# Patient Record
Sex: Male | Born: 1945
Health system: Southern US, Community
[De-identification: ages and names within clinical notes are randomized; demographics above are authoritative.]

## PROBLEM LIST (undated history)

## (undated) DIAGNOSIS — E785 Hyperlipidemia, unspecified: Secondary | ICD-10-CM

## (undated) DIAGNOSIS — K219 Gastro-esophageal reflux disease without esophagitis: Secondary | ICD-10-CM

## (undated) DIAGNOSIS — G629 Polyneuropathy, unspecified: Secondary | ICD-10-CM

## (undated) DIAGNOSIS — M5431 Sciatica, right side: Secondary | ICD-10-CM

## (undated) DIAGNOSIS — A630 Anogenital (venereal) warts: Secondary | ICD-10-CM

## (undated) DIAGNOSIS — C44221 Squamous cell carcinoma of skin of unspecified ear and external auricular canal: Secondary | ICD-10-CM

## (undated) DIAGNOSIS — T7840XA Allergy, unspecified, initial encounter: Secondary | ICD-10-CM

## (undated) DIAGNOSIS — G4730 Sleep apnea, unspecified: Secondary | ICD-10-CM

## (undated) HISTORY — DX: Squamous cell carcinoma of skin of unspecified ear and external auricular canal: C44.221

## (undated) HISTORY — DX: Anogenital (venereal) warts: A63.0

## (undated) HISTORY — DX: Hyperlipidemia, unspecified: E78.5

## (undated) HISTORY — PX: TONSILLECTOMY: SUR1361

## (undated) HISTORY — DX: Polyneuropathy, unspecified: G62.9

## (undated) HISTORY — DX: Gastro-esophageal reflux disease without esophagitis: K21.9

## (undated) HISTORY — DX: Sleep apnea, unspecified: G47.30

## (undated) HISTORY — DX: Allergy, unspecified, initial encounter: T78.40XA

## (undated) HISTORY — DX: Sciatica, right side: M54.31

---

## 2002-09-13 HISTORY — PX: LUMBAR DISC SURGERY: SHX700

## 2003-05-10 ENCOUNTER — Encounter: Admission: RE | Admit: 2003-05-10 | Discharge: 2003-05-10 | Payer: Self-pay | Admitting: Orthopedic Surgery

## 2003-05-10 ENCOUNTER — Encounter: Payer: Self-pay | Admitting: Orthopedic Surgery

## 2003-05-10 ENCOUNTER — Encounter: Payer: Self-pay | Admitting: Radiology

## 2003-05-24 ENCOUNTER — Encounter: Payer: Self-pay | Admitting: Orthopedic Surgery

## 2003-05-24 ENCOUNTER — Encounter: Admission: RE | Admit: 2003-05-24 | Discharge: 2003-05-24 | Payer: Self-pay | Admitting: Orthopedic Surgery

## 2003-06-21 ENCOUNTER — Inpatient Hospital Stay (HOSPITAL_COMMUNITY): Admission: RE | Admit: 2003-06-21 | Discharge: 2003-06-22 | Payer: Self-pay | Admitting: Orthopaedic Surgery

## 2005-06-15 DIAGNOSIS — D099 Carcinoma in situ, unspecified: Secondary | ICD-10-CM

## 2005-06-15 HISTORY — DX: Carcinoma in situ, unspecified: D09.9

## 2005-07-02 ENCOUNTER — Ambulatory Visit: Payer: Self-pay | Admitting: Internal Medicine

## 2005-07-12 ENCOUNTER — Ambulatory Visit: Payer: Self-pay | Admitting: Internal Medicine

## 2005-07-28 ENCOUNTER — Ambulatory Visit: Payer: Self-pay | Admitting: Gastroenterology

## 2005-08-12 ENCOUNTER — Ambulatory Visit: Payer: Self-pay | Admitting: Gastroenterology

## 2005-08-12 HISTORY — PX: COLONOSCOPY: SHX174

## 2005-08-16 ENCOUNTER — Ambulatory Visit: Payer: Self-pay | Admitting: Internal Medicine

## 2006-10-12 ENCOUNTER — Ambulatory Visit: Payer: Self-pay | Admitting: Internal Medicine

## 2006-10-12 LAB — CONVERTED CEMR LAB
ALT: 31 units/L (ref 0–40)
AST: 25 units/L (ref 0–37)
Albumin: 4.1 g/dL (ref 3.5–5.2)
Alkaline Phosphatase: 44 units/L (ref 39–117)
BUN: 10 mg/dL (ref 6–23)
Basophils Absolute: 0.1 10*3/uL (ref 0.0–0.1)
Basophils Relative: 1.8 % — ABNORMAL HIGH (ref 0.0–1.0)
Bilirubin Urine: NEGATIVE
Bilirubin, Direct: 0.1 mg/dL (ref 0.0–0.3)
CO2: 26 meq/L (ref 19–32)
Calcium: 9.4 mg/dL (ref 8.4–10.5)
Chloride: 105 meq/L (ref 96–112)
Cholesterol: 196 mg/dL (ref 0–200)
Creatinine, Ser: 0.9 mg/dL (ref 0.4–1.5)
Direct LDL: 74.1 mg/dL
Eosinophils Absolute: 0.3 10*3/uL (ref 0.0–0.6)
Eosinophils Relative: 5.3 % — ABNORMAL HIGH (ref 0.0–5.0)
GFR calc Af Amer: 111 mL/min
GFR calc non Af Amer: 92 mL/min
Glucose, Bld: 102 mg/dL — ABNORMAL HIGH (ref 70–99)
HCT: 43.1 % (ref 39.0–52.0)
HDL: 59.6 mg/dL (ref >39.0)
Hemoglobin, Urine: NEGATIVE
Hemoglobin: 15.3 g/dL (ref 13.0–17.0)
Ketones, ur: NEGATIVE mg/dL
Leukocytes, UA: NEGATIVE
Lymphocytes Relative: 32.9 % (ref 12.0–46.0)
MCHC: 35.5 g/dL (ref 30.0–36.0)
MCV: 96.2 fL (ref 78.0–100.0)
Monocytes Absolute: 0.8 10*3/uL — ABNORMAL HIGH (ref 0.2–0.7)
Monocytes Relative: 15.2 % — ABNORMAL HIGH (ref 3.0–11.0)
Neutro Abs: 2.4 10*3/uL (ref 1.4–7.7)
Neutrophils Relative %: 44.8 % (ref 43.0–77.0)
Nitrite: NEGATIVE
PSA: 0.92 ng/mL (ref 0.10–4.00)
Platelets: 287 10*3/uL (ref 150–400)
Potassium: 4.3 meq/L (ref 3.5–5.1)
RBC: 4.48 M/uL (ref 4.22–5.81)
RDW: 12.3 % (ref 11.5–14.6)
Sodium: 138 meq/L (ref 135–145)
Specific Gravity, Urine: 1.025 (ref 1.000–1.03)
TSH: 1.94 microintl units/mL (ref 0.35–5.50)
Total Bilirubin: 0.4 mg/dL (ref 0.3–1.2)
Total CHOL/HDL Ratio: 3.3
Total Protein, Urine: NEGATIVE mg/dL
Total Protein: 6.9 g/dL (ref 6.0–8.3)
Triglycerides: 449 mg/dL (ref 0–149)
Urine Glucose: NEGATIVE mg/dL
Urobilinogen, UA: 0.2 (ref 0.0–1.0)
VLDL: 90 mg/dL — ABNORMAL HIGH (ref 0–40)
WBC: 5.3 10*3/uL (ref 4.5–10.5)
pH: 6 (ref 5.0–8.0)

## 2006-10-19 ENCOUNTER — Ambulatory Visit: Payer: Self-pay | Admitting: Internal Medicine

## 2006-11-01 ENCOUNTER — Ambulatory Visit: Payer: Self-pay | Admitting: Internal Medicine

## 2006-11-01 LAB — CONVERTED CEMR LAB
Cholesterol: 173 mg/dL (ref 0–200)
HDL: 54.5 mg/dL (ref >39.0)
LDL Cholesterol: 89 mg/dL (ref 0–99)
Total CHOL/HDL Ratio: 3.2
Triglycerides: 148 mg/dL (ref 0–149)
VLDL: 30 mg/dL (ref 0–40)

## 2007-10-17 ENCOUNTER — Ambulatory Visit: Payer: Self-pay | Admitting: Internal Medicine

## 2007-10-17 LAB — CONVERTED CEMR LAB
ALT: 61 units/L — ABNORMAL HIGH (ref 0–53)
AST: 44 units/L — ABNORMAL HIGH (ref 0–37)
Albumin: 4.2 g/dL (ref 3.5–5.2)
Alkaline Phosphatase: 49 units/L (ref 39–117)
BUN: 10 mg/dL (ref 6–23)
Basophils Absolute: 0.1 10*3/uL (ref 0.0–0.1)
Basophils Relative: 0.8 % (ref 0.0–1.0)
Bilirubin Urine: NEGATIVE
Bilirubin, Direct: 0.3 mg/dL (ref 0.0–0.3)
CO2: 30 meq/L (ref 19–32)
Calcium: 9.5 mg/dL (ref 8.4–10.5)
Chloride: 102 meq/L (ref 96–112)
Cholesterol: 175 mg/dL (ref 0–200)
Creatinine, Ser: 1 mg/dL (ref 0.4–1.5)
Direct LDL: 83.6 mg/dL
Eosinophils Absolute: 0.4 10*3/uL (ref 0.0–0.6)
Eosinophils Relative: 5.7 % — ABNORMAL HIGH (ref 0.0–5.0)
GFR calc Af Amer: 98 mL/min
GFR calc non Af Amer: 81 mL/min
Glucose, Bld: 103 mg/dL — ABNORMAL HIGH (ref 70–99)
HCT: 43.4 % (ref 39.0–52.0)
HDL: 62.7 mg/dL (ref >39.0)
Hemoglobin, Urine: NEGATIVE
Hemoglobin: 14.8 g/dL (ref 13.0–17.0)
Ketones, ur: NEGATIVE mg/dL
Leukocytes, UA: NEGATIVE
Lymphocytes Relative: 27.8 % (ref 12.0–46.0)
MCHC: 34 g/dL (ref 30.0–36.0)
MCV: 97.6 fL (ref 78.0–100.0)
Monocytes Absolute: 0.9 10*3/uL — ABNORMAL HIGH (ref 0.2–0.7)
Monocytes Relative: 13.4 % — ABNORMAL HIGH (ref 3.0–11.0)
Neutro Abs: 3.7 10*3/uL (ref 1.4–7.7)
Neutrophils Relative %: 52.3 % (ref 43.0–77.0)
Nitrite: NEGATIVE
PSA: 0.87 ng/mL (ref 0.10–4.00)
Platelets: 332 10*3/uL (ref 150–400)
Potassium: 4.5 meq/L (ref 3.5–5.1)
RBC: 4.45 M/uL (ref 4.22–5.81)
RDW: 11.9 % (ref 11.5–14.6)
Sodium: 138 meq/L (ref 135–145)
Specific Gravity, Urine: 1.015 (ref 1.000–1.03)
TSH: 2.51 microintl units/mL (ref 0.35–5.50)
Total Bilirubin: 1.4 mg/dL — ABNORMAL HIGH (ref 0.3–1.2)
Total CHOL/HDL Ratio: 2.8
Total Protein, Urine: NEGATIVE mg/dL
Total Protein: 7 g/dL (ref 6.0–8.3)
Triglycerides: 201 mg/dL (ref 0–149)
Urine Glucose: NEGATIVE mg/dL
Urobilinogen, UA: 0.2 (ref 0.0–1.0)
VLDL: 40 mg/dL (ref 0–40)
WBC: 7 10*3/uL (ref 4.5–10.5)
pH: 7 (ref 5.0–8.0)

## 2007-10-24 ENCOUNTER — Ambulatory Visit: Payer: Self-pay | Admitting: Internal Medicine

## 2007-10-24 DIAGNOSIS — J309 Allergic rhinitis, unspecified: Secondary | ICD-10-CM

## 2007-10-24 DIAGNOSIS — E785 Hyperlipidemia, unspecified: Secondary | ICD-10-CM | POA: Insufficient documentation

## 2008-10-16 ENCOUNTER — Ambulatory Visit: Payer: Self-pay | Admitting: Internal Medicine

## 2008-10-16 LAB — CONVERTED CEMR LAB
ALT: 50 units/L (ref 0–53)
AST: 36 units/L (ref 0–37)
Albumin: 4 g/dL (ref 3.5–5.2)
Alkaline Phosphatase: 51 units/L (ref 39–117)
BUN: 10 mg/dL (ref 6–23)
Basophils Absolute: 0.1 10*3/uL (ref 0.0–0.1)
Basophils Relative: 1.2 % (ref 0.0–3.0)
Bilirubin Urine: NEGATIVE
Bilirubin, Direct: 0.2 mg/dL (ref 0.0–0.3)
CO2: 30 meq/L (ref 19–32)
Calcium: 9.3 mg/dL (ref 8.4–10.5)
Chloride: 105 meq/L (ref 96–112)
Cholesterol: 149 mg/dL (ref 0–200)
Creatinine, Ser: 0.9 mg/dL (ref 0.4–1.5)
Eosinophils Absolute: 0.3 10*3/uL (ref 0.0–0.7)
Eosinophils Relative: 4.6 % (ref 0.0–5.0)
GFR calc Af Amer: 110 mL/min
GFR calc non Af Amer: 91 mL/min
Glucose, Bld: 98 mg/dL (ref 70–99)
HCT: 43.9 % (ref 39.0–52.0)
HDL: 60.8 mg/dL (ref >39.0)
Hemoglobin, Urine: NEGATIVE
Hemoglobin: 15.3 g/dL (ref 13.0–17.0)
Ketones, ur: NEGATIVE mg/dL
LDL Cholesterol: 70 mg/dL (ref 0–99)
Leukocytes, UA: NEGATIVE
Lymphocytes Relative: 24.9 % (ref 12.0–46.0)
MCHC: 34.7 g/dL (ref 30.0–36.0)
MCV: 98.4 fL (ref 78.0–100.0)
Monocytes Absolute: 0.8 10*3/uL (ref 0.1–1.0)
Monocytes Relative: 12.9 % — ABNORMAL HIGH (ref 3.0–12.0)
Neutro Abs: 3.7 10*3/uL (ref 1.4–7.7)
Neutrophils Relative %: 56.4 % (ref 43.0–77.0)
Nitrite: NEGATIVE
PSA: 0.8 ng/mL (ref 0.10–4.00)
Platelets: 284 10*3/uL (ref 150–400)
Potassium: 4.5 meq/L (ref 3.5–5.1)
RBC: 4.47 M/uL (ref 4.22–5.81)
RDW: 12.4 % (ref 11.5–14.6)
Sodium: 140 meq/L (ref 135–145)
Specific Gravity, Urine: 1.01 (ref 1.000–1.03)
TSH: 2.9 microintl units/mL (ref 0.35–5.50)
Total Bilirubin: 1 mg/dL (ref 0.3–1.2)
Total CHOL/HDL Ratio: 2.5
Total Protein, Urine: NEGATIVE mg/dL
Total Protein: 6.5 g/dL (ref 6.0–8.3)
Triglycerides: 92 mg/dL (ref 0–149)
Urine Glucose: NEGATIVE mg/dL
Urobilinogen, UA: 0.2 (ref 0.0–1.0)
VLDL: 18 mg/dL (ref 0–40)
WBC: 6.5 10*3/uL (ref 4.5–10.5)
pH: 6.5 (ref 5.0–8.0)

## 2008-10-24 ENCOUNTER — Ambulatory Visit: Payer: Self-pay | Admitting: Internal Medicine

## 2010-01-13 ENCOUNTER — Ambulatory Visit: Payer: Self-pay | Admitting: Internal Medicine

## 2010-01-13 LAB — CONVERTED CEMR LAB
ALT: 108 units/L — ABNORMAL HIGH (ref 0–53)
AST: 66 units/L — ABNORMAL HIGH (ref 0–37)
Albumin: 4 g/dL (ref 3.5–5.2)
Alkaline Phosphatase: 61 units/L (ref 39–117)
BUN: 12 mg/dL (ref 6–23)
Basophils Absolute: 0.1 10*3/uL (ref 0.0–0.1)
Basophils Relative: 0.5 % (ref 0.0–3.0)
Bilirubin Urine: NEGATIVE
Bilirubin, Direct: 0.2 mg/dL (ref 0.0–0.3)
CO2: 29 meq/L (ref 19–32)
Calcium: 9.4 mg/dL (ref 8.4–10.5)
Chloride: 103 meq/L (ref 96–112)
Cholesterol: 168 mg/dL (ref 0–200)
Creatinine, Ser: 0.8 mg/dL (ref 0.4–1.5)
Eosinophils Absolute: 0.1 10*3/uL (ref 0.0–0.7)
Eosinophils Relative: 0.7 % (ref 0.0–5.0)
GFR calc non Af Amer: 103.95 mL/min (ref >60)
Glucose, Bld: 85 mg/dL (ref 70–99)
HCT: 44.3 % (ref 39.0–52.0)
HDL: 71.9 mg/dL (ref >39.00)
Hemoglobin, Urine: NEGATIVE
Hemoglobin: 15.3 g/dL (ref 13.0–17.0)
Ketones, ur: NEGATIVE mg/dL
LDL Cholesterol: 69 mg/dL (ref 0–99)
Leukocytes, UA: NEGATIVE
Lymphocytes Relative: 17.2 % (ref 12.0–46.0)
Lymphs Abs: 2.1 10*3/uL (ref 0.7–4.0)
MCHC: 34.5 g/dL (ref 30.0–36.0)
MCV: 98.9 fL (ref 78.0–100.0)
Monocytes Absolute: 1.2 10*3/uL — ABNORMAL HIGH (ref 0.1–1.0)
Monocytes Relative: 9.7 % (ref 3.0–12.0)
Neutro Abs: 8.9 10*3/uL — ABNORMAL HIGH (ref 1.4–7.7)
Neutrophils Relative %: 71.9 % (ref 43.0–77.0)
Nitrite: NEGATIVE
PSA: 0.9 ng/mL (ref 0.10–4.00)
Platelets: 308 10*3/uL (ref 150.0–400.0)
Potassium: 4.4 meq/L (ref 3.5–5.1)
RBC: 4.49 M/uL (ref 4.22–5.81)
RDW: 13.8 % (ref 11.5–14.6)
Sodium: 139 meq/L (ref 135–145)
Specific Gravity, Urine: 1.02 (ref 1.000–1.030)
TSH: 1.57 microintl units/mL (ref 0.35–5.50)
Total Bilirubin: 0.9 mg/dL (ref 0.3–1.2)
Total CHOL/HDL Ratio: 2
Total Protein, Urine: NEGATIVE mg/dL
Total Protein: 6.6 g/dL (ref 6.0–8.3)
Triglycerides: 137 mg/dL (ref 0.0–149.0)
Urine Glucose: NEGATIVE mg/dL
Urobilinogen, UA: 0.2 (ref 0.0–1.0)
VLDL: 27.4 mg/dL (ref 0.0–40.0)
WBC: 12.4 10*3/uL — ABNORMAL HIGH (ref 4.5–10.5)
pH: 6 (ref 5.0–8.0)

## 2010-01-19 ENCOUNTER — Ambulatory Visit: Payer: Self-pay | Admitting: Internal Medicine

## 2010-06-04 ENCOUNTER — Telehealth: Payer: Self-pay | Admitting: Internal Medicine

## 2010-06-04 DIAGNOSIS — G4733 Obstructive sleep apnea (adult) (pediatric): Secondary | ICD-10-CM

## 2010-09-29 ENCOUNTER — Encounter: Payer: Self-pay | Admitting: Internal Medicine

## 2010-10-13 NOTE — Assessment & Plan Note (Signed)
Summary: CPX/BCBS/#/CD   Vital Signs:  Patient profile:   65 year old male Height:      68 inches Weight:      154.50 pounds BMI:     23.58 O2 Sat:      97 % on Room air Temp:     98.4 degrees F oral Pulse rate:   67 / minute BP sitting:   132 / 84  (left arm) Cuff size:   regular  Vitals Entered ByZella Ball Ewing (Jan 19, 2010 10:47 AM)  O2 Flow:  Room air  CC: Adult Physical/RE   CC:  Adult Physical/RE.  History of Present Illness: overall doing well, did have episode of bronchitis tx with zpack per allergy (eugene Simms MD) now much improved with slight residual nonprod cough only ; no productivity, wheezing, and Pt denies CP, sob, doe, wheezing, orthopnea, pnd, worsening LE edema, palps, dizziness or syncope   Pt denies new neuro symptoms such as headache, facial or extremity weakness   Does have still recurrent right sciatica on occasion but not worse in severity or freq.    Problems Prior to Update: 1)  Preventive Health Care  (ICD-V70.0) 2)  Allergic Rhinitis  (ICD-477.9) 3)  Preventive Health Care  (ICD-V70.0) 4)  Hyperlipidemia  (ICD-272.4) 5)  Special Screening Malignant Neoplasm of Prostate  (ICD-V76.44) 6)  Routine General Medical Exam@health  Care Facl  (ICD-V70.0)  Medications Prior to Update: 1)  Neurontin 300 Mg  Caps (Gabapentin) .Marland Kitchen.. 1 By Mouth Three Times A Day 2)  Crestor 40 Mg  Tabs (Rosuvastatin Calcium) .... 1/2 By Mouth Once Daily 3)  Ecotrin Low Strength 81 Mg  Tbec (Aspirin) .... Take 1 Tablet By Mouth Once A Day  Current Medications (verified): 1)  Neurontin 300 Mg  Caps (Gabapentin) .Marland Kitchen.. 1 By Mouth Three Times A Day 2)  Crestor 40 Mg  Tabs (Rosuvastatin Calcium) .... 1/2 By Mouth Once Daily 3)  Ecotrin Low Strength 81 Mg  Tbec (Aspirin) .... Take 1 Tablet By Mouth Once A Day  Allergies (verified): No Known Drug Allergies  Past History:  Past Medical History: Last updated: 10/24/2007 recurrent right sciatica Hyperlipidemia genital  warts hs of squamous cell skin ca right ear Allergic rhinitis  Past Surgical History: Last updated: 10/24/2007 s/p lumbar disc surgury 2004  Family History: Last updated: 10/24/2007 father and brother with high cholesterol mother with lung cancer  Social History: Last updated: 10/24/2007 Single no children work - Community education officer Current Smoker Alcohol use-yes - ? excessive  Risk Factors: Smoking Status: current (10/24/2007)  Review of Systems  The patient denies anorexia, fever, vision loss, decreased hearing, hoarseness, chest pain, syncope, dyspnea on exertion, peripheral edema, prolonged cough, headaches, hemoptysis, abdominal pain, melena, hematochezia, severe indigestion/heartburn, hematuria, incontinence, genital sores, muscle weakness, suspicious skin lesions, difficulty walking, depression, unusual weight change, abnormal bleeding, enlarged lymph nodes, and angioedema.         all otherwise negative per pt -   - only once nocturia   Physical Exam  General:  alert and well-developed.   Head:  normocephalic and atraumatic.   Eyes:  vision grossly intact, pupils equal, and pupils round.   Ears:  R ear normal and L ear normal.   Nose:  no external deformity and no nasal discharge.   Mouth:  no gingival abnormalities and pharynx pink and moist.   Neck:  supple and no masses.   Lungs:  normal respiratory effort and normal breath sounds.   Heart:  normal  rate and regular rhythm.   Abdomen:  soft, non-tender, and normal bowel sounds.   Msk:  no joint tenderness and no joint swelling.   Extremities:  no edema, no erythema  Neurologic:  cranial nerves II-XII intact and strength normal in all extremities.     Impression & Recommendations:  Problem # 1:  Preventive Health Care (ICD-V70.0) Overall doing well, age appropriate education and counseling updated and referral for appropriate preventive services done unless declined, immunizations up to date or declined, diet  counseling done if overweight, urged to quit smoking if smokes , most recent labs reviewed and current ordered if appropriate, ecg reviewed or declined (interpretation per ECG scanned in the EMR if done); information regarding Medicare Prevention requirements given if appropriate; speciality referrals updated as appropriate  Orders: EKG w/ Interpretation (93000)  Complete Medication List: 1)  Neurontin 300 Mg Caps (Gabapentin) .Marland Kitchen.. 1 by mouth three times a day 2)  Crestor 40 Mg Tabs (Rosuvastatin calcium) .... 1/2 by mouth once daily 3)  Ecotrin Low Strength 81 Mg Tbec (Aspirin) .... Take 1 tablet by mouth once a day  Other Orders: Zoster (Shingles) Vaccine Live [phone]) Admin 1st Vaccine (98119)  Patient Instructions: 1)  you had the shingles shot today 2)  Continue all previous medications as before this visit  3)  Please schedule a follow-up appointment in 1 year or sooner if needed Prescriptions: CRESTOR 40 MG  TABS (ROSUVASTATIN CALCIUM) 1 by mouth once daily  #90 x 3   Entered and Authorized by:   Corwin Levins MD   Signed by:   Corwin Levins MD on 01/19/2010   Method used:   Print then Give to Patient   RxID:   [id number] NEURONTIN 300 MG  CAPS (GABAPENTIN) 1 by mouth three times a day  #270 x 3   Entered and Authorized by:   Corwin Levins MD   Signed by:   Corwin Levins MD on 01/19/2010   Method used:   Print then Give to Patient   RxID:   [id number]    Immunizations Administered:  Zostavax # 0:    Vaccine Type: Zostavax    Site: left deltoid    Mfr: Merck    Dose: 0.5 ml    Route: [place]    Given by: Zella Ball Ewing    Exp. Date: 03/2011    Lot #: [id number]    VIS given: 06/25/05 given Jan 19, 2010.    Immunizations Administered:  Zostavax # 0:    Vaccine Type: Zostavax    Site: left deltoid    Mfr: Merck    Dose: 0.5 ml    Route: [place]    Given by: Robin Ewing    Exp. Date: 03/2011    Lot #: [id number]    VIS given: 06/25/05 given Jan 19, 2010.

## 2010-10-13 NOTE — Progress Notes (Signed)
Summary: CPAP request  Phone Note Call from Patient Call back at Home Phone [phone]   Caller: Patient Call For: Corwin Levins MD Summary of Call: Pt is requesting for Dr. Jonny Ruiz to prescribe CPAP. Pt stated that Dr. Corinda Gubler used to prescibed CPAP for him, but now Dr. Corinda Gubler advice him to get his PCP to prescribe CPAP. Please advice. Initial call taken by: Livingston Diones,  June 04, 2010 1:39 PM  Follow-up for Phone Call        I have never prescribed CPAP in the past. Can we refer him to pulm so that proper eval and tx is continued, since it seems Dr Corinda Gubler no longer wants to be involved ? Follow-up by: Corwin Levins MD,  June 04, 2010 5:37 PM  Additional Follow-up for Phone Call Additional follow up Details #1::        Pt advised to call back if referral to pulm is an option. Margaret Pyle, CMA  June 05, 2010 8:24 AM   Per pt's spouse he would like to be referred to [facility] Additional Follow-up by: Margaret Pyle, CMA,  June 09, 2010 8:21 AM  New Problems: OBSTRUCTIVE SLEEP APNEA (ICD-327.23)   Additional Follow-up for Phone Call Additional follow up Details #2::    ok , will do Follow-up by: Corwin Levins MD,  June 09, 2010 12:28 PM  New Problems: OBSTRUCTIVE SLEEP APNEA (ICD-327.23)

## 2010-10-15 NOTE — Consult Note (Signed)
Summary: [facility]  [facility]   Imported By: Lennie Odor 10/07/2010 15:10:07  _____________________________________________________________________  External Attachment:    Type:   Image     Comment:   External Document

## 2011-01-25 ENCOUNTER — Other Ambulatory Visit: Payer: Self-pay

## 2011-01-25 ENCOUNTER — Other Ambulatory Visit: Payer: Self-pay | Admitting: Internal Medicine

## 2011-01-25 DIAGNOSIS — Z0000 Encounter for general adult medical examination without abnormal findings: Secondary | ICD-10-CM

## 2011-01-25 DIAGNOSIS — Z0389 Encounter for observation for other suspected diseases and conditions ruled out: Secondary | ICD-10-CM

## 2011-01-29 ENCOUNTER — Ambulatory Visit: Payer: Self-pay | Admitting: Internal Medicine

## 2011-01-29 NOTE — Op Note (Signed)
   NAME:  Manuel Cruz, Manuel Cruz                           ACCOUNT NO.:  [id number]   MEDICAL RECORD NO.:  [id number]                   PATIENT TYPE:  INP   LOCATION:  2550                                 FACILITY:  [facility]   PHYSICIAN:  Mark C. Ophelia Charter, M.D.                 DATE OF BIRTH:  Jan 07, 1946   DATE OF PROCEDURE:  06/21/2003  DATE OF DISCHARGE:                                 OPERATIVE REPORT   PREOPERATIVE DIAGNOSIS:  Right L4-L5 herniated nucleus pulposus.   POSTOPERATIVE DIAGNOSIS:  Right L4-L5 herniated nucleus pulposus.   PROCEDURE:  Right L4-L5 microdiscectomy.   SURGEON:  Mark C. Ophelia Charter, M.D.   ASSISTANT:  Genene Churn. Denton Meek.   ANESTHESIA:  GOT plus 8 mL of Marcaine to the skin locally.   ESTIMATED BLOOD LOSS:  Minimal.   PROCEDURE:  After induction of general anesthesia with orotracheal  intubation, the patient was placed on the Andrews frame with careful padding  and positioning.  The back was prepped with DuraPrep, the area was scrubbed  with towels, Betadine and Vi-Drape were applied, and laminectomy sheath.  Cross table x-ray was taken with the needle 2-3 mm above the 4-5 disc space.  An incision was made a few mm to the right of the midline at L4-L5.  Taylor  retractors were placed laterally.  The inferior aspect of the lamina was  cleaned of soft tissue with cup curet.  A 3 mm Kerrison was used for  performing a laminotomy.  The lateral gutter had spurs removed.  Thick  ligaments were removed.  The nerve root was directly visualized and  displaced and had slightly higher take off than normal.  The nerve root was  gently retracted over the bulging disc that was more right than central.  The annulus was incised and pieces of disc were removed.  Some degenerative  pieces had slid inferior from the annulus distal to the L5 body and this was  removed.  An Epstein curet was used as well as a hockey stick and after the  degenerative disc material was removed, the hockey  stick was passed 180  degrees free sweep anterior to the dura.  The foramina was enlarged and was  free.  After irrigation with saline solution, the fascia was closed with 0  Vicryl, 2-0 Vicryl in the subcutaneous tissue, skin staple closure.  Marcaine infiltration and transfer to the recovery room.  Instrument count  and needle counts were correct.                                               Mark C. Ophelia Charter, M.D.    MCY/MEDQ  D:  06/21/2003  T:  06/21/2003  Job:  [id number]

## 2011-03-01 ENCOUNTER — Other Ambulatory Visit (INDEPENDENT_AMBULATORY_CARE_PROVIDER_SITE_OTHER): Payer: Self-pay

## 2011-03-01 DIAGNOSIS — Z0000 Encounter for general adult medical examination without abnormal findings: Secondary | ICD-10-CM

## 2011-03-01 DIAGNOSIS — Z0389 Encounter for observation for other suspected diseases and conditions ruled out: Secondary | ICD-10-CM

## 2011-03-01 LAB — LIPID PANEL
Total CHOL/HDL Ratio: 3
Triglycerides: 137 mg/dL (ref 0.0–149.0)

## 2011-03-01 LAB — URINALYSIS
Leukocytes, UA: NEGATIVE
Nitrite: NEGATIVE
Specific Gravity, Urine: 1.025 (ref 1.000–1.030)
pH: 6 (ref 5.0–8.0)

## 2011-03-01 LAB — HEPATIC FUNCTION PANEL
Alkaline Phosphatase: 52 U/L (ref 39–117)
Bilirubin, Direct: 0.2 mg/dL (ref 0.0–0.3)

## 2011-03-01 LAB — CBC WITH DIFFERENTIAL/PLATELET
Basophils Relative: 1 % (ref 0.0–3.0)
Eosinophils Absolute: 0.5 10*3/uL (ref 0.0–0.7)
Hemoglobin: 16 g/dL (ref 13.0–17.0)
Lymphocytes Relative: 33.6 % (ref 12.0–46.0)
MCHC: 35 g/dL (ref 30.0–36.0)
MCV: 99.4 fl (ref 78.0–100.0)
Monocytes Absolute: 1 10*3/uL (ref 0.1–1.0)
Neutro Abs: 3.2 10*3/uL (ref 1.4–7.7)
RBC: 4.62 Mil/uL (ref 4.22–5.81)

## 2011-03-01 LAB — BASIC METABOLIC PANEL
CO2: 27 mEq/L (ref 19–32)
Calcium: 9 mg/dL (ref 8.4–10.5)
Creatinine, Ser: 0.7 mg/dL (ref 0.4–1.5)
Glucose, Bld: 91 mg/dL (ref 70–99)

## 2011-03-04 ENCOUNTER — Encounter: Payer: Self-pay | Admitting: Internal Medicine

## 2011-03-10 ENCOUNTER — Ambulatory Visit (INDEPENDENT_AMBULATORY_CARE_PROVIDER_SITE_OTHER): Payer: Commercial Managed Care - PPO | Admitting: Internal Medicine

## 2011-03-10 ENCOUNTER — Encounter: Payer: Self-pay | Admitting: Internal Medicine

## 2011-03-10 VITALS — BP 122/80 | HR 67 | Temp 98.2°F | Ht 68.0 in | Wt 153.2 lb

## 2011-03-10 DIAGNOSIS — Z0000 Encounter for general adult medical examination without abnormal findings: Secondary | ICD-10-CM | POA: Insufficient documentation

## 2011-03-10 DIAGNOSIS — Z79899 Other long term (current) drug therapy: Secondary | ICD-10-CM | POA: Insufficient documentation

## 2011-03-10 DIAGNOSIS — E785 Hyperlipidemia, unspecified: Secondary | ICD-10-CM

## 2011-03-10 MED ORDER — ATORVASTATIN CALCIUM 40 MG PO TABS: 40.0000 mg | ORAL_TABLET | Freq: Every day | ORAL | Status: DC

## 2011-03-10 NOTE — Assessment & Plan Note (Signed)
crestor too expensive - to change to lipitor 40 mg, f/u labs in 4 wks

## 2011-03-10 NOTE — Progress Notes (Signed)
Subjective:    Patient ID: Manuel Cruz, male    DOB: 02/14/1946, 65 y.o.   MRN: [id number]  HPI Here for wellness and f/u;  Overall doing ok;  Pt denies CP, worsening SOB, DOE, wheezing, orthopnea, PND, worsening LE edema, palpitations, dizziness or syncope.  Pt denies neurological change such as new Headache, facial or extremity weakness.  Pt denies polydipsia, polyuria, or low sugar symptoms. Pt states overall good compliance with treatment and medications, good tolerability, and trying to follow lower cholesterol diet.  Pt denies worsening depressive symptoms, suicidal ideation or panic. No fever, wt loss, night sweats, loss of appetite, or other constitutional symptoms.  Pt states good ability with ADL's, low fall risk, home safety reviewed and adequate, no significant changes in hearing or vision, and occasionally active with exercise.   Father died 27-Feb-2012with bladder ca and NHL at 64yo;  Was seeing Dr Darlyn Read for trach;  Pt himself with occasional ST, felt to be ? Reflux per ENT, now on omeprazole. Also saw derm twice in the past mo, with some keratosis to left side burn area, tx with Picato for 3 night tx only. Past Medical History  Diagnosis Date  . Hyperlipidemia   . Allergic rhinitis   . Sciatica of right side     recurrent  . Warts, genital   . Squamous cell skin cancer, earlobe    Past Surgical History  Procedure Date  . Lumbar disc surgery 2004    reports that he has been smoking.  He does not have any smokeless tobacco history on file. He reports that he drinks alcohol. His drug history not on file. family history includes Cancer in his mother and Hyperlipidemia in his brother and father. No Known Allergies Current Outpatient Prescriptions on File Prior to Visit  Medication Sig Dispense Refill  . aspirin 81 MG EC tablet Take 81 mg by mouth daily.        Marland Kitchen gabapentin (NEURONTIN) 300 MG capsule Take 300 mg by mouth 3 (three) times daily.         Review of Systems Review  of Systems  Constitutional: Negative for diaphoresis, activity change, appetite change and unexpected weight change.  HENT: Negative for hearing loss, ear pain, facial swelling, mouth sores and neck stiffness.   Eyes: Negative for pain, redness and visual disturbance.  Respiratory: Negative for shortness of breath and wheezing.   Cardiovascular: Negative for chest pain and palpitations.  Gastrointestinal: Negative for diarrhea, blood in stool, abdominal distention and rectal pain.  Genitourinary: Negative for hematuria, flank pain and decreased urine volume.  Musculoskeletal: Negative for myalgias and joint swelling.  Skin: Negative for color change and wound.  Neurological: Negative for syncope and numbness.  Hematological: Negative for adenopathy.  Psychiatric/Behavioral: Negative for hallucinations, self-injury, decreased concentration and agitation.      Objective:   Physical Exam BP 122/80  Pulse 67  Temp(Src) 98.2 F (36.8 C) (Oral)  Ht 5\' 8"  (1.727 m)  Wt 153 lb 4 oz (69.514 kg)  BMI 23.30 kg/m2  SpO2 98% Physical Exam  VS noted Constitutional: Pt is oriented to person, place, and time. Appears well-developed and well-nourished.  HENT:  Head: Normocephalic and atraumatic.  Right Ear: External ear normal.  Left Ear: External ear normal.  Nose: Nose normal.  Mouth/Throat: Oropharynx is clear and moist.  Eyes: Conjunctivae and EOM are normal. Pupils are equal, round, and reactive to light.  Neck: Normal range of motion. Neck supple. No JVD present. No  tracheal deviation present.  Cardiovascular: Normal rate, regular rhythm, normal heart sounds and intact distal pulses.   Pulmonary/Chest: Effort normal and breath sounds normal.  Abdominal: Soft. Bowel sounds are normal. There is no tenderness.  Musculoskeletal: Normal range of motion. Exhibits no edema.  Lymphadenopathy:  Has no cervical adenopathy.  Neurological: Pt is alert and oriented to person, place, and time. Pt has  normal reflexes. No cranial nerve deficit.  Skin: Skin is warm and dry. No rash noted.  Psychiatric:  Has  normal mood and affect. Behavior is normal.         Assessment & Plan:

## 2011-03-10 NOTE — Assessment & Plan Note (Signed)

## 2011-03-10 NOTE — Patient Instructions (Signed)
Please finish the crestor you have After that, change to the lipitor as prescribed Please return for LAB only, approx 4 weeks after starting the Lipitor (for cholesterol and liver tests) Please also return in 1 year for your yearly visit, or sooner if needed, with Lab testing done 3-5 days before

## 2011-03-17 ENCOUNTER — Encounter: Payer: Self-pay | Admitting: Internal Medicine

## 2011-03-25 ENCOUNTER — Other Ambulatory Visit: Payer: Self-pay | Admitting: Internal Medicine

## 2012-02-29 ENCOUNTER — Other Ambulatory Visit (INDEPENDENT_AMBULATORY_CARE_PROVIDER_SITE_OTHER): Payer: Commercial Managed Care - PPO

## 2012-02-29 DIAGNOSIS — Z0000 Encounter for general adult medical examination without abnormal findings: Secondary | ICD-10-CM

## 2012-02-29 LAB — LIPID PANEL
HDL: 67.8 mg/dL (ref >39.00)
VLDL: 31.4 mg/dL (ref 0.0–40.0)

## 2012-02-29 LAB — CBC WITH DIFFERENTIAL/PLATELET
Basophils Absolute: 0.1 10*3/uL (ref 0.0–0.1)
Eosinophils Absolute: 0.6 10*3/uL (ref 0.0–0.7)
Eosinophils Relative: 6.2 % — ABNORMAL HIGH (ref 0.0–5.0)
HCT: 45.1 % (ref 39.0–52.0)
Lymphs Abs: 2.9 10*3/uL (ref 0.7–4.0)
MCHC: 33.7 g/dL (ref 30.0–36.0)
MCV: 99.8 fl (ref 78.0–100.0)
Monocytes Absolute: 1.2 10*3/uL — ABNORMAL HIGH (ref 0.1–1.0)
Platelets: 258 10*3/uL (ref 150.0–400.0)
RDW: 13.5 % (ref 11.5–14.6)

## 2012-02-29 LAB — BASIC METABOLIC PANEL
GFR: 109.55 mL/min (ref >60.00)
Glucose, Bld: 94 mg/dL (ref 70–99)
Potassium: 4.6 mEq/L (ref 3.5–5.1)
Sodium: 140 mEq/L (ref 135–145)

## 2012-02-29 LAB — URINALYSIS, ROUTINE W REFLEX MICROSCOPIC
Hgb urine dipstick: NEGATIVE
Nitrite: NEGATIVE
Urobilinogen, UA: 0.2 (ref 0.0–1.0)

## 2012-02-29 LAB — HEPATIC FUNCTION PANEL
ALT: 53 U/L (ref 0–53)
Total Bilirubin: 0.9 mg/dL (ref 0.3–1.2)

## 2012-03-10 ENCOUNTER — Encounter: Payer: Self-pay | Admitting: Internal Medicine

## 2012-03-10 ENCOUNTER — Ambulatory Visit (INDEPENDENT_AMBULATORY_CARE_PROVIDER_SITE_OTHER): Payer: BC Managed Care – PPO | Admitting: Internal Medicine

## 2012-03-10 ENCOUNTER — Telehealth: Payer: Self-pay

## 2012-03-10 VITALS — BP 120/80 | HR 68 | Temp 97.3°F | Ht 68.0 in | Wt 154.1 lb

## 2012-03-10 DIAGNOSIS — Z0000 Encounter for general adult medical examination without abnormal findings: Secondary | ICD-10-CM

## 2012-03-10 DIAGNOSIS — E785 Hyperlipidemia, unspecified: Secondary | ICD-10-CM

## 2012-03-10 MED ORDER — OMEPRAZOLE 20 MG PO CPDR: 20.0000 mg | DELAYED_RELEASE_CAPSULE | Freq: Every day | ORAL | Status: DC

## 2012-03-10 MED ORDER — ATORVASTATIN CALCIUM 40 MG PO TABS: 40.0000 mg | ORAL_TABLET | Freq: Every day | ORAL | Status: DC

## 2012-03-10 MED ORDER — ROSUVASTATIN CALCIUM 20 MG PO TABS: 20.0000 mg | ORAL_TABLET | Freq: Every day | ORAL | Status: DC

## 2012-03-10 NOTE — Progress Notes (Signed)
Subjective:    Patient ID: Manuel Cruz, male    DOB: [birth year], 66 y.o.   MRN: [id number]  HPI    Here for wellness and f/u;  Overall doing ok;  Pt denies CP, worsening SOB, DOE, wheezing, orthopnea, PND, worsening LE edema, palpitations, dizziness or syncope.  Pt denies neurological change such as new Headache, facial or extremity weakness.  Pt denies polydipsia, polyuria, or low sugar symptoms. Pt states overall good compliance with treatment and medications, good tolerability, and trying to follow lower cholesterol diet.  Pt denies worsening depressive symptoms, suicidal ideation or panic. No fever, wt loss, night sweats, loss of appetite, or other constitutional symptoms.  Pt states good ability with ADL's, low fall risk, home safety reviewed and adequate, no significant changes in hearing or vision, and occasionally active with exercise.  Is still taking crestor instead of the lipitor as he had plenty of samples and rx. No acute complaints Past Medical History  Diagnosis Date  . Hyperlipidemia   . Allergic rhinitis   . Sciatica of right side     recurrent  . Warts, genital   . Squamous cell skin cancer, earlobe    Past Surgical History  Procedure Date  . Lumbar disc surgery 2004    reports that he has been smoking.  He does not have any smokeless tobacco history on file. He reports that he drinks alcohol. His drug history not on file. family history includes Cancer in his mother and Hyperlipidemia in his brother and father. No Known Allergies Current Outpatient Prescriptions on File Prior to Visit  Medication Sig Dispense Refill  . aspirin 81 MG EC tablet Take 81 mg by mouth daily.        Marland Kitchen gabapentin (NEURONTIN) 300 MG capsule TAKE 1 CAPSULE 3 TIMES A DAY  270 capsule  2  . atorvastatin (LIPITOR) 40 MG tablet Take 1 tablet (40 mg total) by mouth daily.  90 tablet  3   Review of Systems Review of Systems  Constitutional: Negative for diaphoresis, activity change, appetite change  and unexpected weight change.  HENT: Negative for hearing loss, ear pain, facial swelling, mouth sores and neck stiffness.   Eyes: Negative for pain, redness and visual disturbance.  Respiratory: Negative for shortness of breath and wheezing.   Cardiovascular: Negative for chest pain and palpitations.  Gastrointestinal: Negative for diarrhea, blood in stool, abdominal distention and rectal pain.  Genitourinary: Negative for hematuria, flank pain and decreased urine volume.  Musculoskeletal: Negative for myalgias and joint swelling.  Skin: Negative for color change and wound.  Neurological: Negative for syncope and numbness.  Hematological: Negative for adenopathy.  Psychiatric/Behavioral: Negative for hallucinations, self-injury, decreased concentration and agitation.      Objective:   Physical Exam BP 120/80  Pulse 68  Temp 97.3 F (36.3 C) (Oral)  Ht 5\' 8"  (1.727 m)  Wt 154 lb 2 oz (69.911 kg)  BMI 23.43 kg/m2  SpO2 98% Physical Exam  VS noted Constitutional: Pt is oriented to person, place, and time. Appears well-developed and well-nourished.  HENT:  Head: Normocephalic and atraumatic.  Right Ear: External ear normal.  Left Ear: External ear normal.  Nose: Nose normal.  Mouth/Throat: Oropharynx is clear and moist.  Eyes: Conjunctivae and EOM are normal. Pupils are equal, round, and reactive to light.  Neck: Normal range of motion. Neck supple. No JVD present. No tracheal deviation present.  Cardiovascular: Normal rate, regular rhythm, normal heart sounds and intact distal pulses.   Pulmonary/Chest:  Effort normal and breath sounds normal.  Abdominal: Soft. Bowel sounds are normal. There is no tenderness.  Musculoskeletal: Normal range of motion. Exhibits no edema.  Lymphadenopathy:  Has no cervical adenopathy.  Neurological: Pt is alert and oriented to person, place, and time. Pt has normal reflexes. No cranial nerve deficit. Motor/gait intact Skin: Skin is warm and dry. No  rash noted.  Psychiatric:  Has  normal mood and affect. Behavior is normal.     Assessment & Plan:

## 2012-03-10 NOTE — Patient Instructions (Addendum)
Continue all other medications as before Your refills were sent in You are otherwise up to date with prevention Please return in 1 year for your yearly visit, or sooner if needed, with Lab testing done 3-5 days before

## 2012-03-10 NOTE — Assessment & Plan Note (Signed)

## 2012-03-10 NOTE — Telephone Encounter (Signed)
Ok to change to lipitor 40 if ok with insurance

## 2012-03-10 NOTE — Telephone Encounter (Signed)
Received PA for Crestor.  Please advise to proceed or offer alternative

## 2012-03-11 ENCOUNTER — Encounter: Payer: Self-pay | Admitting: Internal Medicine

## 2012-03-13 NOTE — Telephone Encounter (Signed)
PATIENT INFORMED

## 2012-03-13 NOTE — Telephone Encounter (Signed)
Called the patient to inform--left message to call back.  

## 2012-06-04 ENCOUNTER — Other Ambulatory Visit: Payer: Self-pay | Admitting: Internal Medicine

## 2012-06-05 NOTE — Telephone Encounter (Signed)
Done per erx 

## 2012-07-14 ENCOUNTER — Other Ambulatory Visit: Payer: Self-pay

## 2012-07-14 DIAGNOSIS — E785 Hyperlipidemia, unspecified: Secondary | ICD-10-CM

## 2012-07-14 MED ORDER — OMEPRAZOLE 20 MG PO CPDR: 20.0000 mg | DELAYED_RELEASE_CAPSULE | Freq: Every day | ORAL | Status: DC

## 2012-07-14 MED ORDER — GABAPENTIN 300 MG PO CAPS: ORAL_CAPSULE | ORAL | Status: DC

## 2012-07-14 MED ORDER — ATORVASTATIN CALCIUM 40 MG PO TABS: 40.0000 mg | ORAL_TABLET | Freq: Every day | ORAL | Status: DC

## 2012-07-14 NOTE — Telephone Encounter (Signed)
Done erx 

## 2012-07-18 DIAGNOSIS — A499 Bacterial infection, unspecified: Secondary | ICD-10-CM | POA: Diagnosis not present

## 2012-07-18 DIAGNOSIS — L723 Sebaceous cyst: Secondary | ICD-10-CM | POA: Diagnosis not present

## 2012-11-30 ENCOUNTER — Other Ambulatory Visit: Payer: Self-pay

## 2012-11-30 MED ORDER — GABAPENTIN 300 MG PO CAPS: ORAL_CAPSULE | ORAL | Status: DC

## 2012-11-30 NOTE — Telephone Encounter (Signed)
Done erx 

## 2013-01-09 DIAGNOSIS — H2510 Age-related nuclear cataract, unspecified eye: Secondary | ICD-10-CM | POA: Diagnosis not present

## 2013-03-14 DIAGNOSIS — L821 Other seborrheic keratosis: Secondary | ICD-10-CM | POA: Diagnosis not present

## 2013-03-14 DIAGNOSIS — D239 Other benign neoplasm of skin, unspecified: Secondary | ICD-10-CM | POA: Diagnosis not present

## 2013-03-14 DIAGNOSIS — L570 Actinic keratosis: Secondary | ICD-10-CM | POA: Diagnosis not present

## 2013-03-15 ENCOUNTER — Ambulatory Visit (INDEPENDENT_AMBULATORY_CARE_PROVIDER_SITE_OTHER): Payer: BC Managed Care – PPO | Admitting: Internal Medicine

## 2013-03-15 ENCOUNTER — Encounter: Payer: Self-pay | Admitting: Internal Medicine

## 2013-03-15 VITALS — BP 122/88 | HR 68 | Temp 98.1°F | Ht 68.0 in | Wt 158.0 lb

## 2013-03-15 DIAGNOSIS — J309 Allergic rhinitis, unspecified: Secondary | ICD-10-CM

## 2013-03-15 DIAGNOSIS — M5430 Sciatica, unspecified side: Secondary | ICD-10-CM

## 2013-03-15 DIAGNOSIS — T753XXA Motion sickness, initial encounter: Secondary | ICD-10-CM

## 2013-03-15 DIAGNOSIS — M5431 Sciatica, right side: Secondary | ICD-10-CM

## 2013-03-15 DIAGNOSIS — Z136 Encounter for screening for cardiovascular disorders: Secondary | ICD-10-CM

## 2013-03-15 DIAGNOSIS — N320 Bladder-neck obstruction: Secondary | ICD-10-CM

## 2013-03-15 DIAGNOSIS — Z0000 Encounter for general adult medical examination without abnormal findings: Secondary | ICD-10-CM

## 2013-03-15 DIAGNOSIS — Z23 Encounter for immunization: Secondary | ICD-10-CM

## 2013-03-15 DIAGNOSIS — F1090 Alcohol use, unspecified, uncomplicated: Secondary | ICD-10-CM

## 2013-03-15 DIAGNOSIS — E785 Hyperlipidemia, unspecified: Secondary | ICD-10-CM

## 2013-03-15 DIAGNOSIS — Z7289 Other problems related to lifestyle: Secondary | ICD-10-CM | POA: Insufficient documentation

## 2013-03-15 DIAGNOSIS — F1010 Alcohol abuse, uncomplicated: Secondary | ICD-10-CM

## 2013-03-15 DIAGNOSIS — Z789 Other specified health status: Secondary | ICD-10-CM | POA: Insufficient documentation

## 2013-03-15 MED ORDER — OMEPRAZOLE 20 MG PO CPDR: 20.0000 mg | DELAYED_RELEASE_CAPSULE | Freq: Every day | ORAL | Status: DC

## 2013-03-15 MED ORDER — GABAPENTIN 300 MG PO CAPS: ORAL_CAPSULE | ORAL | Status: DC

## 2013-03-15 MED ORDER — ALPRAZOLAM 0.25 MG PO TABS: 0.2500 mg | ORAL_TABLET | Freq: Two times a day (BID) | ORAL | Status: DC | PRN

## 2013-03-15 MED ORDER — ATORVASTATIN CALCIUM 40 MG PO TABS: 40.0000 mg | ORAL_TABLET | Freq: Every day | ORAL | Status: DC

## 2013-03-15 NOTE — Assessment & Plan Note (Signed)
stable overall by history and exam, recent data reviewed with pt, and pt to continue medical treatment as before,  to f/u any worsening symptoms or concerns Lab Results  Component Value Date   WBC 8.9 02/29/2012   HGB 15.2 02/29/2012   HCT 45.1 02/29/2012   PLT 258.0 02/29/2012   GLUCOSE 94 02/29/2012   CHOL 181 02/29/2012   TRIG 157.0* 02/29/2012   HDL 67.80 02/29/2012   LDLDIRECT 83.6 10/17/2007   LDLCALC 82 02/29/2012   ALT 53 02/29/2012   AST 42* 02/29/2012   NA 140 02/29/2012   K 4.6 02/29/2012   CL 105 02/29/2012   CREATININE 0.8 02/29/2012   BUN 15 02/29/2012   CO2 27 02/29/2012   TSH 3.70 02/29/2012   PSA 1.00 02/29/2012

## 2013-03-15 NOTE — Patient Instructions (Signed)
Please continue all other medications as before, and refills have been done if requested - the gabapentin Please have the pharmacy call with any other refills you may need. You had the pneumonia shot today Please continue your efforts at being more active, low cholesterol diet, and weight control. You are otherwise up to date with prevention measures today. Please go to the LAB in the Basement (turn left off the elevator) for the tests to be done today You will be contacted by phone if any changes need to be made immediately.  Otherwise, you will receive a letter about your results with an explanation, but please check with MyChart first.  Please remember to sign up for My Chart if you have not done so, as this will be important to you in the future with finding out test results, communicating by private email, and scheduling acute appointments online when needed.  Please return in 1 year for your yearly visit, or sooner if needed

## 2013-03-15 NOTE — Addendum Note (Signed)
Addended by: Scharlene Gloss B on: 03/15/2013 11:07 AM   Modules accepted: Orders

## 2013-03-15 NOTE — Assessment & Plan Note (Signed)
stable overall by history and exam, recent data reviewed with pt, and pt to continue medical treatment as before,  to f/u any worsening symptoms or concerns, for gabapentin refill

## 2013-03-15 NOTE — Assessment & Plan Note (Addendum)
Worsened recently, vague on total use, but prob less than 1 bottle wine per day, urged to abstain  Note:  Total time for pt hx, exam, review of record with pt in the room, determination of diagnoses and plan for further eval and tx is > 40 min, with over 50% spent in coordination and counseling of patient

## 2013-03-15 NOTE — Assessment & Plan Note (Signed)
Not charged, but due for pneumovax - will do

## 2013-03-15 NOTE — Assessment & Plan Note (Addendum)
ECG reviewed as per emr, o/w stable overall by history and exam, recent data reviewed with pt, and pt to continue medical treatment as before,  to f/u any worsening symptoms or concerns Lab Results  Component Value Date   LDLCALC 82 02/29/2012

## 2013-03-15 NOTE — Progress Notes (Signed)
  Subjective:    Patient ID: Manuel Cruz, male    DOB: 25-Nov-1945, 67 y.o.   MRN: [id number]  HPI  Here to f/u; overall doing ok,  Pt denies chest pain, increased sob or doe, wheezing, orthopnea, PND, increased LE swelling, palpitations, dizziness or syncope.  Pt denies polydipsia, polyuria, or low sugar symptoms such as weakness or confusion improved with po intake.  Pt denies new neurological symptoms such as new headache, or facial or extremity weakness or numbness.   Pt states overall good compliance with meds, has been trying to follow lower cholesterol diet, with wt overall stable,  but little exercise however.  Has been drinking more lately, wine only but less than 1 bottle per day.  Pt continues to have recurring right LBP without change in severity, bowel or bladder change, fever, wt loss,  worsening LE pain/numbness/weakness, gait change or falls, better with gabapentin.  Flying to [place] with fear of travel, asks for med for this Past Medical History  Diagnosis Date  . Hyperlipidemia   . Allergic rhinitis   . Sciatica of right side     recurrent  . Warts, genital   . Squamous cell skin cancer, earlobe    Past Surgical History  Procedure Laterality Date  . Lumbar disc surgery  2004    reports that he has been smoking.  He does not have any smokeless tobacco history on file. He reports that  drinks alcohol. His drug history is not on file. family history includes Cancer in his mother and Hyperlipidemia in his brother and father. No Known Allergies Current Outpatient Prescriptions on File Prior to Visit  Medication Sig Dispense Refill  . aspirin 81 MG EC tablet Take 81 mg by mouth daily.        Marland Kitchen gabapentin (NEURONTIN) 300 MG capsule TAKE 1 CAPSULE 3 TIMES A DAY  270 capsule  1   No current facility-administered medications on file prior to visit.   Review of Systems  Constitutional: Negative for unexpected weight change, or unusual diaphoresis  HENT: Negative for tinnitus.    Eyes: Negative for photophobia and visual disturbance.  Respiratory: Negative for choking and stridor.   Gastrointestinal: Negative for vomiting and blood in stool.  Genitourinary: Negative for hematuria and decreased urine volume.  Musculoskeletal: Negative for acute joint swelling Skin: Negative for color change and wound.  Neurological: Negative for tremors and numbness other than noted  Psychiatric/Behavioral: Negative for decreased concentration or  hyperactivity.       Objective:   Physical Exam BP 122/88  Pulse 68  Temp(Src) 98.1 F (36.7 C) (Oral)  Ht 5\' 8"  (1.727 m)  Wt 158 lb (71.668 kg)  BMI 24.03 kg/m2  SpO2 98% VS noted,  Constitutional: Pt appears well-developed and well-nourished.  HENT: Head: NCAT.  Right Ear: External ear normal.  Left Ear: External ear normal.  Eyes: Conjunctivae and EOM are normal. Pupils are equal, round, and reactive to light.  Neck: Normal range of motion. Neck supple.  Cardiovascular: Normal rate and regular rhythm.   Pulmonary/Chest: Effort normal and breath sounds normal.  Abd:  Soft, NT, non-distended, + BS Neurological: Pt is alert. Not confused , motor 5/5, gait intact Skin: Skin is warm. No erythema.  Psychiatric: Pt behavior is normal. Thought content normal. mild nervous    Assessment & Plan:

## 2013-03-19 ENCOUNTER — Other Ambulatory Visit (INDEPENDENT_AMBULATORY_CARE_PROVIDER_SITE_OTHER): Payer: BC Managed Care – PPO

## 2013-03-19 ENCOUNTER — Encounter: Payer: Self-pay | Admitting: Internal Medicine

## 2013-03-19 DIAGNOSIS — N320 Bladder-neck obstruction: Secondary | ICD-10-CM | POA: Diagnosis not present

## 2013-03-19 DIAGNOSIS — E785 Hyperlipidemia, unspecified: Secondary | ICD-10-CM

## 2013-03-19 LAB — CBC WITH DIFFERENTIAL/PLATELET
Basophils Absolute: 0.1 10*3/uL (ref 0.0–0.1)
Eosinophils Relative: 6 % — ABNORMAL HIGH (ref 0.0–5.0)
HCT: 44.7 % (ref 39.0–52.0)
Lymphocytes Relative: 33.8 % (ref 12.0–46.0)
Lymphs Abs: 2.9 10*3/uL (ref 0.7–4.0)
Monocytes Relative: 12.6 % — ABNORMAL HIGH (ref 3.0–12.0)
Platelets: 287 10*3/uL (ref 150.0–400.0)
WBC: 8.6 10*3/uL (ref 4.5–10.5)

## 2013-03-19 LAB — URINALYSIS, ROUTINE W REFLEX MICROSCOPIC
Hgb urine dipstick: NEGATIVE
Leukocytes, UA: NEGATIVE
Nitrite: NEGATIVE
Specific Gravity, Urine: >=1.03 (ref 1.000–1.030)
Urine Glucose: NEGATIVE
Urobilinogen, UA: 0.2 (ref 0.0–1.0)

## 2013-03-19 LAB — BASIC METABOLIC PANEL
BUN: 12 mg/dL (ref 6–23)
CO2: 27 mEq/L (ref 19–32)
Calcium: 9.7 mg/dL (ref 8.4–10.5)
GFR: 102.91 mL/min (ref >60.00)
Glucose, Bld: 96 mg/dL (ref 70–99)
Potassium: 4.2 mEq/L (ref 3.5–5.1)

## 2013-03-19 LAB — LIPID PANEL
HDL: 68.3 mg/dL (ref >39.00)
Triglycerides: 303 mg/dL — ABNORMAL HIGH (ref 0.0–149.0)

## 2013-03-19 LAB — HEPATIC FUNCTION PANEL
AST: 45 U/L — ABNORMAL HIGH (ref 0–37)
Albumin: 4.2 g/dL (ref 3.5–5.2)
Total Protein: 7.1 g/dL (ref 6.0–8.3)

## 2013-03-20 ENCOUNTER — Encounter: Payer: Self-pay | Admitting: Internal Medicine

## 2013-03-20 NOTE — Telephone Encounter (Signed)
Zella Ball - see above note

## 2013-04-18 ENCOUNTER — Other Ambulatory Visit: Payer: Self-pay

## 2013-05-03 ENCOUNTER — Other Ambulatory Visit: Payer: Self-pay

## 2013-05-03 ENCOUNTER — Other Ambulatory Visit: Payer: Self-pay | Admitting: *Deleted

## 2013-05-03 DIAGNOSIS — E785 Hyperlipidemia, unspecified: Secondary | ICD-10-CM

## 2013-05-03 MED ORDER — OMEPRAZOLE 20 MG PO CPDR: 20.0000 mg | DELAYED_RELEASE_CAPSULE | Freq: Every day | ORAL | Status: DC

## 2013-05-03 MED ORDER — ATORVASTATIN CALCIUM 40 MG PO TABS: 40.0000 mg | ORAL_TABLET | Freq: Every day | ORAL | Status: DC

## 2013-05-03 MED ORDER — GABAPENTIN 300 MG PO CAPS: ORAL_CAPSULE | ORAL | Status: DC

## 2013-05-07 ENCOUNTER — Telehealth: Payer: Self-pay | Admitting: Internal Medicine

## 2013-05-07 DIAGNOSIS — E785 Hyperlipidemia, unspecified: Secondary | ICD-10-CM

## 2013-05-07 MED ORDER — ATORVASTATIN CALCIUM 40 MG PO TABS: 40.0000 mg | ORAL_TABLET | Freq: Every day | ORAL | Status: DC

## 2013-05-07 MED ORDER — OMEPRAZOLE 20 MG PO CPDR: 20.0000 mg | DELAYED_RELEASE_CAPSULE | Freq: Every day | ORAL | Status: DC

## 2013-05-07 NOTE — Telephone Encounter (Signed)
Refills recent as requested

## 2013-05-07 NOTE — Telephone Encounter (Signed)
Per pt [facility] contacted him last Friday saying they have not received a reply for his refills, he would like [place] to re-fax his refills to this number [phone]

## 2013-06-11 DIAGNOSIS — Z23 Encounter for immunization: Secondary | ICD-10-CM | POA: Diagnosis not present

## 2013-07-19 ENCOUNTER — Other Ambulatory Visit: Payer: Self-pay

## 2013-09-14 ENCOUNTER — Other Ambulatory Visit: Payer: Self-pay

## 2013-09-14 MED ORDER — GABAPENTIN 300 MG PO CAPS: ORAL_CAPSULE | ORAL | Status: DC

## 2014-01-15 DIAGNOSIS — H2510 Age-related nuclear cataract, unspecified eye: Secondary | ICD-10-CM | POA: Diagnosis not present

## 2014-01-30 ENCOUNTER — Other Ambulatory Visit: Payer: Self-pay

## 2014-01-30 MED ORDER — GABAPENTIN 300 MG PO CAPS: ORAL_CAPSULE | ORAL | Status: DC

## 2014-01-30 NOTE — Telephone Encounter (Signed)
Done erx 

## 2014-03-25 DIAGNOSIS — L820 Inflamed seborrheic keratosis: Secondary | ICD-10-CM | POA: Diagnosis not present

## 2014-03-25 DIAGNOSIS — D485 Neoplasm of uncertain behavior of skin: Secondary | ICD-10-CM | POA: Diagnosis not present

## 2014-03-25 DIAGNOSIS — L819 Disorder of pigmentation, unspecified: Secondary | ICD-10-CM | POA: Diagnosis not present

## 2014-03-25 DIAGNOSIS — D239 Other benign neoplasm of skin, unspecified: Secondary | ICD-10-CM | POA: Diagnosis not present

## 2014-04-10 ENCOUNTER — Other Ambulatory Visit (INDEPENDENT_AMBULATORY_CARE_PROVIDER_SITE_OTHER): Payer: Medicare Other

## 2014-04-10 ENCOUNTER — Ambulatory Visit (INDEPENDENT_AMBULATORY_CARE_PROVIDER_SITE_OTHER): Payer: Medicare Other | Admitting: Internal Medicine

## 2014-04-10 ENCOUNTER — Encounter: Payer: Self-pay | Admitting: Internal Medicine

## 2014-04-10 VITALS — BP 110/72 | HR 73 | Temp 97.7°F | Ht 68.0 in | Wt 156.2 lb

## 2014-04-10 DIAGNOSIS — Z136 Encounter for screening for cardiovascular disorders: Secondary | ICD-10-CM | POA: Diagnosis not present

## 2014-04-10 DIAGNOSIS — N320 Bladder-neck obstruction: Secondary | ICD-10-CM

## 2014-04-10 DIAGNOSIS — F3289 Other specified depressive episodes: Secondary | ICD-10-CM

## 2014-04-10 DIAGNOSIS — Z0000 Encounter for general adult medical examination without abnormal findings: Secondary | ICD-10-CM | POA: Diagnosis not present

## 2014-04-10 DIAGNOSIS — F1090 Alcohol use, unspecified, uncomplicated: Secondary | ICD-10-CM

## 2014-04-10 DIAGNOSIS — E785 Hyperlipidemia, unspecified: Secondary | ICD-10-CM

## 2014-04-10 DIAGNOSIS — Z23 Encounter for immunization: Secondary | ICD-10-CM | POA: Diagnosis not present

## 2014-04-10 DIAGNOSIS — F32A Depression, unspecified: Secondary | ICD-10-CM

## 2014-04-10 DIAGNOSIS — Z7289 Other problems related to lifestyle: Secondary | ICD-10-CM

## 2014-04-10 DIAGNOSIS — F329 Major depressive disorder, single episode, unspecified: Secondary | ICD-10-CM

## 2014-04-10 DIAGNOSIS — F17200 Nicotine dependence, unspecified, uncomplicated: Secondary | ICD-10-CM

## 2014-04-10 DIAGNOSIS — F1010 Alcohol abuse, uncomplicated: Secondary | ICD-10-CM

## 2014-04-10 LAB — LIPID PANEL
CHOLESTEROL: 203 mg/dL — AB (ref 0–200)
HDL: 62.6 mg/dL (ref >39.00)
NonHDL: 140.4
Total CHOL/HDL Ratio: 3
Triglycerides: 321 mg/dL — ABNORMAL HIGH (ref 0.0–149.0)
VLDL: 64.2 mg/dL — AB (ref 0.0–40.0)

## 2014-04-10 LAB — CBC WITH DIFFERENTIAL/PLATELET
BASOS PCT: 1 % (ref 0.0–3.0)
Basophils Absolute: 0.1 10*3/uL (ref 0.0–0.1)
EOS ABS: 0.4 10*3/uL (ref 0.0–0.7)
EOS PCT: 5 % (ref 0.0–5.0)
HCT: 45.1 % (ref 39.0–52.0)
HEMOGLOBIN: 15.3 g/dL (ref 13.0–17.0)
LYMPHS PCT: 32.4 % (ref 12.0–46.0)
Lymphs Abs: 2.9 10*3/uL (ref 0.7–4.0)
MCHC: 33.8 g/dL (ref 30.0–36.0)
MCV: 102 fl — ABNORMAL HIGH (ref 78.0–100.0)
Monocytes Absolute: 1.1 10*3/uL — ABNORMAL HIGH (ref 0.1–1.0)
Monocytes Relative: 12.6 % — ABNORMAL HIGH (ref 3.0–12.0)
Neutro Abs: 4.4 10*3/uL (ref 1.4–7.7)
Neutrophils Relative %: 49 % (ref 43.0–77.0)
Platelets: 283 10*3/uL (ref 150.0–400.0)
RBC: 4.42 Mil/uL (ref 4.22–5.81)
RDW: 13.4 % (ref 11.5–15.5)
WBC: 9 10*3/uL (ref 4.0–10.5)

## 2014-04-10 LAB — URINALYSIS, ROUTINE W REFLEX MICROSCOPIC
Bilirubin Urine: NEGATIVE
Hgb urine dipstick: NEGATIVE
Ketones, ur: NEGATIVE
Leukocytes, UA: NEGATIVE
Nitrite: NEGATIVE
PH: 5.5 (ref 5.0–8.0)
RBC / HPF: NONE SEEN (ref 0–?)
Specific Gravity, Urine: 1.02 (ref 1.000–1.030)
TOTAL PROTEIN, URINE-UPE24: NEGATIVE
Urine Glucose: NEGATIVE
Urobilinogen, UA: 0.2 (ref 0.0–1.0)

## 2014-04-10 LAB — LDL CHOLESTEROL, DIRECT: LDL DIRECT: 105 mg/dL

## 2014-04-10 LAB — HEPATIC FUNCTION PANEL
ALBUMIN: 4.1 g/dL (ref 3.5–5.2)
ALK PHOS: 62 U/L (ref 39–117)
ALT: 52 U/L (ref 0–53)
AST: 36 U/L (ref 0–37)
Bilirubin, Direct: 0.2 mg/dL (ref 0.0–0.3)
TOTAL PROTEIN: 6.9 g/dL (ref 6.0–8.3)
Total Bilirubin: 1 mg/dL (ref 0.2–1.2)

## 2014-04-10 LAB — BASIC METABOLIC PANEL
BUN: 15 mg/dL (ref 6–23)
CHLORIDE: 102 meq/L (ref 96–112)
CO2: 30 meq/L (ref 19–32)
Calcium: 9.7 mg/dL (ref 8.4–10.5)
Creatinine, Ser: 1 mg/dL (ref 0.4–1.5)
GFR: 84.13 mL/min (ref >60.00)
Glucose, Bld: 107 mg/dL — ABNORMAL HIGH (ref 70–99)
POTASSIUM: 4.2 meq/L (ref 3.5–5.1)
SODIUM: 137 meq/L (ref 135–145)

## 2014-04-10 LAB — TSH: TSH: 3.31 u[IU]/mL (ref 0.35–4.50)

## 2014-04-10 LAB — PSA: PSA: 1.27 ng/mL (ref 0.10–4.00)

## 2014-04-10 NOTE — Patient Instructions (Addendum)
You had the new Prevnar pneumonia shot today  You can make a Nurse Visit at any time after 8 weeks to have the second pneumonia shot done (the Pneumovax)  Please continue all other medications as before, and refills have been done if requested.  Please have the pharmacy call with any other refills you may need.  Please continue your efforts at being more active, low cholesterol diet, and weight control.  You are otherwise up to date with prevention measures today.  Please keep your appointments with your specialists as you may have planned  Please go to the LAB in the Basement (turn left off the elevator) for the tests to be done today  You will be contacted by phone if any changes need to be made immediately.  Otherwise, you will receive a letter about your results with an explanation, but please check with MyChart first.  Please remember to sign up for MyChart if you have not done so, as this will be important to you in the future with finding out test results, communicating by private email, and scheduling acute appointments online when needed.  Please stop smoking, and drink Alcohol in moderation  Please return in 1 year for your yearly visit, or sooner if needed

## 2014-04-10 NOTE — Progress Notes (Signed)
Subjective:    Patient ID: Manuel Cruz, male    DOB: 1946/02/06, 68 y.o.   MRN: [id number]  HPI  Here for yearly f/u;  Overall doing ok;  Pt denies CP, worsening SOB, DOE, wheezing, orthopnea, PND, worsening LE edema, palpitations, dizziness or syncope.  Pt denies neurological change such as new headache, facial or extremity weakness.  Pt denies polydipsia, polyuria, or low sugar symptoms. Pt states overall good compliance with treatment and medications, good tolerability, and has been trying to follow lower cholesterol diet.  Pt denies worsening depressive symptoms, suicidal ideation or panic. No fever, night sweats, wt loss, loss of appetite, or other constitutional symptoms.  Pt states good ability with ADL's, has low fall risk, home safety reviewed and adequate, no other significant changes in hearing or vision, and only occasionally active with exercise.  States less alcohol in past few yrs.  Still smoking a few cigs with ETOH only, trying to cut down on both. Completely retired - ins business.   Past Medical History  Diagnosis Date  . Hyperlipidemia   . Allergic rhinitis   . Sciatica of right side     recurrent  . Warts, genital   . Squamous cell skin cancer, earlobe    Past Surgical History  Procedure Laterality Date  . Lumbar disc surgery  2004    reports that he has been smoking.  He does not have any smokeless tobacco history on file. He reports that he drinks alcohol. His drug history is not on file. family history includes Cancer in his mother; Hyperlipidemia in his brother and father. No Known Allergies Current Outpatient Prescriptions on File Prior to Visit  Medication Sig Dispense Refill  . aspirin 81 MG EC tablet Take 81 mg by mouth daily.        Marland Kitchen atorvastatin (LIPITOR) 40 MG tablet Take 1 tablet (40 mg total) by mouth daily.  90 tablet  3  . gabapentin (NEURONTIN) 300 MG capsule TAKE 1 CAPSULE 3 TIMES A DAY  270 capsule  1  . omeprazole (PRILOSEC) 20 MG capsule Take 1  capsule (20 mg total) by mouth daily.  90 capsule  3   No current facility-administered medications on file prior to visit.   Review of Systems Constitutional: Negative for increased diaphoresis, other activity, appetite or other siginficant weight change  HENT: Negative for worsening hearing loss, ear pain, facial swelling, mouth sores and neck stiffness.   Eyes: Negative for other worsening pain, redness or visual disturbance.  Respiratory: Negative for shortness of breath and wheezing.   Cardiovascular: Negative for chest pain and palpitations.  Gastrointestinal: Negative for diarrhea, blood in stool, abdominal distention or other pain Genitourinary: Negative for hematuria, flank pain or change in urine volume.  Musculoskeletal: Negative for myalgias or other joint complaints.  Skin: Negative for color change and wound.  Neurological: Negative for syncope and numbness. other than noted Hematological: Negative for adenopathy. or other swelling Psychiatric/Behavioral: Negative for hallucinations, self-injury, decreased concentration or other worsening agitation.      Objective:   Physical Exam BP 110/72  Pulse 73  Temp(Src) 97.7 F (36.5 C) (Oral)  Ht 5\' 8"  (1.727 m)  Wt 156 lb 4 oz (70.875 kg)  BMI 23.76 kg/m2  SpO2 95% VS noted,  Constitutional: Pt is oriented to person, place, and time. Appears well-developed and well-nourished.  Head: Normocephalic and atraumatic.  Right Ear: External ear normal.  Left Ear: External ear normal.  Nose: Nose normal.  Mouth/Throat: Oropharynx  is clear and moist.  Eyes: Conjunctivae and EOM are normal. Pupils are equal, round, and reactive to light.  Neck: Normal range of motion. Neck supple. No JVD present. No tracheal deviation present.  Cardiovascular: Normal rate, regular rhythm, normal heart sounds and intact distal pulses.   Pulmonary/Chest: Effort normal and breath sounds without rales or wheezing  Abdominal: Soft. Bowel sounds are  normal. NT. No HSM  Musculoskeletal: Normal range of motion. Exhibits no edema.  Lymphadenopathy:  Has no cervical adenopathy.  Neurological: Pt is alert and oriented to person, place, and time. Pt has normal reflexes. No cranial nerve deficit. Motor grossly intact Skin: Skin is warm and dry. No rash noted.  Psychiatric:  Has normal mood and affect. Behavior is normal. mild dysphoric    Assessment & Plan:

## 2014-04-10 NOTE — Progress Notes (Signed)
Pre visit review using our clinic review tool, if applicable. No additional management support is needed unless otherwise documented below in the visit note. 

## 2014-04-12 DIAGNOSIS — F32A Depression, unspecified: Secondary | ICD-10-CM | POA: Insufficient documentation

## 2014-04-12 DIAGNOSIS — F329 Major depressive disorder, single episode, unspecified: Secondary | ICD-10-CM | POA: Insufficient documentation

## 2014-04-12 DIAGNOSIS — F17200 Nicotine dependence, unspecified, uncomplicated: Secondary | ICD-10-CM | POA: Insufficient documentation

## 2014-04-12 NOTE — Assessment & Plan Note (Signed)
Mild to mod persitent, denies SI, declines counseling or med tx at this time,  to f/u any worsening symptoms or concerns

## 2014-04-12 NOTE — Assessment & Plan Note (Addendum)
Urged to quit, not ready, declines chantix,  to f/u any worsening symptoms or concerns

## 2014-04-12 NOTE — Assessment & Plan Note (Signed)
Urged to quit, not ready it seems, now at 4 beers per day per pt

## 2014-04-12 NOTE — Assessment & Plan Note (Signed)
stable overall by history and exam, recent data reviewed with pt, and pt to continue medical treatment as before,  to f/u any worsening symptoms or concerns Lab Results  Component Value Date   [place] 82 02/29/2012   For f/u lab

## 2014-05-13 ENCOUNTER — Telehealth: Payer: Self-pay | Admitting: Internal Medicine

## 2014-05-13 NOTE — Telephone Encounter (Signed)
Pt called in stated that he has run out of Omeprezole 20Mg  and he wants to see if he can get it called into [facility] number is [phone].

## 2014-05-13 NOTE — Telephone Encounter (Signed)
Called pt no answer LMOM RTC... Med is not on med list.../lmb

## 2014-05-14 MED ORDER — OMEPRAZOLE 20 MG PO CPDR: 20.0000 mg | DELAYED_RELEASE_CAPSULE | Freq: Every day | ORAL | Status: DC

## 2014-05-14 NOTE — Telephone Encounter (Signed)
Notified pt med has been sent to [facility]...Manuel Cruz

## 2014-05-14 NOTE — Telephone Encounter (Signed)
Pt has been taking Omeprazole 20 mg, he states he hasn't needed a re-fill until now.  It's on his past med list.    [address] [place], [place] [phone] (Phone) [phone] (Fax)

## 2014-06-07 DIAGNOSIS — Z23 Encounter for immunization: Secondary | ICD-10-CM | POA: Diagnosis not present

## 2014-09-25 ENCOUNTER — Encounter: Payer: Self-pay | Admitting: Internal Medicine

## 2014-09-25 ENCOUNTER — Ambulatory Visit (INDEPENDENT_AMBULATORY_CARE_PROVIDER_SITE_OTHER): Payer: Medicare Other | Admitting: Internal Medicine

## 2014-09-25 VITALS — BP 142/84 | HR 77 | Temp 97.7°F | Ht 68.0 in | Wt 159.0 lb

## 2014-09-25 DIAGNOSIS — R05 Cough: Secondary | ICD-10-CM | POA: Diagnosis not present

## 2014-09-25 DIAGNOSIS — J309 Allergic rhinitis, unspecified: Secondary | ICD-10-CM | POA: Diagnosis not present

## 2014-09-25 DIAGNOSIS — R059 Cough, unspecified: Secondary | ICD-10-CM

## 2014-09-25 DIAGNOSIS — R062 Wheezing: Secondary | ICD-10-CM | POA: Diagnosis not present

## 2014-09-25 DIAGNOSIS — J069 Acute upper respiratory infection, unspecified: Secondary | ICD-10-CM | POA: Diagnosis not present

## 2014-09-25 MED ORDER — PREDNISONE 10 MG PO TABS: ORAL_TABLET | ORAL | Status: DC

## 2014-09-25 MED ORDER — AZITHROMYCIN 250 MG PO TABS: ORAL_TABLET | ORAL | Status: DC

## 2014-09-25 MED ORDER — HYDROCODONE-HOMATROPINE 5-1.5 MG/5ML PO SYRP: 5.0000 mL | ORAL_SOLUTION | Freq: Four times a day (QID) | ORAL | Status: DC | PRN

## 2014-09-25 MED ORDER — METHYLPREDNISOLONE ACETATE 80 MG/ML IJ SUSP
80.0000 mg | Freq: Once | INTRAMUSCULAR | Status: AC
  Administered 2014-09-25: 80 mg via INTRAMUSCULAR

## 2014-09-25 NOTE — Patient Instructions (Signed)
Please take all new medication as prescribed  Please continue all other medications as before, and refills have been done if requested.  Please have the pharmacy call with any other refills you may need.  Please continue your efforts at being more active, low cholesterol diet, and weight control.  Please keep your appointments with your specialists as you may have planned    

## 2014-09-25 NOTE — Progress Notes (Signed)
Pre visit review using our clinic review tool, if applicable. No additional management support is needed unless otherwise documented below in the visit note. 

## 2014-09-26 ENCOUNTER — Telehealth: Payer: Self-pay | Admitting: Internal Medicine

## 2014-09-26 NOTE — Telephone Encounter (Signed)
emmi emailed °

## 2014-10-02 DIAGNOSIS — J069 Acute upper respiratory infection, unspecified: Secondary | ICD-10-CM | POA: Insufficient documentation

## 2014-10-02 DIAGNOSIS — R062 Wheezing: Secondary | ICD-10-CM | POA: Insufficient documentation

## 2014-10-02 NOTE — Assessment & Plan Note (Signed)
Mild to mod, for antibx course,  to f/u any worsening symptoms or concerns 

## 2014-10-02 NOTE — Progress Notes (Signed)
   Subjective:    Patient ID: Manuel Cruz, male    DOB: 04-29-1946, 69 y.o.   MRN: [id number]  HPI   Here with 2-3 days acute onset fever, facial pain, pressure, headache, general weakness and malaise, and greenish d/c, with mild ST and cough, but pt denies chest pain, wheezing, increased sob or doe, orthopnea, PND, increased LE swelling, palpitations, dizziness or syncope, except for onset mild wheezing/sob since 1-2 days. Does have several wks ongoing nasal allergy symptoms with clearish congestion, itch and sneezing, without fever, pain, ST, cough, swelling or wheezing. Past Medical History  Diagnosis Date  . Hyperlipidemia   . Allergic rhinitis   . Sciatica of right side     recurrent  . Warts, genital   . Squamous cell skin cancer, earlobe    Past Surgical History  Procedure Laterality Date  . Lumbar disc surgery  2004    reports that he has been smoking.  He does not have any smokeless tobacco history on file. He reports that he drinks alcohol. His drug history is not on file. family history includes Cancer in his mother; Hyperlipidemia in his brother and father. No Known Allergies Current Outpatient Prescriptions on File Prior to Visit  Medication Sig Dispense Refill  . aspirin 81 MG EC tablet Take 81 mg by mouth daily.      Marland Kitchen atorvastatin (LIPITOR) 40 MG tablet Take 1 tablet (40 mg total) by mouth daily. 90 tablet 3  . gabapentin (NEURONTIN) 300 MG capsule TAKE 1 CAPSULE 3 TIMES A DAY 270 capsule 1  . omeprazole (PRILOSEC) 20 MG capsule Take 1 capsule (20 mg total) by mouth daily. 90 capsule 3   No current facility-administered medications on file prior to visit.   Review of Systems  Constitutional: Negative for unusual diaphoresis or other sweats  HENT: Negative for ringing in ear Eyes: Negative for double vision or worsening visual disturbance.  Respiratory: Negative for choking and stridor.   Gastrointestinal: Negative for vomiting or other signifcant bowel  change Genitourinary: Negative for hematuria or decreased urine volume.  Musculoskeletal: Negative for other MSK pain or swelling Skin: Negative for color change and worsening wound.  Neurological: Negative for tremors and numbness other than noted  Psychiatric/Behavioral: Negative for decreased concentration or agitation other than above       Objective:   Physical Exam BP 142/84 mmHg  Pulse 77  Temp(Src) 97.7 F (36.5 C) (Oral)  Ht 5\' 8"  (1.727 m)  Wt 159 lb (72.122 kg)  BMI 24.18 kg/m2 VS noted, mild ill Constitutional: Pt appears well-developed, well-nourished.  HENT: Head: NCAT.  Right Ear: External ear normal.  Left Ear: External ear normal.  Eyes: . Pupils are equal, round, and reactive to light. Conjunctivae and EOM are normal Bilat tm's with mild erythema.  Max sinus areas mild tender.  Pharynx with mild erythema, no exudate Neck: Normal range of motion. Neck supple.  Cardiovascular: Normal rate and regular rhythm.   Pulmonary/Chest: Effort normal and breath sounds without rales but + mild wheezing. bilat Neurological: Pt is alert. Not confused , motor grossly intact Skin: Skin is warm. No rash Psychiatric: Pt behavior is normal. No agitation.     Assessment & Plan:

## 2014-10-02 NOTE — Assessment & Plan Note (Signed)
Likely to improve with steroid tx as well,  to f/u any worsening symptoms or concerns  

## 2014-10-02 NOTE — Assessment & Plan Note (Signed)
Mild to mod, for depomedrol IM, prednisone asd,  to f/u any worsening symptoms or concerns

## 2015-01-17 DIAGNOSIS — H2513 Age-related nuclear cataract, bilateral: Secondary | ICD-10-CM | POA: Diagnosis not present

## 2015-01-17 DIAGNOSIS — H524 Presbyopia: Secondary | ICD-10-CM | POA: Diagnosis not present

## 2015-01-17 DIAGNOSIS — H04123 Dry eye syndrome of bilateral lacrimal glands: Secondary | ICD-10-CM | POA: Diagnosis not present

## 2015-01-17 DIAGNOSIS — H10413 Chronic giant papillary conjunctivitis, bilateral: Secondary | ICD-10-CM | POA: Diagnosis not present

## 2015-01-17 DIAGNOSIS — H3531 Nonexudative age-related macular degeneration: Secondary | ICD-10-CM | POA: Diagnosis not present

## 2015-03-31 DIAGNOSIS — D239 Other benign neoplasm of skin, unspecified: Secondary | ICD-10-CM | POA: Diagnosis not present

## 2015-03-31 DIAGNOSIS — L570 Actinic keratosis: Secondary | ICD-10-CM | POA: Diagnosis not present

## 2015-03-31 DIAGNOSIS — L821 Other seborrheic keratosis: Secondary | ICD-10-CM | POA: Diagnosis not present

## 2015-04-17 ENCOUNTER — Other Ambulatory Visit (INDEPENDENT_AMBULATORY_CARE_PROVIDER_SITE_OTHER): Payer: Medicare Other

## 2015-04-17 ENCOUNTER — Encounter: Payer: Self-pay | Admitting: Internal Medicine

## 2015-04-17 ENCOUNTER — Ambulatory Visit (INDEPENDENT_AMBULATORY_CARE_PROVIDER_SITE_OTHER): Payer: Medicare Other | Admitting: Internal Medicine

## 2015-04-17 VITALS — BP 120/78 | HR 67 | Temp 98.0°F | Ht 68.0 in | Wt 159.0 lb

## 2015-04-17 DIAGNOSIS — M5431 Sciatica, right side: Secondary | ICD-10-CM | POA: Diagnosis not present

## 2015-04-17 DIAGNOSIS — Z0000 Encounter for general adult medical examination without abnormal findings: Secondary | ICD-10-CM

## 2015-04-17 DIAGNOSIS — E785 Hyperlipidemia, unspecified: Secondary | ICD-10-CM | POA: Diagnosis not present

## 2015-04-17 DIAGNOSIS — Z23 Encounter for immunization: Secondary | ICD-10-CM | POA: Diagnosis not present

## 2015-04-17 DIAGNOSIS — N320 Bladder-neck obstruction: Secondary | ICD-10-CM

## 2015-04-17 DIAGNOSIS — R739 Hyperglycemia, unspecified: Secondary | ICD-10-CM | POA: Diagnosis not present

## 2015-04-17 DIAGNOSIS — F329 Major depressive disorder, single episode, unspecified: Secondary | ICD-10-CM | POA: Diagnosis not present

## 2015-04-17 DIAGNOSIS — F32A Depression, unspecified: Secondary | ICD-10-CM

## 2015-04-17 LAB — BASIC METABOLIC PANEL
BUN: 9 mg/dL (ref 6–23)
CHLORIDE: 102 meq/L (ref 96–112)
CO2: 28 meq/L (ref 19–32)
CREATININE: 0.85 mg/dL (ref 0.40–1.50)
Calcium: 9.3 mg/dL (ref 8.4–10.5)
GFR: 95.35 mL/min (ref >60.00)
Glucose, Bld: 101 mg/dL — ABNORMAL HIGH (ref 70–99)
Potassium: 3.7 mEq/L (ref 3.5–5.1)
Sodium: 138 mEq/L (ref 135–145)

## 2015-04-17 LAB — URINALYSIS, ROUTINE W REFLEX MICROSCOPIC
BILIRUBIN URINE: NEGATIVE
Hgb urine dipstick: NEGATIVE
Ketones, ur: NEGATIVE
Leukocytes, UA: NEGATIVE
Nitrite: NEGATIVE
RBC / HPF: NONE SEEN (ref 0–?)
SPECIFIC GRAVITY, URINE: 1.025 (ref 1.000–1.030)
UROBILINOGEN UA: 0.2 (ref 0.0–1.0)
Urine Glucose: NEGATIVE
pH: 6 (ref 5.0–8.0)

## 2015-04-17 LAB — HEPATIC FUNCTION PANEL
ALBUMIN: 4.3 g/dL (ref 3.5–5.2)
ALT: 54 U/L — ABNORMAL HIGH (ref 0–53)
AST: 45 U/L — AB (ref 0–37)
Alkaline Phosphatase: 70 U/L (ref 39–117)
BILIRUBIN TOTAL: 0.9 mg/dL (ref 0.2–1.2)
Bilirubin, Direct: 0.2 mg/dL (ref 0.0–0.3)
Total Protein: 6.9 g/dL (ref 6.0–8.3)

## 2015-04-17 LAB — LIPID PANEL
Cholesterol: 208 mg/dL — ABNORMAL HIGH (ref 0–200)
HDL: 71.6 mg/dL (ref >39.00)
NONHDL: 136.54
Total CHOL/HDL Ratio: 3
Triglycerides: 284 mg/dL — ABNORMAL HIGH (ref 0.0–149.0)
VLDL: 56.8 mg/dL — ABNORMAL HIGH (ref 0.0–40.0)

## 2015-04-17 LAB — CBC WITH DIFFERENTIAL/PLATELET
BASOS PCT: 0.6 % (ref 0.0–3.0)
Basophils Absolute: 0.1 10*3/uL (ref 0.0–0.1)
EOS PCT: 4.2 % (ref 0.0–5.0)
Eosinophils Absolute: 0.3 10*3/uL (ref 0.0–0.7)
HEMATOCRIT: 45 % (ref 39.0–52.0)
HEMOGLOBIN: 15.2 g/dL (ref 13.0–17.0)
LYMPHS ABS: 2.3 10*3/uL (ref 0.7–4.0)
Lymphocytes Relative: 28.4 % (ref 12.0–46.0)
MCHC: 33.8 g/dL (ref 30.0–36.0)
MCV: 101.7 fl — AB (ref 78.0–100.0)
Monocytes Absolute: 1.1 10*3/uL — ABNORMAL HIGH (ref 0.1–1.0)
Monocytes Relative: 13.3 % — ABNORMAL HIGH (ref 3.0–12.0)
Neutro Abs: 4.4 10*3/uL (ref 1.4–7.7)
Neutrophils Relative %: 53.5 % (ref 43.0–77.0)
PLATELETS: 252 10*3/uL (ref 150.0–400.0)
RBC: 4.43 Mil/uL (ref 4.22–5.81)
RDW: 13.4 % (ref 11.5–15.5)
WBC: 8.2 10*3/uL (ref 4.0–10.5)

## 2015-04-17 LAB — HEMOGLOBIN A1C: HEMOGLOBIN A1C: 5.5 % (ref 4.6–6.5)

## 2015-04-17 LAB — LDL CHOLESTEROL, DIRECT: LDL DIRECT: 108 mg/dL

## 2015-04-17 LAB — PSA: PSA: 1.4 ng/mL (ref 0.10–4.00)

## 2015-04-17 LAB — TSH: TSH: 2.66 u[IU]/mL (ref 0.35–4.50)

## 2015-04-17 MED ORDER — GABAPENTIN 300 MG PO CAPS: ORAL_CAPSULE | ORAL | Status: DC

## 2015-04-17 MED ORDER — OMEPRAZOLE 20 MG PO CPDR: 20.0000 mg | DELAYED_RELEASE_CAPSULE | Freq: Every day | ORAL | Status: DC

## 2015-04-17 NOTE — Patient Instructions (Addendum)
You had the Pneumovax shot today  Please continue all other medications as before, and refills have been done if requested.  Please have the pharmacy call with any other refills you may need.  Please continue your efforts at being more active, low cholesterol diet, and weight control.  You are otherwise up to date with prevention measures today.  Please keep your appointments with your specialists as you may have planned  Please go to the LAB in the Basement (turn left off the elevator) for the tests to be done today  You will be contacted by phone if any changes need to be made immediately.  Otherwise, you will receive a letter about your results with an explanation, but please check with MyChart first.  Please remember to sign up for MyChart if you have not done so, as this will be important to you in the future with finding out test results, communicating by private email, and scheduling acute appointments online when needed.  Please return in 1 year for your yearly visit, or sooner if needed 

## 2015-04-17 NOTE — Addendum Note (Signed)
Addended by: Lyman Bishop on: 04/17/2015 02:06 PM   Modules accepted: Orders

## 2015-04-17 NOTE — Progress Notes (Signed)
Subjective:    Patient ID: Manuel Cruz, male    DOB: 1945-12-07, 69 y.o.   MRN: [id number]  HPI  Here for yearly f/u;  Overall doing ok;  Pt denies Chest pain, worsening SOB, DOE, wheezing, orthopnea, PND, worsening LE edema, palpitations, dizziness or syncope.  Pt denies neurological change such as new headache, facial or extremity weakness.  Pt denies polydipsia, polyuria, or low sugar symptoms. Pt states overall good compliance with treatment and medications, good tolerability, and has been trying to follow appropriate diet.  Pt denies worsening depressive symptoms, suicidal ideation or panic. No fever, night sweats, wt loss, loss of appetite, or other constitutional symptoms.  Pt states good ability with ADL's, has low fall risk, home safety reviewed and adequate, no other significant changes in hearing or vision, and only occasionally active with exercise.  Pt continues to have recurring right LBP with sciatica ok with neurontin, no bowel or bladder change, fever, wt loss,  worsening LE pain/numbness/weakness, gait change or falls.  Denies worsening depressive symptoms, suicidal ideation, or panic  Past Medical History  Diagnosis Date  . Hyperlipidemia   . Allergic rhinitis   . Sciatica of right side     recurrent  . Warts, genital   . Squamous cell skin cancer, earlobe    Past Surgical History  Procedure Laterality Date  . Lumbar disc surgery  2004    reports that he has been smoking.  He does not have any smokeless tobacco history on file. He reports that he drinks alcohol. His drug history is not on file. family history includes Cancer in his mother; Hyperlipidemia in his brother and father. No Known Allergies Current Outpatient Prescriptions on File Prior to Visit  Medication Sig Dispense Refill  . aspirin 81 MG EC tablet Take 81 mg by mouth daily.      Marland Kitchen atorvastatin (LIPITOR) 40 MG tablet Take 1 tablet (40 mg total) by mouth daily. 90 tablet 3  . azithromycin (ZITHROMAX  Z-PAK) 250 MG tablet Use as directed (Patient not taking: Reported on 04/17/2015) 6 tablet 1  . HYDROcodone-homatropine (HYCODAN) 5-1.5 MG/5ML syrup Take 5 mLs by mouth every 6 (six) hours as needed for cough. (Patient not taking: Reported on 04/17/2015) 180 mL 0  . predniSONE (DELTASONE) 10 MG tablet 3 tabs by mouth per day for 3 days,2tabs per day for 3 days,1tab per day for 3 days (Patient not taking: Reported on 04/17/2015) 18 tablet 0   No current facility-administered medications on file prior to visit.    Review of Systems  Constitutional: Negative for unusual diaphoresis or night sweats HENT: Negative for ringing in ear or discharge Eyes: Negative for double vision or worsening visual disturbance.  Respiratory: Negative for choking and stridor.   Gastrointestinal: Negative for vomiting or other signifcant bowel change Genitourinary: Negative for hematuria or change in urine volume.  Musculoskeletal: Negative for other MSK pain or swelling Skin: Negative for color change and worsening wound.  Neurological: Negative for tremors and numbness other than noted  Psychiatric/Behavioral: Negative for decreased concentration or agitation other than above       Objective:   Physical Exam BP 120/78 mmHg  Pulse 67  Temp(Src) 98 F (36.7 C) (Oral)  Ht 5\' 8"  (1.727 m)  Wt 159 lb (72.122 kg)  BMI 24.18 kg/m2  SpO2 98% VS noted,  Constitutional: Pt appears in no significant distress HENT: Head: NCAT.  Right Ear: External ear normal.  Left Ear: External ear normal.  Eyes: .  Pupils are equal, round, and reactive to light. Conjunctivae and EOM are normal Neck: Normal range of motion. Neck supple.  Cardiovascular: Normal rate and regular rhythm.   Pulmonary/Chest: Effort normal and breath sounds without rales or wheezing.  Abd:  Soft, NT, ND, + BS Neurological: Pt is alert. Not confused , motor 5/5 intact, sens/dtr symmetric and full Dorsalis pedis 1+ bilat Skin: Skin is warm. No rash, no LE  edema Psychiatric: Pt behavior is normal. No agitation. Not depressed affect     Assessment & Plan:

## 2015-04-17 NOTE — Assessment & Plan Note (Signed)
stable overall by history and exam, and pt to continue medical treatment as before,  to f/u any worsening symptoms or concerns 

## 2015-04-17 NOTE — Assessment & Plan Note (Signed)
stable overall by history and exam, recent data reviewed with pt, and pt to continue medical treatment as before,  to f/u any worsening symptoms or concerns Lab Results  Component Value Date   WBC 9.0 04/10/2014   HGB 15.3 04/10/2014   HCT 45.1 04/10/2014   PLT 283.0 04/10/2014   GLUCOSE 107* 04/10/2014   CHOL 203* 04/10/2014   TRIG 321.0* 04/10/2014   HDL 62.60 04/10/2014   LDLDIRECT 105.0 04/10/2014   LDLCALC 82 02/29/2012   ALT 52 04/10/2014   AST 36 04/10/2014   NA 137 04/10/2014   K 4.2 04/10/2014   CL 102 04/10/2014   CREATININE 1.0 04/10/2014   BUN 15 04/10/2014   CO2 30 04/10/2014   TSH 3.31 04/10/2014   PSA 1.27 04/10/2014

## 2015-04-17 NOTE — Progress Notes (Signed)
Pre visit review using our clinic review tool, if applicable. No additional management support is needed unless otherwise documented below in the visit note. 

## 2015-04-17 NOTE — Assessment & Plan Note (Signed)
stable overall by history and exam, recent data reviewed with pt, and pt to continue medical treatment as before,  to f/u any worsening symptoms or concerns Lab Results  Component Value Date   LDLCALC 82 02/29/2012   For f/u labs, cont lower chol diet

## 2015-05-15 ENCOUNTER — Encounter: Payer: Self-pay | Admitting: Gastroenterology

## 2015-05-23 ENCOUNTER — Encounter: Payer: Self-pay | Admitting: Gastroenterology

## 2015-05-27 DIAGNOSIS — Z23 Encounter for immunization: Secondary | ICD-10-CM | POA: Diagnosis not present

## 2015-05-30 ENCOUNTER — Other Ambulatory Visit: Payer: Self-pay | Admitting: Internal Medicine

## 2015-07-23 ENCOUNTER — Ambulatory Visit (AMBULATORY_SURGERY_CENTER): Payer: Self-pay

## 2015-07-23 VITALS — Ht 68.0 in | Wt 161.0 lb

## 2015-07-23 DIAGNOSIS — Z1211 Encounter for screening for malignant neoplasm of colon: Secondary | ICD-10-CM

## 2015-07-23 MED ORDER — NA SULFATE-K SULFATE-MG SULF 17.5-3.13-1.6 GM/177ML PO SOLN: 1.0000 | Freq: Once | ORAL | Status: DC

## 2015-07-23 NOTE — Progress Notes (Signed)
No egg or soy allergies Not on home 02 No previous anesthesia complications No diet or weight loss meds 

## 2015-08-06 ENCOUNTER — Encounter: Payer: BLUE CROSS/BLUE SHIELD | Admitting: Gastroenterology

## 2015-09-03 ENCOUNTER — Encounter: Payer: Self-pay | Admitting: Gastroenterology

## 2015-09-03 ENCOUNTER — Other Ambulatory Visit: Payer: Self-pay | Admitting: Gastroenterology

## 2015-09-03 ENCOUNTER — Ambulatory Visit (AMBULATORY_SURGERY_CENTER): Payer: Medicare Other | Admitting: Gastroenterology

## 2015-09-03 VITALS — BP 145/83 | HR 66 | Temp 96.6°F | Resp 16 | Ht 68.0 in | Wt 159.0 lb

## 2015-09-03 DIAGNOSIS — Z1211 Encounter for screening for malignant neoplasm of colon: Secondary | ICD-10-CM

## 2015-09-03 DIAGNOSIS — D120 Benign neoplasm of cecum: Secondary | ICD-10-CM

## 2015-09-03 DIAGNOSIS — D122 Benign neoplasm of ascending colon: Secondary | ICD-10-CM

## 2015-09-03 MED ORDER — SODIUM CHLORIDE 0.9 % IV SOLN: 500.0000 mL | INTRAVENOUS | Status: DC

## 2015-09-03 NOTE — Progress Notes (Signed)
Patient awakening,vss,report to rn 

## 2015-09-03 NOTE — Op Note (Signed)
[place]  [address]. [place], [address]   COLONOSCOPY PROCEDURE REPORT  PATIENT: Manuel Cruz, Manuel Cruz  MR#: [id number] BIRTHDATE: [birth year] , 85  yrs. old GENDER: male ENDOSCOPIST: Yetta Flock, MD REFERRED BY: Cathlean Cower MD PROCEDURE DATE:  09/03/2015 PROCEDURE:   Colonoscopy, screening and Colonoscopy with biopsy First Screening Colonoscopy - Avg.  risk and is 50 yrs.  old or older - No.  Prior Negative Screening - Now for repeat screening. 10 or more years since last screening  History of Adenoma - Now for follow-up colonoscopy & has been > or = to 3 yrs.  N/A  Polyps removed today? Yes ASA CLASS:   Class II INDICATIONS:Screening for colonic neoplasia and Colorectal Neoplasm Risk Assessment for this procedure is average risk. MEDICATIONS: Propofol 300 mg IV  DESCRIPTION OF PROCEDURE:   After the risks benefits and alternatives of the procedure were thoroughly explained, informed consent was obtained.  The digital rectal exam revealed no abnormalities of the rectum.   The LB [id number]  endoscope was introduced through the anus and advanced to the cecum, which was identified by both the appendix and ileocecal valve. No adverse events experienced.   The quality of the prep was good.  The instrument was then slowly withdrawn as the colon was fully examined. Estimated blood loss is zero unless otherwise noted in this procedure report.   COLON FINDINGS: A sessile polyp measuring 3 mm in size was found at the cecum.  A polypectomy was performed with cold forceps.  The resection was complete, the polyp tissue was completely retrieved and sent to histology.   A sessile polyp measuring 3 mm in size was found in the ascending colon.  A polypectomy was performed with cold forceps.  The resection was complete, the polyp tissue was completely retrieved and sent to histology.   There was mild diverticulosis noted in the sigmoid colon.   The examination  was otherwise normal.  Retroflexed views revealed internal hemorrhoids. The time to cecum = 2.5 Withdrawal time = 13.0   The scope was withdrawn and the procedure completed. COMPLICATIONS: There were no immediate complications.  ENDOSCOPIC IMPRESSION: 1.   Sessile polyp was found at the cecum; polypectomy was performed with cold forceps 2.   Sessile polyp was found in the ascending colon; polypectomy was performed with cold forceps 3.   Mild diverticulosis was noted in the sigmoid colon 4.   The examination was otherwise normal  RECOMMENDATIONS: 1.  Await pathology results 2.  Resume diet 3.  Resume medications 4.  Follow up in the GI clinic as needed for hemorrhoid treatment   eSigned:  Yetta Flock, MD 09/03/2015 3:26 PM revise cc:  Cathlean Cower MD, the patient   PATIENT NAME:  Manuel Cruz MR#: [id number]

## 2015-09-03 NOTE — Progress Notes (Signed)
Called to room to assist during endoscopic procedure.  Patient ID and intended procedure confirmed with present staff. Received instructions for my participation in the procedure from the performing physician.  

## 2015-09-03 NOTE — Patient Instructions (Signed)
YOU HAD AN ENDOSCOPIC PROCEDURE TODAY AT THE  ENDOSCOPY CENTER:   Refer to the procedure report that was given to you for any specific questions about what was found during the examination.  If the procedure report does not answer your questions, please call your gastroenterologist to clarify.  If you requested that your care partner not be given the details of your procedure findings, then the procedure report has been included in a sealed envelope for you to review at your convenience later.  YOU SHOULD EXPECT: Some feelings of bloating in the abdomen. Passage of more gas than usual.  Walking can help get rid of the air that was put into your GI tract during the procedure and reduce the bloating. If you had a lower endoscopy (such as a colonoscopy or flexible sigmoidoscopy) you may notice spotting of blood in your stool or on the toilet paper. If you underwent a bowel prep for your procedure, you may not have a normal bowel movement for a few days.  Please Note:  You might notice some irritation and congestion in your nose or some drainage.  This is from the oxygen used during your procedure.  There is no need for concern and it should clear up in a day or so.  SYMPTOMS TO REPORT IMMEDIATELY:   Following lower endoscopy (colonoscopy or flexible sigmoidoscopy):  Excessive amounts of blood in the stool  Significant tenderness or worsening of abdominal pains  Swelling of the abdomen that is new, acute  Fever of 100F or higher   For urgent or emergent issues, a gastroenterologist can be reached at any hour by calling [phone].   DIET: Your first meal following the procedure should be a small meal and then it is ok to progress to your normal diet. Heavy or fried foods are harder to digest and may make you feel nauseous or bloated.  Likewise, meals heavy in dairy and vegetables can increase bloating.  Drink plenty of fluids but you should avoid alcoholic beverages for 24  hours.  ACTIVITY:  You should plan to take it easy for the rest of today and you should NOT DRIVE or use heavy machinery until tomorrow (because of the sedation medicines used during the test).    FOLLOW UP: Our staff will call the number listed on your records the next business day following your procedure to check on you and address any questions or concerns that you may have regarding the information given to you following your procedure. If we do not reach you, we will leave a message.  However, if you are feeling well and you are not experiencing any problems, there is no need to return our call.  We will assume that you have returned to your regular daily activities without incident.  If any biopsies were taken you will be contacted by phone or by letter within the next 1-3 weeks.  Please call us at [phone] if you have not heard about the biopsies in 3 weeks.    SIGNATURES/CONFIDENTIALITY: You and/or your care partner have signed paperwork which will be entered into your electronic medical record.  These signatures attest to the fact that that the information above on your After Visit Summary has been reviewed and is understood.  Full responsibility of the confidentiality of this discharge information lies with you and/or your care-partner. 

## 2015-09-04 ENCOUNTER — Telehealth: Payer: Self-pay | Admitting: *Deleted

## 2015-09-04 NOTE — Telephone Encounter (Signed)
  Follow up Call-  Call back number 09/03/2015  Post procedure Call Back phone  # [phone]  Permission to leave phone message Yes     Patient questions:  Do you have a fever, pain , or abdominal swelling? No. Pain Score  0 *  Have you tolerated food without any problems? Yes.    Have you been able to return to your normal activities? Yes.    Do you have any questions about your discharge instructions: Diet   No. Medications  No. Follow up visit  No.  Do you have questions or concerns about your Care? No.  Actions: * If pain score is 4 or above: No action needed, pain <4.

## 2015-09-11 ENCOUNTER — Encounter: Payer: Self-pay | Admitting: Gastroenterology

## 2015-12-10 ENCOUNTER — Encounter (INDEPENDENT_AMBULATORY_CARE_PROVIDER_SITE_OTHER): Payer: Self-pay

## 2016-02-02 DIAGNOSIS — H04123 Dry eye syndrome of bilateral lacrimal glands: Secondary | ICD-10-CM | POA: Diagnosis not present

## 2016-02-02 DIAGNOSIS — H524 Presbyopia: Secondary | ICD-10-CM | POA: Diagnosis not present

## 2016-02-02 DIAGNOSIS — H35363 Drusen (degenerative) of macula, bilateral: Secondary | ICD-10-CM | POA: Diagnosis not present

## 2016-02-02 DIAGNOSIS — H5213 Myopia, bilateral: Secondary | ICD-10-CM | POA: Diagnosis not present

## 2016-02-02 DIAGNOSIS — H10413 Chronic giant papillary conjunctivitis, bilateral: Secondary | ICD-10-CM | POA: Diagnosis not present

## 2016-02-02 DIAGNOSIS — H2513 Age-related nuclear cataract, bilateral: Secondary | ICD-10-CM | POA: Diagnosis not present

## 2016-02-02 DIAGNOSIS — H52223 Regular astigmatism, bilateral: Secondary | ICD-10-CM | POA: Diagnosis not present

## 2016-03-17 ENCOUNTER — Other Ambulatory Visit: Payer: Self-pay | Admitting: Internal Medicine

## 2016-03-17 DIAGNOSIS — E785 Hyperlipidemia, unspecified: Secondary | ICD-10-CM

## 2016-03-17 MED ORDER — ATORVASTATIN CALCIUM 40 MG PO TABS: 40.0000 mg | ORAL_TABLET | Freq: Every day | ORAL | Status: DC

## 2016-03-17 MED ORDER — GABAPENTIN 300 MG PO CAPS: ORAL_CAPSULE | ORAL | Status: DC

## 2016-03-17 NOTE — Addendum Note (Signed)
Addended by: Earnstine Regal on: 03/17/2016 03:25 PM   Modules accepted: Orders

## 2016-04-05 ENCOUNTER — Other Ambulatory Visit: Payer: Self-pay | Admitting: Dermatology

## 2016-04-05 DIAGNOSIS — L821 Other seborrheic keratosis: Secondary | ICD-10-CM | POA: Diagnosis not present

## 2016-04-05 DIAGNOSIS — D2239 Melanocytic nevi of other parts of face: Secondary | ICD-10-CM | POA: Diagnosis not present

## 2016-04-05 DIAGNOSIS — D239 Other benign neoplasm of skin, unspecified: Secondary | ICD-10-CM | POA: Diagnosis not present

## 2016-04-05 DIAGNOSIS — D0439 Carcinoma in situ of skin of other parts of face: Secondary | ICD-10-CM | POA: Diagnosis not present

## 2016-04-05 DIAGNOSIS — D0462 Carcinoma in situ of skin of left upper limb, including shoulder: Secondary | ICD-10-CM | POA: Diagnosis not present

## 2016-04-23 ENCOUNTER — Encounter: Payer: Self-pay | Admitting: Internal Medicine

## 2016-04-23 ENCOUNTER — Ambulatory Visit (INDEPENDENT_AMBULATORY_CARE_PROVIDER_SITE_OTHER): Payer: Medicare Other | Admitting: Internal Medicine

## 2016-04-23 ENCOUNTER — Other Ambulatory Visit (INDEPENDENT_AMBULATORY_CARE_PROVIDER_SITE_OTHER): Payer: Medicare Other

## 2016-04-23 VITALS — BP 126/78 | HR 78 | Temp 98.2°F | Resp 20 | Wt 156.0 lb

## 2016-04-23 DIAGNOSIS — F1010 Alcohol abuse, uncomplicated: Secondary | ICD-10-CM | POA: Diagnosis not present

## 2016-04-23 DIAGNOSIS — E785 Hyperlipidemia, unspecified: Secondary | ICD-10-CM | POA: Diagnosis not present

## 2016-04-23 DIAGNOSIS — Z7289 Other problems related to lifestyle: Secondary | ICD-10-CM

## 2016-04-23 DIAGNOSIS — J309 Allergic rhinitis, unspecified: Secondary | ICD-10-CM

## 2016-04-23 DIAGNOSIS — F32A Depression, unspecified: Secondary | ICD-10-CM

## 2016-04-23 DIAGNOSIS — N320 Bladder-neck obstruction: Secondary | ICD-10-CM | POA: Diagnosis not present

## 2016-04-23 DIAGNOSIS — F329 Major depressive disorder, single episode, unspecified: Secondary | ICD-10-CM

## 2016-04-23 DIAGNOSIS — Z1159 Encounter for screening for other viral diseases: Secondary | ICD-10-CM

## 2016-04-23 DIAGNOSIS — F1090 Alcohol use, unspecified, uncomplicated: Secondary | ICD-10-CM

## 2016-04-23 LAB — URINALYSIS, ROUTINE W REFLEX MICROSCOPIC
Bilirubin Urine: NEGATIVE
Hgb urine dipstick: NEGATIVE
Ketones, ur: NEGATIVE
Leukocytes, UA: NEGATIVE
Nitrite: NEGATIVE
SPECIFIC GRAVITY, URINE: 1.025 (ref 1.000–1.030)
Total Protein, Urine: 30 — AB
URINE GLUCOSE: NEGATIVE
UROBILINOGEN UA: 1 (ref 0.0–1.0)
pH: 6 (ref 5.0–8.0)

## 2016-04-23 LAB — HEPATIC FUNCTION PANEL
ALK PHOS: 61 U/L (ref 39–117)
ALT: 49 U/L (ref 0–53)
AST: 41 U/L — ABNORMAL HIGH (ref 0–37)
Albumin: 4.5 g/dL (ref 3.5–5.2)
BILIRUBIN DIRECT: 0.1 mg/dL (ref 0.0–0.3)
BILIRUBIN TOTAL: 0.6 mg/dL (ref 0.2–1.2)
Total Protein: 7.2 g/dL (ref 6.0–8.3)

## 2016-04-23 LAB — CBC WITH DIFFERENTIAL/PLATELET
BASOS ABS: 0.1 10*3/uL (ref 0.0–0.1)
Basophils Relative: 0.7 % (ref 0.0–3.0)
EOS ABS: 0.3 10*3/uL (ref 0.0–0.7)
Eosinophils Relative: 3.4 % (ref 0.0–5.0)
HCT: 44.5 % (ref 39.0–52.0)
Hemoglobin: 15.5 g/dL (ref 13.0–17.0)
LYMPHS ABS: 2.8 10*3/uL (ref 0.7–4.0)
Lymphocytes Relative: 31 % (ref 12.0–46.0)
MCHC: 34.8 g/dL (ref 30.0–36.0)
MCV: 100.5 fl — AB (ref 78.0–100.0)
Monocytes Absolute: 1.1 10*3/uL — ABNORMAL HIGH (ref 0.1–1.0)
Monocytes Relative: 12.3 % — ABNORMAL HIGH (ref 3.0–12.0)
NEUTROS ABS: 4.7 10*3/uL (ref 1.4–7.7)
NEUTROS PCT: 52.6 % (ref 43.0–77.0)
PLATELETS: 262 10*3/uL (ref 150.0–400.0)
RBC: 4.43 Mil/uL (ref 4.22–5.81)
RDW: 13.5 % (ref 11.5–15.5)
WBC: 9 10*3/uL (ref 4.0–10.5)

## 2016-04-23 LAB — BASIC METABOLIC PANEL
BUN: 14 mg/dL (ref 6–23)
CHLORIDE: 105 meq/L (ref 96–112)
CO2: 30 meq/L (ref 19–32)
Calcium: 9.7 mg/dL (ref 8.4–10.5)
Creatinine, Ser: 0.85 mg/dL (ref 0.40–1.50)
GFR: 95.06 mL/min (ref >60.00)
Glucose, Bld: 109 mg/dL — ABNORMAL HIGH (ref 70–99)
Potassium: 4.5 mEq/L (ref 3.5–5.1)
SODIUM: 142 meq/L (ref 135–145)

## 2016-04-23 LAB — LDL CHOLESTEROL, DIRECT: LDL DIRECT: 107 mg/dL

## 2016-04-23 LAB — LIPID PANEL
CHOL/HDL RATIO: 4
Cholesterol: 274 mg/dL — ABNORMAL HIGH (ref 0–200)
HDL: 62 mg/dL (ref >39.00)
Triglycerides: 464 mg/dL — ABNORMAL HIGH (ref 0.0–149.0)

## 2016-04-23 LAB — PSA: PSA: 1.66 ng/mL (ref 0.10–4.00)

## 2016-04-23 LAB — TSH: TSH: 2.16 u[IU]/mL (ref 0.35–4.50)

## 2016-04-23 MED ORDER — CIPROFLOXACIN HCL 500 MG PO TABS: 500.0000 mg | ORAL_TABLET | Freq: Two times a day (BID) | ORAL | 0 refills | Status: AC

## 2016-04-23 NOTE — Progress Notes (Signed)
Subjective:    Patient ID: Manuel Cruz, male    DOB: [birth year], 70 y.o.   MRN: [id number]  HPI Here to f/u; overall doing ok,  Pt denies chest pain, increasing sob or doe, wheezing, orthopnea, PND, increased LE swelling, palpitations, dizziness or syncope.  Pt denies new neurological symptoms such as new headache, or facial or extremity weakness or numbness.  Pt denies polydipsia, polyuria, or low sugar episode.   Pt denies new neurological symptoms such as new headache, or facial or extremity weakness or numbness.   Pt states overall good compliance with meds, mostly trying to follow appropriate diet, with wt overall stable,  but little exercise however.   Wt Readings from Last 3 Encounters:  04/23/16 156 lb (70.8 kg)  09/03/15 159 lb (72.1 kg)  07/23/15 161 lb (73 kg)  Does have several wks ongoing nasal allergy symptoms with clearish congestion, itch and sneezing, without fever, pain, ST, cough, swelling or wheezing.  Also for cipro for travel to [place] soon.  Denies worsening depressive symptoms, suicidal ideation, or panic.  Drinks only 2 glass wine a few nights per week. Past Medical History:  Diagnosis Date  . Allergic rhinitis   . Allergy   . GERD (gastroesophageal reflux disease)   . Hyperlipidemia   . Sciatica of right side    recurrent  . Sleep apnea    pt denies  . Squamous cell skin cancer, earlobe   . Warts, genital    Past Surgical History:  Procedure Laterality Date  . COLONOSCOPY  08/12/2005  . [place] SURGERY  2004    reports that he has been smoking Cigarettes.  He has been smoking about 0.25 packs per day. He has never used smokeless tobacco. He reports that he drinks about 6.0 oz of alcohol per week . He reports that he does not use drugs. family history includes Cancer in his mother; Hyperlipidemia in his brother and father. No Known Allergies Current Outpatient Prescriptions on File Prior to Visit  Medication Sig Dispense Refill  . aspirin 81 MG EC  tablet Take 81 mg by mouth daily.      Marland Kitchen atorvastatin (LIPITOR) 40 MG tablet Take 1 tablet (40 mg total) by mouth daily. 90 tablet 3  . Carboxymeth-Glycerin-Polysorb (REFRESH OPTIVE ADVANCED OP) Apply 1 drop to eye 2 (two) times daily.    . Cetirizine HCl (ZYRTEC ALLERGY) 10 MG CAPS Take by mouth as needed.    . gabapentin (NEURONTIN) 300 MG capsule TAKE 1 CAPSULE 3 TIMES A DAY 270 capsule 1  . ketotifen (ZADITOR) 0.025 % ophthalmic solution Place 1 drop into both eyes 2 (two) times daily.    Marland Kitchen omeprazole (PRILOSEC) 20 MG capsule TAKE 1 CAPSULE EVERY DAY 90 capsule 3   No current facility-administered medications on file prior to visit.    Review of Systems  Constitutional: Negative for unusual diaphoresis or night sweats HENT: Negative for ear swelling or discharge Eyes: Negative for worsening visual haziness  Respiratory: Negative for choking and stridor.   Gastrointestinal: Negative for distension or worsening eructation Genitourinary: Negative for retention or change in urine volume.  Musculoskeletal: Negative for other MSK pain or swelling Skin: Negative for color change and worsening wound Neurological: Negative for tremors and numbness other than noted  Psychiatric/Behavioral: Negative for decreased concentration or agitation other than above       Objective:   Physical Exam BP 126/78   Pulse 78   Temp 98.2 F (36.8 C) (Oral)  Resp 20   Wt 156 lb (70.8 kg)   SpO2 98%   BMI 23.72 kg/m  VS noted, not ill appearing Constitutional: Pt appears in no apparent distress HENT: Head: NCAT.  Right Ear: External ear normal.  Left Ear: External ear normal.  Eyes: . Pupils are equal, round, and reactive to light. Conjunctivae and EOM are normal Neck: Normal range of motion. Neck supple.  Cardiovascular: Normal rate and regular rhythm.   Pulmonary/Chest: Effort normal and breath sounds without rales or wheezing.  Abd:  Soft, NT, ND, + BS Neurological: Pt is alert. Not confused ,  motor grossly intact Skin: Skin is warm. No rash, no LE edema Psychiatric: Pt behavior is normal. No agitation. not depressed affect    Assessment & Plan:

## 2016-04-23 NOTE — Patient Instructions (Addendum)
Please take all new medication as prescribed - the cipro  Please continue all other medications as before, and refills have been done if requested.  Please have the pharmacy call with any other refills you may need.  Please continue your efforts at being more active, low cholesterol diet, and weight control.  You are otherwise up to date with prevention measures today.  Please keep your appointments with your specialists as you may have planned  Please return in 1 year for your yearly visit, or sooner if needed

## 2016-04-23 NOTE — Progress Notes (Signed)
Pre visit review using our clinic review tool, if applicable. No additional management support is needed unless otherwise documented below in the visit note. 

## 2016-04-24 LAB — HEPATITIS C ANTIBODY: HCV AB: NEGATIVE

## 2016-04-24 NOTE — Assessment & Plan Note (Signed)
With 2 glass wine per night only a few nights per wk,  to f/u any worsening symptoms or concerns

## 2016-04-24 NOTE — Assessment & Plan Note (Signed)
stable overall by history and exam, recent data reviewed with pt, and pt to continue medical treatment as before,  to f/u any worsening symptoms or concerns Lab Results  Component Value Date   WBC 9.0 04/23/2016   HGB 15.5 04/23/2016   HCT 44.5 04/23/2016   PLT 262.0 04/23/2016   GLUCOSE 109 (H) 04/23/2016   CHOL 274 (H) 04/23/2016   TRIG (H) 04/23/2016    464.0 Triglyceride is over 400; calculations on Lipids are invalid.   HDL 62.00 04/23/2016   LDLDIRECT 107.0 04/23/2016   LDLCALC 82 02/29/2012   ALT 49 04/23/2016   AST 41 (H) 04/23/2016   NA 142 04/23/2016   K 4.5 04/23/2016   CL 105 04/23/2016   CREATININE 0.85 04/23/2016   BUN 14 04/23/2016   CO2 30 04/23/2016   TSH 2.16 04/23/2016   PSA 1.66 04/23/2016   HGBA1C 5.5 04/17/2015

## 2016-04-24 NOTE — Assessment & Plan Note (Signed)
stable overall by history and exam, recent data reviewed with pt, and pt to continue medical treatment as before,  to f/u any worsening symptoms or concerns Lab Results  Component Value Date   LDLCALC 82 02/29/2012    

## 2016-04-24 NOTE — Assessment & Plan Note (Signed)
Mild to mod, for zyrtec/nasacort otc prn,  to f/u any worsening symptoms or concerns

## 2016-05-06 ENCOUNTER — Other Ambulatory Visit: Payer: Self-pay | Admitting: Physician Assistant

## 2016-05-06 DIAGNOSIS — D0439 Carcinoma in situ of skin of other parts of face: Secondary | ICD-10-CM | POA: Diagnosis not present

## 2016-05-06 DIAGNOSIS — D044 Carcinoma in situ of skin of scalp and neck: Secondary | ICD-10-CM | POA: Diagnosis not present

## 2016-05-06 DIAGNOSIS — D0462 Carcinoma in situ of skin of left upper limb, including shoulder: Secondary | ICD-10-CM | POA: Diagnosis not present

## 2016-07-07 DIAGNOSIS — Z23 Encounter for immunization: Secondary | ICD-10-CM | POA: Diagnosis not present

## 2016-08-10 DIAGNOSIS — L570 Actinic keratosis: Secondary | ICD-10-CM | POA: Diagnosis not present

## 2016-08-10 DIAGNOSIS — D239 Other benign neoplasm of skin, unspecified: Secondary | ICD-10-CM | POA: Diagnosis not present

## 2016-09-15 ENCOUNTER — Other Ambulatory Visit: Payer: Self-pay | Admitting: *Deleted

## 2016-09-15 DIAGNOSIS — E782 Mixed hyperlipidemia: Secondary | ICD-10-CM

## 2016-09-15 MED ORDER — ATORVASTATIN CALCIUM 40 MG PO TABS: 40.0000 mg | ORAL_TABLET | Freq: Every day | ORAL | 1 refills | Status: DC

## 2016-09-15 MED ORDER — ASPIRIN 81 MG PO TBEC: 81.0000 mg | DELAYED_RELEASE_TABLET | Freq: Every day | ORAL | 1 refills | Status: AC

## 2016-09-15 MED ORDER — OMEPRAZOLE 20 MG PO CPDR: 20.0000 mg | DELAYED_RELEASE_CAPSULE | Freq: Every day | ORAL | 1 refills | Status: DC

## 2016-09-15 MED ORDER — GABAPENTIN 300 MG PO CAPS: ORAL_CAPSULE | ORAL | 1 refills | Status: DC

## 2016-09-15 NOTE — Telephone Encounter (Signed)
Rec'd call pt states he now has to use [facility] for mail service. Requesting prescriptions to be sent. Inform pt will send electronically & update pharmacy...Manuel Cruz

## 2016-12-15 ENCOUNTER — Telehealth: Payer: Self-pay | Admitting: Internal Medicine

## 2016-12-15 NOTE — Telephone Encounter (Signed)
No labs are needed for an annual wellness visit with Manuel Cruz.

## 2016-12-15 NOTE — Telephone Encounter (Signed)
Scheduled annual wellness visit for patient. Patient wanted to know if he needs to have any labs drawn when he comes in for his visit on 01/04/17.

## 2017-01-03 NOTE — Progress Notes (Addendum)
Subjective:   Manuel Cruz is a 71 y.o. male who presents for an Initial Medicare Annual Wellness Visit.  Review of Systems  No ROS.  Medicare Wellness Visit.  Cardiac Risk Factors include: dyslipidemia;advanced age (>44men, >45 women);male gender Sleep patterns: has frequent nighttime awakenings, gets up 1 times nightly to void and sleeps 5-6 hours nightly.  Patient reports long-term insomnia issues, discussed recommended sleep tips and stress reduction tips, education was attached to patient's AVS.  Home Safety/Smoke Alarms: Feels safe in home. Smoke alarms in place.    Living environment; residence and Firearm Safety: 2-story house, no firearms. Lives alone, no needs for DME at this time Seat Belt Safety/Bike Helmet: Wears seat belt.   Counseling:   Eye Exam- appointment yearly  Dental- every 4 months, both dentist and periodontist  Male:   [facility]- Last 09/03/15, precancerous polyps, recall 5 years   PSA-  Lab Results  Component Value Date   PSA 1.66 04/23/2016   PSA 1.40 04/17/2015   PSA 1.27 04/10/2014     Objective:    Today's Vitals   01/04/17 1033 01/04/17 1039  BP: (!) 144/82   Pulse: 69   Resp: 20   SpO2: 99%   Weight: 157 lb (71.2 kg)   Height: 5\' 8"  (1.727 m)   PainSc:  3    Body mass index is 23.87 kg/m.  Current Medications (verified) Outpatient Encounter Prescriptions as of 01/04/2017  Medication Sig  . aspirin 81 MG EC tablet Take 1 tablet (81 mg total) by mouth daily.  Marland Kitchen atorvastatin (LIPITOR) 40 MG tablet Take 1 tablet (40 mg total) by mouth daily.  . Carboxymeth-Glycerin-Polysorb (REFRESH OPTIVE ADVANCED OP) Apply 1 drop to eye 2 (two) times daily.  . Cetirizine HCl (ZYRTEC ALLERGY) 10 MG CAPS Take by mouth as needed.  . gabapentin (NEURONTIN) 300 MG capsule TAKE 1 CAPSULE 3 TIMES A DAY  . ketotifen (ZADITOR) 0.025 % ophthalmic solution Place 1 drop into both eyes 2 (two) times daily.  . Multiple Vitamin (MULTIVITAMIN) tablet Take 1 tablet by  mouth daily.  Marland Kitchen omeprazole (PRILOSEC) 20 MG capsule Take 1 capsule (20 mg total) by mouth daily.   No facility-administered encounter medications on file as of 01/04/2017.     Allergies (verified) Patient has no known allergies.   History: Past Medical History:  Diagnosis Date  . Allergic rhinitis   . Allergy   . GERD (gastroesophageal reflux disease)   . Hyperlipidemia   . Sciatica of right side    recurrent  . Sleep apnea    pt denies  . Squamous cell skin cancer, earlobe   . Warts, genital    Past Surgical History:  Procedure Laterality Date  . COLONOSCOPY  08/12/2005  . [place] SURGERY  2004   Family History  Problem Relation Age of Onset  . Hyperlipidemia Father   . Hyperlipidemia Brother   . Cancer Mother     lung   Social History   Occupational History  . insurance    Social History Main Topics  . Smoking status: Current Every Day Smoker    Packs/day: 0.25    Types: Cigarettes  . Smokeless tobacco: Never Used  . Alcohol use 6.0 oz/week    10 Standard drinks or equivalent per week     Comment: Mckenna Boruff  . Drug use: No  . Sexual activity: Yes   Tobacco Counseling Ready to quit: Not Answered Counseling given: Not Answered   Activities of Daily Living In  your present state of health, do you have any difficulty performing the following activities: 01/04/2017  Hearing? N  Vision? N  Difficulty concentrating or making decisions? N  Walking or climbing stairs? N  Dressing or bathing? N  Doing errands, shopping? N  Preparing Food and eating ? N  Using the Toilet? N  In the past six months, have you accidently leaked urine? N  Do you have problems with loss of bowel control? N  Managing your Medications? N  Managing your Finances? N  Housekeeping or managing your Housekeeping? N  Some recent data might be hidden    Immunizations and Health Maintenance Immunization History  Administered Date(s) Administered  . H1N1 08/13/2008  . Influenza Whole  06/13/2008  . Influenza,inj,Quad PF,36+ Mos 06/11/2014  . Pneumococcal Conjugate-13 03/15/2013, 04/10/2014  . Pneumococcal Polysaccharide-23 04/17/2015  . Td 10/24/2008  . Zoster 01/19/2010   There are no preventive care reminders to display for this patient.  Patient Care Team: Biagio Borg, MD as PCP - General  Indicate any recent Medical Services you may have received from other than [facility] providers in the past year (date may be approximate).    Assessment:   This is a routine wellness examination for [name].Physical assessment deferred to PCP.  Hearing/Vision screen  Visual Acuity Screening   Right eye Left eye Both eyes  Without correction:   20/50  With correction:     Comments: Wears glasses   Hearing Screening Comments: Able to hear conversational tones w/o difficulty. No issues reported.  Passed whisper test   Dietary issues and exercise activities discussed: Current Exercise Habits: Home exercise routine, Type of exercise: walking, Time (Minutes): 40, Frequency (Times/Week): 6, Weekly Exercise (Minutes/Week): 240, Intensity: Mild  Diet (meal preparation, eat out, water intake, caffeinated beverages, dairy products, fruits and vegetables): well balanced, on average, 2 meals per day, on average, 1-2 servings fruit per day, on average, 2 servings vegetables per day  Patient states he typically does not read food labels, has not monitored fat/cholesterol or salt intake. Typically eats 2 light meals daily, limits caffeine, drinks 2-3 glasses of water daily.   Discussed with patient, reading labels, eating small frequent meals versus skipping meals, low fat/cholesterol diet, encouraged patient to increase daily water intake. Diet education was attached to patient's AVS.     Goals    . mix of ideas          Volunteering to help others, reading to spiritually enhance life, continue to be active, eat healthy, enjoy life.      Depression Screen PHQ 2/9 Scores 01/04/2017  04/23/2016 04/17/2015 04/10/2014  PHQ - 2 Score 1 0 0 1  PHQ- 9 Score 3 - - -    Fall Risk Fall Risk  01/04/2017 04/23/2016 04/17/2015 04/10/2014  Falls in the past year? No No No No  Cognitive Function:       Ad8 score reviewed for issues:  Issues making decisions: no  Less interest in hobbies / activities: no  Repeats questions, stories (family complaining): no  Trouble using ordinary gadgets (microwave, computer, phone): no  Forgets the month or year: no  Mismanaging finances: no  Remembering appts: no  Daily problems with thinking and/or memory: no Ad8 score is= 0  Screening Tests Health Maintenance  Topic Date Due  . INFLUENZA VACCINE  04/13/2017  . TETANUS/TDAP  10/24/2018  . COLONOSCOPY  09/02/2020  . Hepatitis C Screening  Completed  . PNA vac Low Risk Adult  Completed  Plan:    Continue to eat heart healthy diet (full of fruits, vegetables, whole grains, lean protein, water--limit salt, fat, and sugar intake) and increase physical activity as tolerated.  Continue doing brain stimulating activities (puzzles, reading, adult coloring books, staying active) to keep memory sharp.   During the course of the visit Mehran was educated and counseled about the following appropriate screening and preventive services:   Vaccines to include Pneumoccal, Influenza, Hepatitis B, Td, Zostavax, HCV  Colorectal cancer screening  Cardiovascular disease screening  Diabetes screening  Glaucoma screening  Nutrition counseling  Prostate cancer screening  Patient Instructions (the written plan) were given to the patient.   Michiel Cowboy, RN   01/04/2017    Medical screening examination/treatment/procedure(s) were performed by non-physician practitioner and as supervising physician I was immediately available for consultation/collaboration. I agree with above. Cathlean Cower, MD

## 2017-01-03 NOTE — Progress Notes (Signed)
Pre visit review using our clinic review tool, if applicable. No additional management support is needed unless otherwise documented below in the visit note. 

## 2017-01-04 ENCOUNTER — Ambulatory Visit (INDEPENDENT_AMBULATORY_CARE_PROVIDER_SITE_OTHER): Payer: Medicare Other | Admitting: *Deleted

## 2017-01-04 VITALS — BP 144/82 | HR 69 | Resp 20 | Ht 68.0 in | Wt 157.0 lb

## 2017-01-04 DIAGNOSIS — Z0000 Encounter for general adult medical examination without abnormal findings: Secondary | ICD-10-CM | POA: Diagnosis not present

## 2017-01-04 NOTE — Patient Instructions (Addendum)
Continue to eat heart healthy diet (full of fruits, vegetables, whole grains, lean protein, water--limit salt, fat, and sugar intake) and increase physical activity as tolerated.  Continue doing brain stimulating activities (puzzles, reading, adult coloring books, staying active) to keep memory sharp.    Manuel Cruz , Thank you for taking time to come for your Medicare Wellness Visit. I appreciate your ongoing commitment to your health goals. Please review the following plan we discussed and let me know if I can assist you in the future.   These are the goals we discussed: Goals    . mix of ideas          Volunteering to help others, reading to spiritually enhance life, continue to be active, eat healthy, enjoy life.       This is a list of the screening recommended for you and due dates:  Health Maintenance  Topic Date Due  . Flu Shot  04/13/2017  . Tetanus Vaccine  10/24/2018  . Colon Cancer Screening  09/02/2020  .  Hepatitis C: One time screening is recommended by Center for Disease Control  (CDC) for  adults born from [birth year] through 1964.   Completed  . Pneumonia vaccines  Completed    Stress and Stress Management Stress is a normal reaction to life events. It is what you feel when life demands more than you are used to or more than you can handle. Some stress can be useful. For example, the stress reaction can help you catch the last bus of the day, study for a test, or meet a deadline at work. But stress that occurs too often or for too long can cause problems. It can affect your emotional health and interfere with relationships and normal daily activities. Too much stress can weaken your immune system and increase your risk for physical illness. If you already have a medical problem, stress can make it worse. What are the causes? All sorts of life events may cause stress. An event that causes stress for one person may not be stressful for another person. Major life events commonly  cause stress. These may be positive or negative. Examples include losing your job, moving into a new home, getting married, having a baby, or losing a loved one. Less obvious life events may also cause stress, especially if they occur day after day or in combination. Examples include working long hours, driving in traffic, caring for children, being in debt, or being in a difficult relationship. What are the signs or symptoms? Stress may cause emotional symptoms including, the following:  Anxiety. This is feeling worried, afraid, on edge, overwhelmed, or out of control.  Anger. This is feeling irritated or impatient.  Depression. This is feeling sad, down, helpless, or guilty.  Difficulty focusing, remembering, or making decisions. Stress may cause physical symptoms, including the following:  Aches and pains. These may affect your head, neck, back, stomach, or other areas of your body.  Tight muscles or clenched jaw.  Low energy or trouble sleeping. Stress may cause unhealthy behaviors, including the following:  Eating to feel better (overeating) or skipping meals.  Sleeping too little, too much, or both.  Working too much or putting off tasks (procrastination).  Smoking, drinking alcohol, or using drugs to feel better. How is this diagnosed? Stress is diagnosed through an assessment by your health care provider. Your health care provider will ask questions about your symptoms and any stressful life events.Your health care provider will also ask about your  medical history and may order blood tests or other tests. Certain medical conditions and medicine can cause physical symptoms similar to stress. Mental illness can cause emotional symptoms and unhealthy behaviors similar to stress. Your health care provider may refer you to a mental health professional for further evaluation. How is this treated? Stress management is the recommended treatment for stress.The goals of stress  management are reducing stressful life events and coping with stress in healthy ways. Techniques for reducing stressful life events include the following:  Stress identification. Self-monitor for stress and identify what causes stress for you. These skills may help you to avoid some stressful events.  Time management. Set your priorities, keep a calendar of events, and learn to say "no." These tools can help you avoid making too many commitments. Techniques for coping with stress include the following:  Rethinking the problem. Try to think realistically about stressful events rather than ignoring them or overreacting. Try to find the positives in a stressful situation rather than focusing on the negatives.  Exercise. Physical exercise can release both physical and emotional tension. The key is to find a form of exercise you enjoy and do it regularly.  Relaxation techniques. These relax the body and mind. Examples include yoga, meditation, tai chi, biofeedback, deep breathing, progressive muscle relaxation, listening to music, being out in nature, journaling, and other hobbies. Again, the key is to find one or more that you enjoy and can do regularly.  Healthy lifestyle. Eat a balanced diet, get plenty of sleep, and do not smoke. Avoid using alcohol or drugs to relax.  Strong support network. Spend time with family, friends, or other people you enjoy being around.Express your feelings and talk things over with someone you trust. Counseling or talktherapy with a mental health professional may be helpful if you are having difficulty managing stress on your own. Medicine is typically not recommended for the treatment of stress.Talk to your health care provider if you think you need medicine for symptoms of stress. Follow these instructions at home:  Keep all follow-up visits as directed by your health care provider.  Take all medicines as directed by your health care provider. Contact a health  care provider if:  Your symptoms get worse or you start having new symptoms.  You feel overwhelmed by your problems and can no longer manage them on your own. Get help right away if:  You feel like hurting yourself or someone else. This information is not intended to replace advice given to you by your health care provider. Make sure you discuss any questions you have with your health care provider. Document Released: 02/23/2001 Document Revised: 02/05/2016 Document Reviewed: 04/24/2013 Elsevier Interactive Patient Education  2017 [place]. Insomnia Insomnia is a sleep disorder that makes it difficult to fall asleep or to stay asleep. Insomnia can cause tiredness (fatigue), low energy, difficulty concentrating, mood swings, and poor performance at work or school. There are three different ways to classify insomnia:  Difficulty falling asleep.  Difficulty staying asleep.  Waking up too early in the morning. Any type of insomnia can be long-term (chronic) or short-term (acute). Both are common. Short-term insomnia usually lasts for three months or less. Chronic insomnia occurs at least three times a week for longer than three months. What are the causes? Insomnia may be caused by another condition, situation, or substance, such as:  Anxiety.  Certain medicines.  Gastroesophageal reflux disease (GERD) or other gastrointestinal conditions.  Asthma or other breathing conditions.  Restless  legs syndrome, sleep apnea, or other sleep disorders.  Chronic pain.  Menopause. This may include hot flashes.  Stroke.  Abuse of alcohol, tobacco, or illegal drugs.  Depression.  Caffeine.  Neurological disorders, such as Alzheimer disease.  An overactive thyroid (hyperthyroidism). The cause of insomnia may not be known. What increases the risk? Risk factors for insomnia include:  Gender. Women are more commonly affected than men.  Age. Insomnia is more common as you get  older.  Stress. This may involve your professional or personal life.  Income. Insomnia is more common in people with lower income.  Lack of exercise.  Irregular work schedule or night shifts.  Traveling between different time zones. What are the signs or symptoms? If you have insomnia, trouble falling asleep or trouble staying asleep is the main symptom. This may lead to other symptoms, such as:  Feeling fatigued.  Feeling nervous about going to sleep.  Not feeling rested in the morning.  Having trouble concentrating.  Feeling irritable, anxious, or depressed. How is this treated? Treatment for insomnia depends on the cause. If your insomnia is caused by an underlying condition, treatment will focus on addressing the condition. Treatment may also include:  Medicines to help you sleep.  Counseling or therapy.  Lifestyle adjustments. Follow these instructions at home:  Take medicines only as directed by your health care provider.  Keep regular sleeping and waking hours. Avoid naps.  Keep a sleep diary to help you and your health care provider figure out what could be causing your insomnia. Include:  When you sleep.  When you wake up during the night.  How well you sleep.  How rested you feel the next day.  Any side effects of medicines you are taking.  What you eat and drink.  Make your bedroom a comfortable place where it is easy to fall asleep:  Put up shades or special blackout curtains to block light from outside.  Use a white noise machine to block noise.  Keep the temperature cool.  Exercise regularly as directed by your health care provider. Avoid exercising right before bedtime.  Use relaxation techniques to manage stress. Ask your health care provider to suggest some techniques that may work well for you. These may include:  Breathing exercises.  Routines to release muscle tension.  Visualizing peaceful scenes.  Cut back on alcohol,  caffeinated beverages, and cigarettes, especially close to bedtime. These can disrupt your sleep.  Do not overeat or eat spicy foods right before bedtime. This can lead to digestive discomfort that can make it hard for you to sleep.  Limit screen use before bedtime. This includes:  Watching TV.  Using your smartphone, tablet, and computer.  Stick to a routine. This can help you fall asleep faster. Try to do a quiet activity, brush your teeth, and go to bed at the same time each night.  Get out of bed if you are still awake after 15 minutes of trying to sleep. Keep the lights down, but try reading or doing a quiet activity. When you feel sleepy, go back to bed.  Make sure that you drive carefully. Avoid driving if you feel very sleepy.  Keep all follow-up appointments as directed by your health care provider. This is important. Contact a health care provider if:  You are tired throughout the day or have trouble in your daily routine due to sleepiness.  You continue to have sleep problems or your sleep problems get worse. Get help right away  if:  You have serious thoughts about hurting yourself or someone else. This information is not intended to replace advice given to you by your health care provider. Make sure you discuss any questions you have with your health care provider. Document Released: 08/27/2000 Document Revised: 01/30/2016 Document Reviewed: 05/31/2014 Elsevier Interactive Patient Education  2017 [place] that are packaged or in containers have a Nutrition Facts panel on the side or back. This is commonly called the food label. The food label helps you make healthy food choices by providing information about serving size and the amount of calories and various nutrients in the food. You can check the food label to find out if the food contains high or low amounts of nutrients that you want to limit in your diet. You can also use the food label  to see if the food is a good source of the nutrients that you want to make sure are included in your diet. How do I read the food label?  Begin by checking the serving size and number of servings in the container. All of the nutrition information listed on the food label is based on one serving. If you eat more than one serving, you must multiply the amounts (such as calories, grams of saturated fat, or milligrams of sodium) by the number of servings.  Check the calories. Choosing foods that are low in calories can help you manage your weight.  Look at the numbers in the % Daily Value column for each listed nutrient. This gives you an idea of how much of the daily recommended amount for that nutrient is provided in one serving of the food. A daily value of 5% or less is considered low. A daily value of 20% or more is considered high.  Check the amounts for the items you should limit in your diet. These include:  Total fat.  Saturated fat.  Trans fat.  Cholesterol.  Sodium.  Check the amounts for the items you should make sure you get enough of. These include:  Dietary fiber.  Vitamins A and C.  Calcium.  Iron. What information is provided on the food label? Serving information   Serving size.  The serving size is listed in cups or pieces. The nutrient amounts listed on the food label apply to this amount of the food.  Servings per container or package.  This shows the number of servings you can expect to get from the container or package if you follow the suggested serving size. Amount per serving   Calories.  The number of calories in one serving of the food. This information is helpful in managing weight. Low-calorie foods contain 40 calories or less. High-calorie foods contain 400 or more calories.  Calories from fat.  The number of calories that come from fat in one serving. Percent daily value  Percent daily value (shown on the label as % Daily Value) tells you  what percent of the daily value for each nutrient one serving provides. The daily value is the recommended amount of the nutrient that you should get each day. For example, if 15% is listed next to dietary fiber, it means that one serving of the food will give you 15% of the recommended amount of fiber that you should get in a day. The daily values are based on a 2,000-calorie-per-day diet. You may get more or less than 2,000 calories in your diet each day, but the % Daily Value gives you  an idea of whether the food contains a high or low amount of the listed nutrient. A daily value of 5% or less is low. A daily value of 20% or more is high. Total fat  Total fat shows you the total amount of fat in one serving (listed in grams). Foods with high amounts of fat usually have higher calories and may lead to weight gain. Two of the fats that make up a portion of the total fat are included on the label:  Saturated fat.  This number is the amount of saturated fat in one serving (listed in grams). Saturated fat increases the amount of blood cholesterol and should be limited to less than 7% of total calories each day. This means that if you eat 2,000 calories each day, you should eat less than 140 calories from saturated fat.   Cholesterol  The amount of cholesterol in one serving is listed in milligrams. Cholesterol should be limited to no more than 300 mg each day. Sodium  The amount of sodium in one serving is listed in milligrams. Most people should limit their sodium intake to 2,300 mg a day. Total carbohydrate  This number shows the amount of total carbohydrate in one serving (listed in grams). This information can help people with diabetes manage the amount of carbohydrate they eat. Two of the carbohydrates that make up a portion of the total carbohydrate are included on the label:  Dietary fiber.  The amount of dietary fiber in one serving is listed in grams. Most people should eat at least 25 g of  dietary fiber each day.  Sugars.  The amount of sugar in one serving is also listed in grams. This value includes both naturally occurring sugars from fruit and milk and added sugars such as honey or table sugar. Protein  The amount of protein in one serving is listed in grams. What other important labeling is on the food package? Ingredients  Food labels will list each ingredient in the food. The first ingredient listed is the ingredient that the food has the most of. The ingredients are listed in the order of their amount by weight from highest to lowest. Food allergen labeling  Food labels may also include a food allergen warning. Listed here are ingredients that can cause allergic reactions in some people. The potential allergens are listed behind the word "Contains" or "May contain." Examples of ingredients that may be listed are wheat, dairy, eggs, soy, and nuts. If a person knows that he or she is allergic to one of these ingredients, he or she will know to avoid that food. Where to find more information:  [place] Food and Drug Administration: [place] This information is not intended to replace advice given to you by your health care provider. Make sure you discuss any questions you have with your health care provider. Document Released: 08/30/2005 Document Revised: 04/28/2016 Document Reviewed: 07/23/2013 Elsevier Interactive Patient Education  2017 [place] and Cholesterol Restricted Diet Getting too much fat and cholesterol in your diet may cause health problems. Following this diet helps keep your fat and cholesterol at normal levels. This can keep you from getting sick. What types of fat should I choose?  Choose monosaturated and polyunsaturated fats. These are found in foods such as olive oil, canola oil, flaxseeds, walnuts, almonds, and seeds.  Eat more omega-3 fats. Good choices include salmon, mackerel, sardines, tuna, flaxseed oil, and ground flaxseeds.  Limit  saturated fats. These are in animal  products such as meats, butter, and cream. They can also be in plant products such as palm oil, palm kernel oil, and coconut oil.  Avoid foods with partially hydrogenated oils in them. These contain trans fats. Examples of foods that have trans fats are stick margarine, some tub margarines, cookies, crackers, and other baked goods. What general guidelines do I need to follow?  Check food labels. Look for the words "trans fat" and "saturated fat."  When preparing a meal:  Fill half of your plate with vegetables and green salads.  Fill one fourth of your plate with whole grains. Look for the word "whole" as the first word in the ingredient list.  Fill one fourth of your plate with lean protein foods.  Eat more foods that have fiber, like apples, carrots, beans, peas, and barley.  Eat more home-cooked foods. Eat less at restaurants and buffets.  Limit or avoid alcohol.  Limit foods high in starch and sugar.  Limit fried foods.  Cook foods without frying them. Baking, boiling, grilling, and broiling are all great options.  Lose weight if you are overweight. Losing even a small amount of weight can help your overall health. It can also help prevent diseases such as diabetes and heart disease. What foods can I eat? Grains  Whole grains, such as whole wheat or whole grain breads, crackers, cereals, and pasta. Unsweetened oatmeal, bulgur, barley, quinoa, or brown rice. Corn or whole wheat flour tortillas. Vegetables  Fresh or frozen vegetables (raw, steamed, roasted, or grilled). Green salads. Fruits  All fresh, canned (in natural juice), or frozen fruits. Meat and Other Protein Products  Ground beef (85% or leaner), grass-fed beef, or beef trimmed of fat. Skinless chicken or [place]. Ground chicken or [place]. Pork trimmed of fat. All fish and seafood. Eggs. Dried beans, peas, or lentils. Unsalted nuts or seeds. Unsalted canned or dry beans. Dairy    Low-fat dairy products, such as skim or 1% milk, 2% or reduced-fat cheeses, low-fat ricotta or cottage cheese, or plain low-fat yogurt. Fats and Oils  Tub margarines without trans fats. Light or reduced-fat mayonnaise and salad dressings. Avocado. Olive, canola, sesame, or safflower oils. Natural peanut or almond butter (choose ones without added sugar and oil). The items listed above may not be a complete list of recommended foods or beverages. Contact your dietitian for more options.  What foods are not recommended? Grains  White bread. White pasta. White rice. Cornbread. Bagels, pastries, and croissants. Crackers that contain trans fat. Vegetables  White potatoes. Corn. Creamed or fried vegetables. Vegetables in a cheese sauce. Fruits  Dried fruits. Canned fruit in light or heavy syrup. Fruit juice. Meat and Other Protein Products  Fatty cuts of meat. Ribs, chicken wings, bacon, sausage, bologna, salami, chitterlings, fatback, hot dogs, bratwurst, and packaged luncheon meats. Liver and organ meats. Dairy  Whole or 2% milk, cream, half-and-half, and cream cheese. Whole milk cheeses. Whole-fat or sweetened yogurt. Full-fat cheeses. Nondairy creamers and whipped toppings. Processed cheese, cheese spreads, or cheese curds. Sweets and Desserts  Corn syrup, sugars, honey, and molasses. Candy. Jam and jelly. Syrup. Sweetened cereals. Cookies, pies, cakes, donuts, muffins, and ice cream. Fats and Oils  Butter, stick margarine, lard, shortening, ghee, or bacon fat. Coconut, palm kernel, or palm oils. Beverages  Alcohol. Sweetened drinks (such as sodas, lemonade, and fruit drinks or punches). The items listed above may not be a complete list of foods and beverages to avoid. Contact your dietitian for more information.  This  information is not intended to replace advice given to you by your health care provider. Make sure you discuss any questions you have with your health care provider. Document  Released: 02/29/2012 Document Revised: 05/06/2016 Document Reviewed: 11/29/2013 Elsevier Interactive Patient Education  2017 [place] DASH stands for "Dietary Approaches to Stop Hypertension." The DASH eating plan is a healthy eating plan that has been shown to reduce high blood pressure (hypertension). It may also reduce your risk for type 2 diabetes, heart disease, and stroke. The DASH eating plan may also help with weight loss. What are tips for following this plan? General guidelines   Avoid eating more than 2,300 mg (milligrams) of salt (sodium) a day. If you have hypertension, you may need to reduce your sodium intake to 1,500 mg a day.  Limit alcohol intake to no more than 1 drink a day for nonpregnant women and 2 drinks a day for men. One drink equals 12 oz of beer, 5 oz of Chandi Nicklin, or 1 oz of hard liquor.  Work with your health care provider to maintain a healthy body weight or to lose weight. Ask what an ideal weight is for you.  Get at least 30 minutes of exercise that causes your heart to beat faster (aerobic exercise) most days of the week. Activities may include walking, swimming, or biking.  Work with your health care provider or diet and nutrition specialist (dietitian) to adjust your eating plan to your individual calorie needs. Reading food labels   Check food labels for the amount of sodium per serving. Choose foods with less than 5 percent of the Daily Value of sodium. Generally, foods with less than 300 mg of sodium per serving fit into this eating plan.  To find whole grains, look for the word "whole" as the first word in the ingredient list. Shopping   Buy products labeled as "low-sodium" or "no salt added."  Buy fresh foods. Avoid canned foods and premade or frozen meals. Cooking   Avoid adding salt when cooking. Use salt-free seasonings or herbs instead of table salt or sea salt. Check with your health care provider or pharmacist before  using salt substitutes.  Do not fry foods. Cook foods using healthy methods such as baking, boiling, grilling, and broiling instead.  Cook with heart-healthy oils, such as olive, canola, soybean, or sunflower oil. Meal planning    Eat a balanced diet that includes:  5 or more servings of fruits and vegetables each day. At each meal, try to fill half of your plate with fruits and vegetables.  Up to 6-8 servings of whole grains each day.  Less than 6 oz of lean meat, poultry, or fish each day. A 3-oz serving of meat is about the same size as a deck of cards. One egg equals 1 oz.  2 servings of low-fat dairy each day.  A serving of nuts, seeds, or beans 5 times each week.  Heart-healthy fats. Healthy fats called Omega-3 fatty acids are found in foods such as flaxseeds and coldwater fish, like sardines, salmon, and mackerel.  Limit how much you eat of the following:  Canned or prepackaged foods.  Food that is high in trans fat, such as fried foods.  Food that is high in saturated fat, such as fatty meat.  Sweets, desserts, sugary drinks, and other foods with added sugar.  Full-fat dairy products.  Do not salt foods before eating.  Try to eat at least 2 vegetarian meals each  week.  Eat more home-cooked food and less restaurant, buffet, and fast food.  When eating at a restaurant, ask that your food be prepared with less salt or no salt, if possible. What foods are recommended? The items listed may not be a complete list. Talk with your dietitian about what dietary choices are best for you. Grains  Whole-grain or whole-wheat bread. Whole-grain or whole-wheat pasta. Brown rice. Orpah Cobb. Bulgur. Whole-grain and low-sodium cereals. Pita bread. Low-fat, low-sodium crackers. Whole-wheat flour tortillas. Vegetables  Fresh or frozen vegetables (raw, steamed, roasted, or grilled). Low-sodium or reduced-sodium tomato and vegetable juice. Low-sodium or reduced-sodium tomato  sauce and tomato paste. Low-sodium or reduced-sodium canned vegetables. Fruits  All fresh, dried, or frozen fruit. Canned fruit in natural juice (without added sugar). Meat and other protein foods  Skinless chicken or [place]. Ground chicken or [place]. Pork with fat trimmed off. Fish and seafood. Egg whites. Dried beans, peas, or lentils. Unsalted nuts, nut butters, and seeds. Unsalted canned beans. Lean cuts of beef with fat trimmed off. Low-sodium, lean deli meat. Dairy  Low-fat (1%) or fat-free (skim) milk. Fat-free, low-fat, or reduced-fat cheeses. Nonfat, low-sodium ricotta or cottage cheese. Low-fat or nonfat yogurt. Low-fat, low-sodium cheese. Fats and oils  Soft margarine without trans fats. Vegetable oil. Low-fat, reduced-fat, or light mayonnaise and salad dressings (reduced-sodium). Canola, safflower, olive, soybean, and sunflower oils. Avocado. Seasoning and other foods  Herbs. Spices. Seasoning mixes without salt. Unsalted popcorn and pretzels. Fat-free sweets. What foods are not recommended? The items listed may not be a complete list. Talk with your dietitian about what dietary choices are best for you. Grains  Baked goods made with fat, such as croissants, muffins, or some breads. Dry pasta or rice meal packs. Vegetables  Creamed or fried vegetables. Vegetables in a cheese sauce. Regular canned vegetables (not low-sodium or reduced-sodium). Regular canned tomato sauce and paste (not low-sodium or reduced-sodium). Regular tomato and vegetable juice (not low-sodium or reduced-sodium). Rosita Fire. Olives. Fruits  Canned fruit in a light or heavy syrup. Fried fruit. Fruit in cream or butter sauce. Meat and other protein foods  Fatty cuts of meat. Ribs. Fried meat. Tomasa Blase. Sausage. Bologna and other processed lunch meats. Salami. Fatback. Hotdogs. Bratwurst. Salted nuts and seeds. Canned beans with added salt. Canned or smoked fish. Whole eggs or egg yolks. Chicken or [place] with  skin. Dairy  Whole or 2% milk, cream, and half-and-half. Whole or full-fat cream cheese. Whole-fat or sweetened yogurt. Full-fat cheese. Nondairy creamers. Whipped toppings. Processed cheese and cheese spreads. Fats and oils  Butter. Stick margarine. Lard. Shortening. Ghee. Bacon fat. Tropical oils, such as coconut, palm kernel, or palm oil. Seasoning and other foods  Salted popcorn and pretzels. Onion salt, garlic salt, seasoned salt, table salt, and sea salt. Worcestershire sauce. Tartar sauce. Barbecue sauce. Teriyaki sauce. Soy sauce, including reduced-sodium. Steak sauce. Canned and packaged gravies. Fish sauce. Oyster sauce. Cocktail sauce. Horseradish that you find on the shelf. Ketchup. Mustard. Meat flavorings and tenderizers. Bouillon cubes. Hot sauce and Tabasco sauce. Premade or packaged marinades. Premade or packaged taco seasonings. Relishes. Regular salad dressings. Where to find more information:  National Heart, Lung, and Blood Institute: PopSteam.is  American Heart Association: www.heart.org Summary  The DASH eating plan is a healthy eating plan that has been shown to reduce high blood pressure (hypertension). It may also reduce your risk for type 2 diabetes, heart disease, and stroke.  With the DASH eating plan, you should limit salt (sodium) intake to  2,300 mg a day. If you have hypertension, you may need to reduce your sodium intake to 1,500 mg a day.  When on the DASH eating plan, aim to eat more fresh fruits and vegetables, whole grains, lean proteins, low-fat dairy, and heart-healthy fats.  Work with your health care provider or diet and nutrition specialist (dietitian) to adjust your eating plan to your individual calorie needs. This information is not intended to replace advice given to you by your health care provider. Make sure you discuss any questions you have with your health care provider. Document Released: 08/19/2011 Document Revised: 08/23/2016 Document  Reviewed: 08/23/2016 Elsevier Interactive Patient Education  2017 Else[employer] Inc.   Fat and Cholesterol Restricted Diet High levels of fat and cholesterol in your blood may lead to various health problems, such as diseases of the heart, blood vessels, gallbladder, liver, and pancreas. Fats are concentrated sources of energy that come in various forms. Certain types of fat, including saturated fat, may be harmful in excess. Cholesterol is a substance needed by your body in small amounts. Your body makes all the cholesterol it needs. Excess cholesterol comes from the food you eat. When you have high levels of cholesterol and saturated fat in your blood, health problems can develop because the excess fat and cholesterol will gather along the walls of your blood vessels, causing them to narrow. Choosing the right foods will help you control your intake of fat and cholesterol. This will help keep the levels of these substances in your blood within normal limits and reduce your risk of disease. What is my plan? Your health care provider recommends that you:  Limit your fat intake to ______% or less of your total calories per day.  Limit the amount of cholesterol in your diet to less than _________mg per day.  Eat 20-30 grams of fiber each day. What types of fat should I choose?  Choose healthy fats more often. Choose monounsaturated and polyunsaturated fats, such as olive and canola oil, flaxseeds, walnuts, almonds, and seeds.  Eat more omega-3 fats. Good choices include salmon, mackerel, sardines, tuna, flaxseed oil, and ground flaxseeds. Aim to eat fish at least two times a week.  Limit saturated fats. Saturated fats are primarily found in animal products, such as meats, butter, and cream. Plant sources of saturated fats include palm oil, palm kernel oil, and coconut oil.  Avoid foods with partially hydrogenated oils in them. These contain trans fats. Examples of foods that contain trans fats are  stick margarine, some tub margarines, cookies, crackers, and other baked goods. What general guidelines do I need to follow? These guidelines for healthy eating will help you control your intake of fat and cholesterol:  Check food labels carefully to identify foods with trans fats or high amounts of saturated fat.  Fill one half of your plate with vegetables and green salads.  Fill one fourth of your plate with whole grains. Look for the word "whole" as the first word in the ingredient list.  Fill one fourth of your plate with lean protein foods.  Limit fruit to two servings a day. Choose fruit instead of juice.  Eat more foods that contain fiber, such as apples, broccoli, carrots, beans, peas, and barley.  Eat more home-cooked food and less restaurant, buffet, and fast food.  Limit or avoid alcohol.  Limit foods high in starch and sugar.  Limit fried foods.  Cook foods using methods other than frying. Baking, boiling, grilling, and broiling are all great  options.  Lose weight if you are overweight. Losing just 5-10% of your initial body weight can help your overall health and prevent diseases such as diabetes and heart disease. What foods can I eat? Grains   Whole grains, such as whole wheat or whole grain breads, crackers, cereals, and pasta. Unsweetened oatmeal, bulgur, barley, quinoa, or brown rice. Corn or whole wheat flour tortillas. Vegetables   Fresh or frozen vegetables (raw, steamed, roasted, or grilled). Green salads. Fruits   All fresh, canned (in natural juice), or frozen fruits. Meats and other protein foods   Ground beef (85% or leaner), grass-fed beef, or beef trimmed of fat. Skinless chicken or [place]. Ground chicken or [place]. Pork trimmed of fat. All fish and seafood. Eggs. Dried beans, peas, or lentils. Unsalted nuts or seeds. Unsalted canned or dry beans. Dairy   Low-fat dairy products, such as skim or 1% milk, 2% or reduced-fat cheeses, low-fat ricotta  or cottage cheese, or plain low-fat yo Fats and oils   Tub margarines without trans fats. Light or reduced-fat mayonnaise and salad dressings. Avocado. Olive, canola, sesame, or safflower oils. Natural peanut or almond butter (choose ones without added sugar and oil). The items listed above may not be a complete list of recommended foods or beverages. Contact your dietitian for more options.  Foods to avoid Grains   White bread. White pasta. White rice. Cornbread. Bagels, pastries, and croissants. Crackers that contain trans fat. Vegetables   White potatoes. Corn. Creamed or fried vegetables. Vegetables in a cheese sauce. Fruits   Dried fruits. Canned fruit in light or heavy syrup. Fruit juice. Meats and other protein foods   Fatty cuts of meat. Ribs, chicken wings, bacon, sausage, bologna, salami, chitterlings, fatback, hot dogs, bratwurst, and packaged luncheon meats. Liver and organ meats. Dairy   Whole or 2% milk, cream, half-and-half, and cream cheese. Whole milk cheeses. Whole-fat or sweetened yogurt. Full-fat cheeses. Nondairy creamers and whipped toppings. Processed cheese, cheese spreads, or cheese curds. Beverages   Alcohol. Sweetened drinks (such as sodas, lemonade, and fruit drinks or punches). Fats and oils   Butter, stick margarine, lard, shortening, ghee, or bacon fat. Coconut, palm kernel, or palm oils. Sweets and desserts   Corn syrup, sugars, honey, and molasses. Candy. Jam and jelly. Syrup. Sweetened cereals. Cookies, pies, cakes, donuts, muffins, and ice cream. The items listed above may not be a complete list of foods and beverages to avoid. Contact your dietitian for more information.  This information is not intended to replace advice given to you by your health care provider. Make sure you discuss any questions you have with your health care provider. Document Released: 08/30/2005 Document Revised: 09/20/2014 Document Reviewed: 11/28/2013 Elsevier Interactive  Patient Education  2017 [employer].

## 2017-02-01 DIAGNOSIS — H524 Presbyopia: Secondary | ICD-10-CM | POA: Diagnosis not present

## 2017-02-01 DIAGNOSIS — H5213 Myopia, bilateral: Secondary | ICD-10-CM | POA: Diagnosis not present

## 2017-02-01 DIAGNOSIS — H35363 Drusen (degenerative) of macula, bilateral: Secondary | ICD-10-CM | POA: Diagnosis not present

## 2017-02-01 DIAGNOSIS — H52223 Regular astigmatism, bilateral: Secondary | ICD-10-CM | POA: Diagnosis not present

## 2017-02-01 DIAGNOSIS — H04123 Dry eye syndrome of bilateral lacrimal glands: Secondary | ICD-10-CM | POA: Diagnosis not present

## 2017-02-01 DIAGNOSIS — H2513 Age-related nuclear cataract, bilateral: Secondary | ICD-10-CM | POA: Diagnosis not present

## 2017-02-01 DIAGNOSIS — H10413 Chronic giant papillary conjunctivitis, bilateral: Secondary | ICD-10-CM | POA: Diagnosis not present

## 2017-02-11 ENCOUNTER — Encounter (INDEPENDENT_AMBULATORY_CARE_PROVIDER_SITE_OTHER): Payer: Self-pay | Admitting: Ophthalmology

## 2017-02-18 ENCOUNTER — Encounter (INDEPENDENT_AMBULATORY_CARE_PROVIDER_SITE_OTHER): Payer: Medicare Other | Admitting: Ophthalmology

## 2017-02-18 DIAGNOSIS — H43813 Vitreous degeneration, bilateral: Secondary | ICD-10-CM

## 2017-02-18 DIAGNOSIS — H353131 Nonexudative age-related macular degeneration, bilateral, early dry stage: Secondary | ICD-10-CM

## 2017-02-18 DIAGNOSIS — H2513 Age-related nuclear cataract, bilateral: Secondary | ICD-10-CM

## 2017-04-26 ENCOUNTER — Ambulatory Visit (INDEPENDENT_AMBULATORY_CARE_PROVIDER_SITE_OTHER): Payer: Medicare Other | Admitting: Internal Medicine

## 2017-04-26 ENCOUNTER — Other Ambulatory Visit (INDEPENDENT_AMBULATORY_CARE_PROVIDER_SITE_OTHER): Payer: Medicare Other

## 2017-04-26 ENCOUNTER — Encounter: Payer: Self-pay | Admitting: Internal Medicine

## 2017-04-26 VITALS — BP 136/86 | HR 62 | Ht 68.0 in | Wt 156.0 lb

## 2017-04-26 DIAGNOSIS — E785 Hyperlipidemia, unspecified: Secondary | ICD-10-CM

## 2017-04-26 DIAGNOSIS — N320 Bladder-neck obstruction: Secondary | ICD-10-CM | POA: Diagnosis not present

## 2017-04-26 DIAGNOSIS — R7989 Other specified abnormal findings of blood chemistry: Secondary | ICD-10-CM | POA: Diagnosis not present

## 2017-04-26 DIAGNOSIS — F329 Major depressive disorder, single episode, unspecified: Secondary | ICD-10-CM

## 2017-04-26 DIAGNOSIS — J309 Allergic rhinitis, unspecified: Secondary | ICD-10-CM

## 2017-04-26 DIAGNOSIS — R739 Hyperglycemia, unspecified: Secondary | ICD-10-CM

## 2017-04-26 DIAGNOSIS — F32A Depression, unspecified: Secondary | ICD-10-CM

## 2017-04-26 LAB — CBC WITH DIFFERENTIAL/PLATELET
BASOS ABS: 0.1 10*3/uL (ref 0.0–0.1)
Basophils Relative: 1.4 % (ref 0.0–3.0)
Eosinophils Absolute: 0.2 10*3/uL (ref 0.0–0.7)
Eosinophils Relative: 3 % (ref 0.0–5.0)
HCT: 44.4 % (ref 39.0–52.0)
Hemoglobin: 15.1 g/dL (ref 13.0–17.0)
LYMPHS ABS: 2.1 10*3/uL (ref 0.7–4.0)
Lymphocytes Relative: 27 % (ref 12.0–46.0)
MCHC: 34 g/dL (ref 30.0–36.0)
MCV: 104 fl — ABNORMAL HIGH (ref 78.0–100.0)
MONO ABS: 1 10*3/uL (ref 0.1–1.0)
MONOS PCT: 12.5 % — AB (ref 3.0–12.0)
NEUTROS PCT: 56.1 % (ref 43.0–77.0)
Neutro Abs: 4.5 10*3/uL (ref 1.4–7.7)
PLATELETS: 326 10*3/uL (ref 150.0–400.0)
RBC: 4.27 Mil/uL (ref 4.22–5.81)
RDW: 12.9 % (ref 11.5–15.5)
WBC: 7.9 10*3/uL (ref 4.0–10.5)

## 2017-04-26 LAB — BASIC METABOLIC PANEL
BUN: 14 mg/dL (ref 6–23)
CALCIUM: 9.5 mg/dL (ref 8.4–10.5)
CHLORIDE: 101 meq/L (ref 96–112)
CO2: 28 mEq/L (ref 19–32)
Creatinine, Ser: 0.76 mg/dL (ref 0.40–1.50)
GFR: 107.85 mL/min (ref >60.00)
GLUCOSE: 130 mg/dL — AB (ref 70–99)
Potassium: 4.8 mEq/L (ref 3.5–5.1)
SODIUM: 136 meq/L (ref 135–145)

## 2017-04-26 LAB — PSA: PSA: 1.42 ng/mL (ref 0.10–4.00)

## 2017-04-26 LAB — HEPATIC FUNCTION PANEL
ALBUMIN: 4.4 g/dL (ref 3.5–5.2)
ALK PHOS: 84 U/L (ref 39–117)
ALT: 78 U/L — AB (ref 0–53)
AST: 70 U/L — ABNORMAL HIGH (ref 0–37)
Bilirubin, Direct: 0.1 mg/dL (ref 0.0–0.3)
TOTAL PROTEIN: 6.5 g/dL (ref 6.0–8.3)
Total Bilirubin: 0.7 mg/dL (ref 0.2–1.2)

## 2017-04-26 LAB — LDL CHOLESTEROL, DIRECT: Direct LDL: 63 mg/dL

## 2017-04-26 LAB — LIPID PANEL
CHOLESTEROL: 166 mg/dL (ref 0–200)
HDL: 61.3 mg/dL (ref >39.00)
Total CHOL/HDL Ratio: 3
Triglycerides: 463 mg/dL — ABNORMAL HIGH (ref 0.0–149.0)

## 2017-04-26 LAB — TSH: TSH: 1.96 u[IU]/mL (ref 0.35–4.50)

## 2017-04-26 LAB — HEMOGLOBIN A1C: Hgb A1c MFr Bld: 5.7 % (ref 4.6–6.5)

## 2017-04-26 MED ORDER — ATORVASTATIN CALCIUM 80 MG PO TABS: 80.0000 mg | ORAL_TABLET | Freq: Every day | ORAL | 3 refills | Status: DC

## 2017-04-26 MED ORDER — GABAPENTIN 300 MG PO CAPS: ORAL_CAPSULE | ORAL | 1 refills | Status: DC

## 2017-04-26 MED ORDER — ZOSTER VAC RECOMB ADJUVANTED 50 MCG/0.5ML IM SUSR: 0.5000 mL | Freq: Once | INTRAMUSCULAR | 1 refills | Status: AC

## 2017-04-26 MED ORDER — CETIRIZINE HCL 10 MG PO CAPS: 10.0000 mg | ORAL_CAPSULE | ORAL | 3 refills | Status: DC | PRN

## 2017-04-26 MED ORDER — OMEPRAZOLE 20 MG PO CPDR: 20.0000 mg | DELAYED_RELEASE_CAPSULE | Freq: Every day | ORAL | 3 refills | Status: DC

## 2017-04-26 NOTE — Assessment & Plan Note (Signed)
stable overall by history and exam, recent data reviewed with pt, and pt to continue medical treatment as before,  to f/u any worsening symptoms or concerns Lab Results  Component Value Date   LDLCALC 82 02/29/2012

## 2017-04-26 NOTE — Progress Notes (Signed)
Subjective:    Patient ID: Manuel Cruz, male    DOB: [birth year], 71 y.o.   MRN: [id number]  HPI Here for yearly f/u;  Overall doing ok;  Pt denies Chest pain, worsening SOB, DOE, wheezing, orthopnea, PND, worsening LE edema, palpitations, dizziness or syncope.  Pt denies neurological change such as new headache, facial or extremity weakness.  Pt denies polydipsia, polyuria, or low sugar symptoms. Pt states overall good compliance with treatment and medications, good tolerability, and has been trying to follow appropriate diet.  Pt denies worsening depressive symptoms, suicidal ideation or panic. No fever, night sweats, wt loss, loss of appetite, or other constitutional symptoms.  Pt states good ability with ADL's, has low fall risk, home safety reviewed and adequate, no other significant changes in hearing or vision, occasionally active with exercise. Wt Readings from Last 3 Encounters:  04/26/17 156 lb (70.8 kg)  01/04/17 157 lb (71.2 kg)  04/23/16 156 lb (70.8 kg)   BP Readings from Last 3 Encounters:  04/26/17 136/86  01/04/17 (!) 144/82  04/23/16 126/78  Has had some difficulty with sleep, better with melatonin.  Asks for shigles vaccin sent ot pharmacy.  Denies worsening reflux, abd pain, dysphagia, n/v, or blood.  Allergy symptoms stable on current meds and denies ongoing nasal allergy symptoms with clearish congestion, itch and sneezing, without fever, pain, ST, cough, swelling or wheezing.  Traveled to [place] last yr with episode diarrhea after better with cipro., but still having occasional loose stools, some better with probiotics.   Pt denies fever, wt loss, night sweats, loss of appetite, or other constitutional symptoms.   Past Medical History:  Diagnosis Date  . Allergic rhinitis   . Allergy   . GERD (gastroesophageal reflux disease)   . Hyperlipidemia   . Sciatica of right side    recurrent  . Sleep apnea    pt denies  . Squamous cell skin cancer, earlobe   . Warts,  genital    Past Surgical History:  Procedure Laterality Date  . COLONOSCOPY  08/12/2005  . [place] SURGERY  2004    reports that he has been smoking Cigarettes.  He has been smoking about 0.25 packs per day. He has never used smokeless tobacco. He reports that he drinks about 6.0 oz of alcohol per week . He reports that he does not use drugs. family history includes Cancer in his mother; Hyperlipidemia in his brother and father. No Known Allergies Current Outpatient Prescriptions on File Prior to Visit  Medication Sig Dispense Refill  . aspirin 81 MG EC tablet Take 1 tablet (81 mg total) by mouth daily. 90 tablet 1  . atorvastatin (LIPITOR) 40 MG tablet Take 1 tablet (40 mg total) by mouth daily. 90 tablet 1  . Carboxymeth-Glycerin-Polysorb (REFRESH OPTIVE ADVANCED OP) Apply 1 drop to eye 2 (two) times daily.    . Cetirizine HCl (ZYRTEC ALLERGY) 10 MG CAPS Take by mouth as needed.    . gabapentin (NEURONTIN) 300 MG capsule TAKE 1 CAPSULE 3 TIMES A DAY 270 capsule 1  . ketotifen (ZADITOR) 0.025 % ophthalmic solution Place 1 drop into both eyes 2 (two) times daily.    . Multiple Vitamin (MULTIVITAMIN) tablet Take 1 tablet by mouth daily.    Marland Kitchen omeprazole (PRILOSEC) 20 MG capsule Take 1 capsule (20 mg total) by mouth daily. 90 capsule 1   No current facility-administered medications on file prior to visit.    Review of Systems Constitutional: Negative for other unusual  diaphoresis, sweats, appetite or weight changes HENT: Negative for other worsening hearing loss, ear pain, facial swelling, mouth sores or neck stiffness.   Eyes: Negative for other worsening pain, redness or other visual disturbance.  Respiratory: Negative for other stridor or swelling Cardiovascular: Negative for other palpitations or other chest pain  Gastrointestinal: Negative for worsening diarrhea or loose stools, blood in stool, distention or other pain Genitourinary: Negative for hematuria, flank pain or other  change in urine volume.  Musculoskeletal: Negative for myalgias or other joint swelling.  Skin: Negative for other color change, or other wound or worsening drainage.  Neurological: Negative for other syncope or numbness. Hematological: Negative for other adenopathy or swelling Psychiatric/Behavioral: Negative for hallucinations, other worsening agitation, SI, self-injury, or new decreased concentration All other system neg per pt    Objective:   Physical Exam BP 136/86   Pulse 62   Ht 5\' 8"  (1.727 m)   Wt 156 lb (70.8 kg)   SpO2 98%   BMI 23.72 kg/m  VS noted,  Constitutional: Pt is oriented to person, place, and time. Appears well-developed and well-nourished, in no significant distress and comfortable Head: Normocephalic and atraumatic  Eyes: Conjunctivae and EOM are normal. Pupils are equal, round, and reactive to light Right Ear: External ear normal without discharge Left Ear: External ear normal without discharge Nose: Nose without discharge or deformity Mouth/Throat: Oropharynx is without other ulcerations and moist  Neck: Normal range of motion. Neck supple. No JVD present. No tracheal deviation present or significant neck LA or mass Cardiovascular: Normal rate, regular rhythm, normal heart sounds and intact distal pulses.   Pulmonary/Chest: WOB normal and breath sounds without rales or wheezing  Abdominal: Soft. Bowel sounds are normal. NT. No HSM  Musculoskeletal: Normal range of motion. Exhibits no edema Lymphadenopathy: Has no other cervical adenopathy.  Neurological: Pt is alert and oriented to person, place, and time. Pt has normal reflexes. No cranial nerve deficit. Motor grossly intact, Gait intact Skin: Skin is warm and dry. No rash noted or new ulcerations Psychiatric:  Has normal mood and affect. Behavior is normal without agitation No other exam findings Lab Results  Component Value Date   WBC 9.0 04/23/2016   HGB 15.5 04/23/2016   HCT 44.5 04/23/2016   PLT  262.0 04/23/2016   GLUCOSE 109 (H) 04/23/2016   CHOL 274 (H) 04/23/2016   TRIG (H) 04/23/2016    464.0 Triglyceride is over 400; calculations on Lipids are invalid.   HDL 62.00 04/23/2016   LDLDIRECT 107.0 04/23/2016   LDLCALC 82 02/29/2012   ALT 49 04/23/2016   AST 41 (H) 04/23/2016   NA 142 04/23/2016   K 4.5 04/23/2016   CL 105 04/23/2016   CREATININE 0.85 04/23/2016   BUN 14 04/23/2016   CO2 30 04/23/2016   TSH 2.16 04/23/2016   PSA 1.66 04/23/2016   HGBA1C 5.5 04/17/2015       Assessment & Plan:

## 2017-04-26 NOTE — Patient Instructions (Addendum)
You had the Shingles shot prescription sent to your [facility]  OK to increase the Lipitor to 80 mg per day  (sent to [facility])  Please continue all other medications as before, and refills have been done if requested.  Please have the pharmacy call with any other refills you may need.  Please continue your efforts at being more active, low cholesterol diet, and weight control.  You are otherwise up to date with prevention measures today.  Please keep your appointments with your specialists as you may have planned  Please go to the LAB in the Basement (turn left off the elevator) for the tests to be done today  You will be contacted by phone if any changes need to be made immediately.  Otherwise, you will receive a letter about your results with an explanation, but please check with MyChart first.  Please remember to sign up for MyChart if you have not done so, as this will be important to you in the future with finding out test results, communicating by private email, and scheduling acute appointments online when needed.  If you have Medicare related insurance (such as traditional Medicare, [employer] or [employer], or similar), Please make an appointment at the [facility] with Sharee Pimple, the [employer], for your Wellness Visit in this office, which is a benefit with your insurance.  Please return in 1 year for your yearly visit, or sooner if needed

## 2017-04-26 NOTE — Assessment & Plan Note (Signed)
Asympt, for a1c today, wt control

## 2017-04-26 NOTE — Assessment & Plan Note (Signed)
Stable, for cont'd zyrtec prn

## 2017-04-26 NOTE — Assessment & Plan Note (Signed)
Stable, cont same tx, declines need for counseling, asked to abstain from ETOH

## 2017-04-27 ENCOUNTER — Other Ambulatory Visit: Payer: Medicare Other

## 2017-04-27 LAB — URINALYSIS, ROUTINE W REFLEX MICROSCOPIC
BILIRUBIN URINE: NEGATIVE
HGB URINE DIPSTICK: NEGATIVE
Ketones, ur: NEGATIVE
LEUKOCYTES UA: NEGATIVE
Nitrite: NEGATIVE
PH: 5.5 (ref 5.0–8.0)
RBC / HPF: NONE SEEN (ref 0–?)
Specific Gravity, Urine: 1.025 (ref 1.000–1.030)
TOTAL PROTEIN, URINE-UPE24: NEGATIVE
URINE GLUCOSE: NEGATIVE
UROBILINOGEN UA: 0.2 (ref 0.0–1.0)
WBC, UA: NONE SEEN (ref 0–?)

## 2017-05-02 ENCOUNTER — Telehealth: Payer: Self-pay | Admitting: Internal Medicine

## 2017-05-02 NOTE — Telephone Encounter (Signed)
Pt notified he will take the half of the 80mg  tablet,  He states he was told to take half of his 40mg  tablet by Jenny Reichmann but it was never changed in his chart, so he has only been taking 20mg .

## 2017-05-02 NOTE — Telephone Encounter (Signed)
Actually pt has been on 40 mg lipitor since 2012, including with the last refill jan 2018  Pt is correct, though that his LDL was at target, and the increase to 80 mg was not needed  OK to take HALF of the 80 mg pill from now, and remind [place] next visit, so we can correct the rx

## 2017-05-02 NOTE — Telephone Encounter (Signed)
Called pt back no answer L:MOM RTC.../lmb 

## 2017-05-02 NOTE — Telephone Encounter (Signed)
Patient states that he was taking atorvastatin 20 mg a day.  Patient states he thought his labs were pretty good this year.  Does not understand why atorvastatin was moved to 80 mg a day.

## 2017-05-11 ENCOUNTER — Other Ambulatory Visit: Payer: Self-pay | Admitting: Physician Assistant

## 2017-05-11 DIAGNOSIS — L570 Actinic keratosis: Secondary | ICD-10-CM | POA: Diagnosis not present

## 2017-05-11 DIAGNOSIS — D492 Neoplasm of unspecified behavior of bone, soft tissue, and skin: Secondary | ICD-10-CM | POA: Diagnosis not present

## 2017-05-11 DIAGNOSIS — L814 Other melanin hyperpigmentation: Secondary | ICD-10-CM | POA: Diagnosis not present

## 2017-05-11 DIAGNOSIS — A630 Anogenital (venereal) warts: Secondary | ICD-10-CM | POA: Diagnosis not present

## 2017-06-07 DIAGNOSIS — Z23 Encounter for immunization: Secondary | ICD-10-CM | POA: Diagnosis not present

## 2017-09-13 HISTORY — PX: MOUTH SURGERY: SHX715

## 2018-01-06 NOTE — Progress Notes (Addendum)
Subjective:   Manuel Cruz is a 72 y.o. male who presents for Medicare Annual/Subsequent preventive examination.  Review of Systems:  No ROS.  Medicare Wellness Visit. Additional risk factors are reflected in the social history.  Cardiac Risk Factors include: advanced age (>73men, >63 women);dyslipidemia;male gender Sleep patterns: feels rested on waking, gets up 1-2 times nightly to void and sleeps 7 hours nightly.    Home Safety/Smoke Alarms: Feels safe in home. Smoke alarms in place.  Living environment; residence and Firearm Safety: 1-story house/ trailer, no firearms.Lives alone, no needs for DME, good support system. Seat Belt Safety/Bike Helmet: Wears seat belt.   PSA-  Lab Results  Component Value Date   PSA 1.42 04/26/2017   PSA 1.66 04/23/2016   PSA 1.40 04/17/2015       Objective:    Vitals: BP (!) 152/88   Pulse 76   Resp 18   Ht 5\' 8"  (1.727 m)   Wt 156 lb (70.8 kg)   SpO2 99%   BMI 23.72 kg/m   Body mass index is 23.72 kg/m.  Advanced Directives 01/09/2018 01/04/2017 09/03/2015 07/23/2015  Does Patient Have a Medical Advance Directive? Yes Yes Yes Yes  Type of Paramedic of [place];Living will [place];Living will Living will [place] in Chart? No - copy requested No - copy requested No - copy requested -    Tobacco Social History   Tobacco Use  Smoking Status Current Every Day Smoker  . Packs/day: 0.25  . Types: Cigarettes  Smokeless Tobacco Never Used     Ready to quit: Not Answered Counseling given: Not Answered  Past Medical History:  Diagnosis Date  . Allergic rhinitis   . Allergy   . GERD (gastroesophageal reflux disease)   . Hyperlipidemia   . Sciatica of right side    recurrent  . Sleep apnea    pt denies  . Squamous cell skin cancer, earlobe   . Warts, genital    Past Surgical History:  Procedure Laterality Date  .  COLONOSCOPY  08/12/2005  . [place] SURGERY  2004   Family History  Problem Relation Age of Onset  . Hyperlipidemia Father   . Hyperlipidemia Brother   . Cancer Mother        lung   Social History   Socioeconomic History  . Marital status: Single    Spouse name: Not on file  . Number of children: 0  . Years of education: Not on file  . Highest education level: Not on file  Occupational History  . Occupation: Science writer  . Financial resource strain: Not hard at all  . Food insecurity:    Worry: Never true    Inability: Never true  . Transportation needs:    Medical: No    Non-medical: No  Tobacco Use  . Smoking status: Current Every Day Smoker    Packs/day: 0.25    Types: Cigarettes  . Smokeless tobacco: Never Used  Substance and Sexual Activity  . Alcohol use: Yes    Alcohol/week: 6.0 oz    Types: 10 Standard drinks or equivalent per week    Comment: Nadja Lina  . Drug use: No  . Sexual activity: Yes  Lifestyle  . Physical activity:    Days per week: 3 days    Minutes per session: 60 min  . Stress: Not at all  Relationships  . Social connections:  Talks on phone: More than three times a week    Gets together: More than three times a week    Attends religious service: More than 4 times per year    Active member of club or organization: Yes    Attends meetings of clubs or organizations: 1 to 4 times per year    Relationship status: Not on file  Other Topics Concern  . Not on file  Social History Narrative  . Not on file    Outpatient Encounter Medications as of 01/09/2018  Medication Sig  . aspirin 81 MG EC tablet Take 1 tablet (81 mg total) by mouth daily.  Marland Kitchen atorvastatin (LIPITOR) 80 MG tablet Take 1 tablet (80 mg total) by mouth daily.  . Carboxymeth-Glycerin-Polysorb (REFRESH OPTIVE ADVANCED OP) Apply 1 drop to eye 2 (two) times daily.  . Cetirizine HCl (ZYRTEC ALLERGY) 10 MG CAPS Take 1 capsule (10 mg total) by mouth as needed.  .  gabapentin (NEURONTIN) 300 MG capsule TAKE 1 CAPSULE 3 TIMES A DAY  . ketotifen (ZADITOR) 0.025 % ophthalmic solution Place 1 drop into both eyes 2 (two) times daily.  . Melatonin 3 MG TABS Take by mouth.  . Multiple Vitamin (MULTIVITAMIN) tablet Take 1 tablet by mouth daily.  . naproxen (NAPROSYN) 250 MG tablet Take by mouth 2 (two) times daily with a meal.  . omeprazole (PRILOSEC) 20 MG capsule Take 1 capsule (20 mg total) by mouth daily.  . Probiotic Product (PROBIOTIC PO) Take by mouth.   No facility-administered encounter medications on file as of 01/09/2018.     Activities of Daily Living In your present state of health, do you have any difficulty performing the following activities: 01/09/2018  Hearing? N  Vision? N  Difficulty concentrating or making decisions? N  Walking or climbing stairs? N  Dressing or bathing? N  Doing errands, shopping? N  Preparing Food and eating ? N  Using the Toilet? N  In the past six months, have you accidently leaked urine? N  Do you have problems with loss of bowel control? N  Managing your Medications? N  Managing your Finances? N  Housekeeping or managing your Housekeeping? N  Some recent data might be hidden    Patient Care Team: Biagio Borg, MD as PCP - General   Assessment:   This is a routine wellness examination for Manuel Cruz. Physical assessment deferred to PCP.   Exercise Activities and Dietary recommendations Current Exercise Habits: Home exercise routine, Type of exercise: walking, Time (Minutes): 50, Frequency (Times/Week): 3, Weekly Exercise (Minutes/Week): 150, Intensity: Mild, Exercise limited by: orthopedic condition(s)  Diet (meal preparation, eat out, water intake, caffeinated beverages, dairy products, fruits and vegetables): in general, a "healthy" diet  ,   Reviewed heart healthy diet, encouraged patient to increase daily water intake. Diet education was attached to patient's AVS.  Goals    . Patient Stated     I want  to tweak a few things, like increase water intake to 2 glasses daily, eat heart healthy to lower triglycerides and blood pressure. I will watch how much salt and saturated fat I eat.       Fall Risk Fall Risk  01/09/2018 04/26/2017 01/04/2017 04/23/2016 04/17/2015  Falls in the past year? No No No No No  Risk for fall due to : Impaired mobility;Impaired balance/gait - - - -    Depression Screen PHQ 2/9 Scores 01/09/2018 04/26/2017 01/04/2017 04/23/2016  PHQ - 2 Score 1 2 1  0  PHQ- 9 Score 1 3 3  -    Cognitive Function       Ad8 score reviewed for issues:  Issues making decisions: no  Less interest in hobbies / activities: no  Repeats questions, stories (family complaining): no  Trouble using ordinary gadgets (microwave, computer, phone):no  Forgets the month or year: no  Mismanaging finances: no  Remembering appts: no  Daily problems with thinking and/or memory: no Ad8 score is= 0  Immunization History  Administered Date(s) Administered  . H1N1 08/13/2008  . Influenza Whole 06/13/2008  . Influenza,inj,Quad PF,6+ Mos 06/11/2014  . Pneumococcal Conjugate-13 03/15/2013, 04/10/2014  . Pneumococcal Polysaccharide-23 04/17/2015  . Td 10/24/2008  . Zoster 01/19/2010  . Zoster Recombinat (Shingrix) 06/02/2017, 11/27/2017   Screening Tests Health Maintenance  Topic Date Due  . INFLUENZA VACCINE  04/13/2018  . TETANUS/TDAP  10/24/2018  . COLONOSCOPY  09/02/2020  . Hepatitis C Screening  Completed  . PNA vac Low Risk Adult  Completed      Plan:    Continue doing brain stimulating activities (puzzles, reading, adult coloring books, staying active) to keep memory sharp.   Continue to eat heart healthy diet (full of fruits, vegetables, whole grains, lean protein, water--limit salt, fat, and sugar intake) and increase physical activity as tolerated.  I have personally reviewed and noted the following in the patient's chart:   . Medical and social history . Use of alcohol,  tobacco or illicit drugs  . Current medications and supplements . Functional ability and status . Nutritional status . Physical activity . Advanced directives . List of other physicians . Vitals . Screenings to include cognitive, depression, and falls . Referrals and appointments  In addition, I have reviewed and discussed with patient certain preventive protocols, quality metrics, and best practice recommendations. A written personalized care plan for preventive services as well as general preventive health recommendations were provided to patient.     Michiel Cowboy, RN  01/09/2018  Medical screening examination/treatment/procedure(s) were performed by non-physician practitioner and as supervising physician I was immediately available for consultation/collaboration. I agree with above. Cathlean Cower, MD

## 2018-01-09 ENCOUNTER — Ambulatory Visit (INDEPENDENT_AMBULATORY_CARE_PROVIDER_SITE_OTHER): Payer: Medicare Other | Admitting: *Deleted

## 2018-01-09 VITALS — BP 152/88 | HR 76 | Resp 18 | Ht 68.0 in | Wt 156.0 lb

## 2018-01-09 DIAGNOSIS — Z0000 Encounter for general adult medical examination without abnormal findings: Secondary | ICD-10-CM

## 2018-01-09 NOTE — Patient Instructions (Addendum)
Continue doing brain stimulating activities (puzzles, reading, adult coloring books, staying active) to keep memory sharp.   Continue to eat heart healthy diet (full of fruits, vegetables, whole grains, lean protein, water--limit salt, fat, and sugar intake) and increase physical activity as tolerated.   Manuel Cruz , Thank you for taking time to come for your Medicare Wellness Visit. I appreciate your ongoing commitment to your health goals. Please review the following plan we discussed and let me know if I can assist you in the future.   These are the goals we discussed: Goals    . Patient Stated     I want to tweak a few things, like increase water intake to 2 glasses daily, eat heart healthy to lower triglycerides and blood pressure. I will watch how much salt and saturated fats.        This is a list of the screening recommended for you and due dates:  Health Maintenance  Topic Date Due  . Flu Shot  04/13/2018  . Tetanus Vaccine  10/24/2018  . Colon Cancer Screening  09/02/2020  .  Hepatitis C: One time screening is recommended by Center for Disease Control  (CDC) for  adults born from [birth year] through 1964.   Completed  . Pneumonia vaccines  Completed      Heart-Healthy Eating Plan Heart-healthy meal planning includes:  Limiting unhealthy fats.  Increasing healthy fats.  Making other small dietary changes.  You may need to talk with your doctor or a diet specialist (dietitian) to create an eating plan that is right for you. What types of fat should I choose?  Choose healthy fats. These include olive oil and canola oil, flaxseeds, walnuts, almonds, and seeds.  Eat more omega-3 fats. These include salmon, mackerel, sardines, tuna, flaxseed oil, and ground flaxseeds. Try to eat fish at least twice each week.  Limit saturated fats. ? Saturated fats are often found in animal products, such as meats, butter, and cream. ? Plant sources of saturated fats include palm oil, palm  kernel oil, and coconut oil.  Avoid foods with partially hydrogenated oils in them. These include stick margarine, some tub margarines, cookies, crackers, and other baked goods. These contain trans fats. What general guidelines do I need to follow?  Check food labels carefully. Identify foods with trans fats or high amounts of saturated fat.  Fill one half of your plate with vegetables and green salads. Eat 4-5 servings of vegetables per day. A serving of vegetables is: ? 1 cup of raw leafy vegetables. ?  cup of raw or cooked cut-up vegetables. ?  cup of vegetable juice.  Fill one fourth of your plate with whole grains. Look for the word "whole" as the first word in the ingredient list.  Fill one fourth of your plate with lean protein foods.  Eat 4-5 servings of fruit per day. A serving of fruit is: ? One medium whole fruit. ?  cup of dried fruit. ?  cup of fresh, frozen, or canned fruit. ?  cup of 100% fruit juice.  Eat more foods that contain soluble fiber. These include apples, broccoli, carrots, beans, peas, and barley. Try to get 20-30 g of fiber per day.  Eat more home-cooked food. Eat less restaurant, buffet, and fast food.  Limit or avoid alcohol.  Limit foods high in starch and sugar.  Avoid fried foods.  Avoid frying your food. Try baking, boiling, grilling, or broiling it instead. You can also reduce fat by: ? Removing  the skin from poultry. ? Removing all visible fats from meats. ? Skimming the fat off of stews, soups, and gravies before serving them. ? Steaming vegetables in water or broth.  Lose weight if you are overweight.  Eat 4-5 servings of nuts, legumes, and seeds per week: ? One serving of dried beans or legumes equals  cup after being cooked. ? One serving of nuts equals 1 ounces. ? One serving of seeds equals  ounce or one tablespoon.  You may need to keep track of how much salt or sodium you eat. This is especially true if you have high  blood pressure. Talk with your doctor or dietitian to get more information. What foods can I eat? Grains Breads, including [place], white, pita, wheat, raisin, rye, oatmeal, and [place]. Tortillas that are neither fried nor made with lard or trans fat. Low-fat rolls, including hotdog and hamburger buns and English muffins. Biscuits. Muffins. Waffles. Pancakes. Light popcorn. Whole-grain cereals. Flatbread. Melba toast. Pretzels. Breadsticks. Rusks. Low-fat snacks. Low-fat crackers, including oyster, saltine, matzo, graham, animal, and rye. Rice and pasta, including brown rice and pastas that are made with whole wheat. Vegetables All vegetables. Fruits All fruits, but limit coconut. Meats and Other Protein Sources Lean, well-trimmed beef, veal, pork, and lamb. Chicken and [place] without skin. All fish and shellfish. Wild duck, rabbit, pheasant, and venison. Egg whites or low-cholesterol egg substitutes. Dried beans, peas, lentils, and tofu. Seeds and most nuts. Dairy Low-fat or nonfat cheeses, including ricotta, string, and mozzarella. Skim or 1% milk that is liquid, powdered, or evaporated. Buttermilk that is made with low-fat milk. Nonfat or low-fat yogurt. Beverages Mineral water. Diet carbonated beverages. Sweets and Desserts Sherbets and fruit ices. Honey, jam, marmalade, jelly, and syrups. Meringues and gelatins. Pure sugar candy, such as hard candy, jelly beans, gumdrops, mints, marshmallows, and small amounts of dark chocolate. [employer] Eat all sweets and desserts in moderation. Fats and Oils Nonhydrogenated (trans-free) margarines. Vegetable oils, including soybean, sesame, sunflower, olive, peanut, safflower, corn, canola, and cottonseed. Salad dressings or mayonnaise made with a vegetable oil. Limit added fats and oils that you use for cooking, baking, salads, and as spreads. Other Cocoa powder. Coffee and tea. All seasonings and condiments. The items listed above may not be a  complete list of recommended foods or beverages. Contact your dietitian for more options. What foods are not recommended? Grains Breads that are made with saturated or trans fats, oils, or whole milk. Croissants. Butter rolls. Cheese breads. Sweet rolls. Donuts. Buttered popcorn. Chow mein noodles. High-fat crackers, such as cheese or butter crackers. Meats and Other Protein Sources Fatty meats, such as hotdogs, short ribs, sausage, spareribs, bacon, rib eye roast or steak, and mutton. High-fat deli meats, such as salami and bologna. Caviar. Domestic duck and goose. Organ meats, such as kidney, liver, sweetbreads, and heart. Dairy Cream, sour cream, cream cheese, and creamed cottage cheese. Whole-milk cheeses, including blue (bleu), [place], [place], [place], [place], [place], Swiss, cheddar, [place], and [place]. Whole or 2% milk that is liquid, evaporated, or condensed. Whole buttermilk. Cream sauce or high-fat cheese sauce. Yogurt that is made from whole milk. Beverages Regular sodas and juice drinks with added sugar. Sweets and Desserts Frosting. Pudding. Cookies. Cakes other than angel food cake. Candy that has milk chocolate or white chocolate, hydrogenated fat, butter, coconut, or unknown ingredients. Buttered syrups. Full-fat ice cream or ice cream drinks. Fats and Oils Gravy that has suet, meat fat, or shortening. Cocoa butter, hydrogenated oils,  palm oil, coconut oil, palm kernel oil. These can often be found in baked products, candy, fried foods, nondairy creamers, and whipped toppings. Solid fats and shortenings, including bacon fat, salt pork, lard, and butter. Nondairy cream substitutes, such as coffee creamers and sour cream substitutes. Salad dressings that are made of unknown oils, cheese, or sour cream. The items listed above may not be a complete list of foods and beverages to avoid. Contact your dietitian for more information. This information is not intended to replace advice  given to you by your health care provider. Make sure you discuss any questions you have with your health care provider. Document Released: 02/29/2012 Document Revised: 02/05/2016 Document Reviewed: 02/21/2014 Elsevier Interactive Patient Education  2018 [place] Eating Plan DASH stands for "Dietary Approaches to Stop Hypertension." The DASH eating plan is a healthy eating plan that has been shown to reduce high blood pressure (hypertension). It may also reduce your risk for type 2 diabetes, heart disease, and stroke. The DASH eating plan may also help with weight loss. What are tips for following this plan? General guidelines  Avoid eating more than 2,300 mg (milligrams) of salt (sodium) a day. If you have hypertension, you may need to reduce your sodium intake to 1,500 mg a day.  Limit alcohol intake to no more than 1 drink a day for nonpregnant women and 2 drinks a day for men. One drink equals 12 oz of beer, 5 oz of Angelize Ryce, or 1 oz of hard liquor.  Work with your health care provider to maintain a healthy body weight or to lose weight. Ask what an ideal weight is for you.  Get at least 30 minutes of exercise that causes your heart to beat faster (aerobic exercise) most days of the week. Activities may include walking, swimming, or biking.  Work with your health care provider or diet and nutrition specialist (dietitian) to adjust your eating plan to your individual calorie needs. Reading food labels  Check food labels for the amount of sodium per serving. Choose foods with less than 5 percent of the Daily Value of sodium. Generally, foods with less than 300 mg of sodium per serving fit into this eating plan.  To find whole grains, look for the word "whole" as the first word in the ingredient list. Shopping  Buy products labeled as "low-sodium" or "no salt added."  Buy fresh foods. Avoid canned foods and premade or frozen meals. Cooking  Avoid adding salt when cooking. Use  salt-free seasonings or herbs instead of table salt or sea salt. Check with your health care provider or pharmacist before using salt substitutes.  Do not fry foods. Cook foods using healthy methods such as baking, boiling, grilling, and broiling instead.  Cook with heart-healthy oils, such as olive, canola, soybean, or sunflower oil. Meal planning   Eat a balanced diet that includes: ? 5 or more servings of fruits and vegetables each day. At each meal, try to fill half of your plate with fruits and vegetables. ? Up to 6-8 servings of whole grains each day. ? Less than 6 oz of lean meat, poultry, or fish each day. A 3-oz serving of meat is about the same size as a deck of cards. One egg equals 1 oz. ? 2 servings of low-fat dairy each day. ? A serving of nuts, seeds, or beans 5 times each week. ? Heart-healthy fats. Healthy fats called Omega-3 fatty acids are found in foods such as flaxseeds and [place]  fish, like sardines, salmon, and mackerel.  Limit how much you eat of the following: ? Canned or prepackaged foods. ? Food that is high in trans fat, such as fried foods. ? Food that is high in saturated fat, such as fatty meat. ? Sweets, desserts, sugary drinks, and other foods with added sugar. ? Full-fat dairy products.  Do not salt foods before eating.  Try to eat at least 2 vegetarian meals each week.  Eat more home-cooked food and less restaurant, buffet, and fast food.  When eating at a restaurant, ask that your food be prepared with less salt or no salt, if possible. What foods are recommended? The items listed may not be a complete list. Talk with your dietitian about what dietary choices are best for you. Grains Whole-grain or whole-wheat bread. Whole-grain or whole-wheat pasta. Brown rice. [place]. Bulgur. Whole-grain and low-sodium cereals. Pita bread. Low-fat, low-sodium crackers. Whole-wheat flour tortillas. Vegetables Fresh or frozen vegetables (raw, steamed,  roasted, or grilled). Low-sodium or reduced-sodium tomato and vegetable juice. Low-sodium or reduced-sodium tomato sauce and tomato paste. Low-sodium or reduced-sodium canned vegetables. Fruits All fresh, dried, or frozen fruit. Canned fruit in natural juice (without added sugar). Meat and other protein foods Skinless chicken or [place]. Ground chicken or [place]. Pork with fat trimmed off. Fish and seafood. Egg whites. Dried beans, peas, or lentils. Unsalted nuts, nut butters, and seeds. Unsalted canned beans. Lean cuts of beef with fat trimmed off. Low-sodium, lean deli meat. Dairy Low-fat (1%) or fat-free (skim) milk. Fat-free, low-fat, or reduced-fat cheeses. Nonfat, low-sodium ricotta or cottage cheese. Low-fat or nonfat yogurt. Low-fat, low-sodium cheese. Fats and oils Soft margarine without trans fats. Vegetable oil. Low-fat, reduced-fat, or light mayonnaise and salad dressings (reduced-sodium). Canola, safflower, olive, soybean, and sunflower oils. Avocado. Seasoning and other foods Herbs. Spices. Seasoning mixes without salt. Unsalted popcorn and pretzels. Fat-free sweets. What foods are not recommended? The items listed may not be a complete list. Talk with your dietitian about what dietary choices are best for you. Grains Baked goods made with fat, such as croissants, muffins, or some breads. Dry pasta or rice meal packs. Vegetables Creamed or fried vegetables. Vegetables in a cheese sauce. Regular canned vegetables (not low-sodium or reduced-sodium). Regular canned tomato sauce and paste (not low-sodium or reduced-sodium). Regular tomato and vegetable juice (not low-sodium or reduced-sodium). Manuel Cruz. Olives. Fruits Canned fruit in a light or heavy syrup. Fried fruit. Fruit in cream or butter sauce. Meat and other protein foods Fatty cuts of meat. Ribs. Fried meat. Manuel Cruz. Sausage. Bologna and other processed lunch meats. Salami. [place]. Hotdogs. Bratwurst. Salted nuts and seeds. Canned  beans with added salt. Canned or smoked fish. Whole eggs or egg yolks. Chicken or [place] with skin. Dairy Whole or 2% milk, cream, and half-and-half. Whole or full-fat cream cheese. Whole-fat or sweetened yogurt. Full-fat cheese. Nondairy creamers. Whipped toppings. Processed cheese and cheese spreads. Fats and oils Butter. Stick margarine. Lard. Shortening. Ghee. Bacon fat. Tropical oils, such as coconut, palm kernel, or palm oil. Seasoning and other foods Salted popcorn and pretzels. Onion salt, garlic salt, seasoned salt, table salt, and sea salt. Worcestershire sauce. Tartar sauce. Barbecue sauce. Teriyaki sauce. Soy sauce, including reduced-sodium. Steak sauce. Canned and packaged gravies. Fish sauce. Oyster sauce. Cocktail sauce. Horseradish that you find on the shelf. Ketchup. Mustard. Meat flavorings and tenderizers. Bouillon cubes. Hot sauce and Tabasco sauce. Premade or packaged marinades. Premade or packaged taco seasonings. Relishes. Regular salad dressings. Where to find more  information:  National Heart, Lung, and Blood Institute: https://wilson-eaton.com/  American Heart Association: www.heart.org Summary  The DASH eating plan is a healthy eating plan that has been shown to reduce high blood pressure (hypertension). It may also reduce your risk for type 2 diabetes, heart disease, and stroke.  With the DASH eating plan, you should limit salt (sodium) intake to 2,300 mg a day. If you have hypertension, you may need to reduce your sodium intake to 1,500 mg a day.  When on the DASH eating plan, aim to eat more fresh fruits and vegetables, whole grains, lean proteins, low-fat dairy, and heart-healthy fats.  Work with your health care provider or diet and nutrition specialist (dietitian) to adjust your eating plan to your individual calorie needs. This information is not intended to replace advice given to you by your health care provider. Make sure you discuss any questions you have with your  health care provider. Document Released: 08/19/2011 Document Revised: 08/23/2016 Document Reviewed: 08/23/2016 Elsevier Interactive Patient Education  [employer].

## 2018-02-07 DIAGNOSIS — H35363 Drusen (degenerative) of macula, bilateral: Secondary | ICD-10-CM | POA: Diagnosis not present

## 2018-02-07 DIAGNOSIS — H524 Presbyopia: Secondary | ICD-10-CM | POA: Diagnosis not present

## 2018-02-07 DIAGNOSIS — H52223 Regular astigmatism, bilateral: Secondary | ICD-10-CM | POA: Diagnosis not present

## 2018-02-07 DIAGNOSIS — H2513 Age-related nuclear cataract, bilateral: Secondary | ICD-10-CM | POA: Diagnosis not present

## 2018-02-07 DIAGNOSIS — H04123 Dry eye syndrome of bilateral lacrimal glands: Secondary | ICD-10-CM | POA: Diagnosis not present

## 2018-02-07 DIAGNOSIS — H10413 Chronic giant papillary conjunctivitis, bilateral: Secondary | ICD-10-CM | POA: Diagnosis not present

## 2018-03-24 ENCOUNTER — Encounter: Payer: Self-pay | Admitting: Family Medicine

## 2018-03-24 ENCOUNTER — Ambulatory Visit (INDEPENDENT_AMBULATORY_CARE_PROVIDER_SITE_OTHER): Payer: Medicare Other | Admitting: Family Medicine

## 2018-03-24 DIAGNOSIS — M545 Low back pain, unspecified: Secondary | ICD-10-CM

## 2018-03-24 MED ORDER — BACLOFEN 10 MG PO TABS: 10.0000 mg | ORAL_TABLET | Freq: Two times a day (BID) | ORAL | 0 refills | Status: DC

## 2018-03-24 MED ORDER — PREDNISONE 5 MG PO TABS: ORAL_TABLET | ORAL | 0 refills | Status: DC

## 2018-03-24 NOTE — Progress Notes (Signed)
Manuel Cruz - 73 y.o. male MRN [id number]  Date of birth: 1946/03/20  SUBJECTIVE:  Including CC & ROS.  Chief Complaint  Patient presents with  . Back Pain    Manuel Cruz is a 72 y.o. male that is presenting with back pain. Ongoing for one week. He was lifting up his friend off the floor when he felt a sharp pain. Located at his lower left back and radiates down into his buttock. No radicular symptoms. Denies tingling and numbness. Pain is constant. Denies certain movements that trigger the pain. He has been taking tylenol for the pain.   Previously had back surgery done in 2004 for a ruptured disc at L4 and L5.  Review of Systems  Constitutional: Negative for fever.  HENT: Negative for congestion.   Respiratory: Negative for cough.   Cardiovascular: Negative for chest pain.  Gastrointestinal: Negative for abdominal pain.  Musculoskeletal: Positive for back pain.  Skin: Negative for color change.  Neurological: Negative for weakness.  Hematological: Negative for adenopathy.  Psychiatric/Behavioral: Negative for agitation.    HISTORY: Past Medical, Surgical, Social, and Family History Reviewed & Updated per EMR.   Pertinent Historical Findings include:  Past Medical History:  Diagnosis Date  . Allergic rhinitis   . Allergy   . GERD (gastroesophageal reflux disease)   . Hyperlipidemia   . Sciatica of right side    recurrent  . Sleep apnea    pt denies  . Squamous cell skin cancer, earlobe   . Warts, genital     Past Surgical History:  Procedure Laterality Date  . COLONOSCOPY  08/12/2005  . [place] SURGERY  2004    No Known Allergies  Family History  Problem Relation Age of Onset  . Hyperlipidemia Father   . Hyperlipidemia Brother   . Cancer Mother        lung     Social History   Socioeconomic History  . Marital status: Single    Spouse name: Not on file  . Number of children: 0  . Years of education: Not on file  . Highest education level: Not  on file  Occupational History  . Occupation: Science writer  . Financial resource strain: Not hard at all  . Food insecurity:    Worry: Never true    Inability: Never true  . Transportation needs:    Medical: No    Non-medical: No  Tobacco Use  . Smoking status: Current Every Day Smoker    Packs/day: 0.25    Types: Cigarettes  . Smokeless tobacco: Never Used  Substance and Sexual Activity  . Alcohol use: Yes    Alcohol/week: 6.0 oz    Types: 10 Standard drinks or equivalent per week    Comment: wine  . Drug use: No  . Sexual activity: Yes  Lifestyle  . Physical activity:    Days per week: 3 days    Minutes per session: 60 min  . Stress: Not at all  Relationships  . Social connections:    Talks on phone: More than three times a week    Gets together: More than three times a week    Attends religious service: More than 4 times per year    Active member of club or organization: Yes    Attends meetings of clubs or organizations: 1 to 4 times per year    Relationship status: Not on file  . Intimate partner violence:    Fear of current or  ex partner: No    Emotionally abused: No    Physically abused: No    Forced sexual activity: No  Other Topics Concern  . Not on file  Social History Narrative  . Not on file     PHYSICAL EXAM:  VS: BP (!) 148/72 (BP Location: Left Arm, Patient Position: Sitting, Cuff Size: Normal)   Pulse 86   Resp (!) 98   Ht 5\' 8"  (1.727 m)   Wt 157 lb (71.2 kg)   BMI 23.87 kg/m  Physical Exam Gen: NAD, alert, cooperative with exam, well-appearing ENT: normal lips, normal nasal mucosa,  Eye: normal EOM, normal conjunctiva and lids CV:  no edema, +2 pedal pulses   Resp: no accessory muscle use, non-labored, Skin: no rashes, no areas of induration  Neuro: normal tone, normal sensation to touch Psych:  normal insight, alert and oriented MSK:  Back Exam:  Inspection: Unremarkable  Palpable tenderness: minimal tenderness in the  left lower back. No midline tenderness. No TTP of the GT or piriformis. . Range of Motion:  Flexion 45 deg; Extension 45 deg; Leg strength: Quad: 5/5 Hamstring: 5/5 Hip flexor  Strength at foot: Plantar-flexion: 5/5 Dorsi-flexion: 5/5 Eversion: 5/5 Inversion: 5/5  Sensory change: Gross sensation intact Gait unremarkable. SLR laying: Negative  Neurovascularly intact       ASSESSMENT & PLAN:   Low back pain Pain likely muscular in nature. Not suggestive of radiculopathy.  - prednisone and baclofen  - counseled on HEP  - counseled on supportive care - if no improvement consider PT, imaging or trigger point injections.

## 2018-03-24 NOTE — Patient Instructions (Signed)
Nice to meet you Please try the medications. The baclofen can make you sleepy so be careful taking this too often  Please try the exercises Please try heat on the area  Please follow up in 2-3 weeks if your pain hasn't improved.

## 2018-03-25 DIAGNOSIS — M545 Low back pain, unspecified: Secondary | ICD-10-CM | POA: Insufficient documentation

## 2018-03-25 NOTE — Assessment & Plan Note (Signed)
Pain likely muscular in nature. Not suggestive of radiculopathy.  - prednisone and baclofen  - counseled on HEP  - counseled on supportive care - if no improvement consider PT, imaging or trigger point injections.

## 2018-03-30 ENCOUNTER — Ambulatory Visit (INDEPENDENT_AMBULATORY_CARE_PROVIDER_SITE_OTHER): Payer: Medicare Other | Admitting: Family Medicine

## 2018-03-30 ENCOUNTER — Encounter: Payer: Self-pay | Admitting: Family Medicine

## 2018-03-30 DIAGNOSIS — M5442 Lumbago with sciatica, left side: Secondary | ICD-10-CM | POA: Diagnosis not present

## 2018-03-30 NOTE — Patient Instructions (Signed)
Good to see you  Please try increasing the gabapentin  Please let me know if your symptoms don't improve or get worse in the next week.

## 2018-03-30 NOTE — Progress Notes (Signed)
Manuel Cruz - 72 y.o. male MRN [id number]  Date of birth: [birth year]  SUBJECTIVE:  Including CC & ROS.  Chief Complaint  Patient presents with  . Follow-up    Manuel Cruz is a 72 y.o. male that is here today for back pain follow up. Pain has not improved, he states it feels like the pain has worsened. Pain is located his left lower back and and radiates down his left leg. Mild to severe.  He completed prednisone and is still taking Baclofen. He has not been doing the exercises due to the pain.     Review of Systems  Constitutional: Negative for fever.  HENT: Negative for congestion.   Respiratory: Negative for cough.   Cardiovascular: Negative for chest pain.  Gastrointestinal: Negative for abdominal pain.  Musculoskeletal: Positive for back pain and gait problem.  Skin: Negative for color change.  Neurological: Negative for weakness.  Hematological: Negative for adenopathy.  Psychiatric/Behavioral: Negative for agitation.    HISTORY: Past Medical, Surgical, Social, and Family History Reviewed & Updated per EMR.   Pertinent Historical Findings include:  Past Medical History:  Diagnosis Date  . Allergic rhinitis   . Allergy   . GERD (gastroesophageal reflux disease)   . Hyperlipidemia   . Sciatica of right side    recurrent  . Sleep apnea    pt denies  . Squamous cell skin cancer, earlobe   . Warts, genital     Past Surgical History:  Procedure Laterality Date  . COLONOSCOPY  08/12/2005  . [place] SURGERY  2004    No Known Allergies  Family History  Problem Relation Age of Onset  . Hyperlipidemia Father   . Hyperlipidemia Brother   . Cancer Mother        lung     Social History   Socioeconomic History  . Marital status: Single    Spouse name: Not on file  . Number of children: 0  . Years of education: Not on file  . Highest education level: Not on file  Occupational History  . Occupation: Science writer  . Financial resource  strain: Not hard at all  . Food insecurity:    Worry: Never true    Inability: Never true  . Transportation needs:    Medical: No    Non-medical: No  Tobacco Use  . Smoking status: Current Every Day Smoker    Packs/day: 0.25    Types: Cigarettes  . Smokeless tobacco: Never Used  Substance and Sexual Activity  . Alcohol use: Yes    Alcohol/week: 6.0 oz    Types: 10 Standard drinks or equivalent per week    Comment: wine  . Drug use: No  . Sexual activity: Yes  Lifestyle  . Physical activity:    Days per week: 3 days    Minutes per session: 60 min  . Stress: Not at all  Relationships  . Social connections:    Talks on phone: More than three times a week    Gets together: More than three times a week    Attends religious service: More than 4 times per year    Active member of club or organization: Yes    Attends meetings of clubs or organizations: 1 to 4 times per year    Relationship status: Not on file  . Intimate partner violence:    Fear of current or ex partner: No    Emotionally abused: No    Physically abused: No  Forced sexual activity: No  Other Topics Concern  . Not on file  Social History Narrative  . Not on file     PHYSICAL EXAM:  VS: BP 138/78 (BP Location: Left Arm, Patient Position: Sitting, Cuff Size: Normal)   Pulse 72   Ht 5\' 8"  (1.727 m)   BMI 23.87 kg/m  Physical Exam Gen: NAD, alert, cooperative with exam, well-appearing ENT: normal lips, normal nasal mucosa,  Eye: normal EOM, normal conjunctiva and lids CV:  no edema, +2 pedal pulses   Resp: no accessory muscle use, non-labored,  Skin: no rashes, no areas of induration  Neuro: normal tone, normal sensation to touch Psych:  normal insight, alert and oriented MSK:  Back:  TTP in the left lower back just above the iliac crest  Unable to stand up without pain  Normal strength to resistance with hip flexion, knee flexion and extension, plantarflexion and extension  Patellar DTR's on the  left are less than right  No TTP of the GT on left  Negative SLR b/l  Able to walk  Neurovascularly intact    Aspiration/Injection Procedure Note Manuel Cruz 05-Dec-1945  Procedure: Injection Indications: left lower back pain   Procedure Details Consent: Risks of procedure as well as the alternatives and risks of each were explained to the (patient/caregiver).  Consent for procedure obtained. Time Out: Verified patient identification, verified procedure, site/side was marked, verified correct patient position, special equipment/implants available, medications/allergies/relevent history reviewed, required imaging and test results available.  Performed.  The area was cleaned with iodine and alcohol swabs.    The left lower back trigger point was injected using 1 cc's of 40 mg Depomedrol and 4 cc's of 1% lidocaine with a 25 1" needle.   A sterile dressing was applied.  Patient did tolerate procedure well.       ASSESSMENT & PLAN:   Low back pain Pain is ongoing and with no improvement. No incontinence or sensory loss. DTR's of the left don't match the right.  - trigger point injections today  - increased gabapentin dose today  - counseled on HEP - given indications to seek immediate care and follow up

## 2018-04-01 NOTE — Assessment & Plan Note (Signed)
Pain is ongoing and with no improvement. No incontinence or sensory loss. DTR's of the left don't match the right.  - trigger point injections today  - increased gabapentin dose today  - counseled on HEP - given indications to seek immediate care and follow up

## 2018-04-03 ENCOUNTER — Telehealth: Payer: Self-pay | Admitting: *Deleted

## 2018-04-03 DIAGNOSIS — M5442 Lumbago with sciatica, left side: Secondary | ICD-10-CM

## 2018-04-03 NOTE — Telephone Encounter (Signed)
Radicular pain has been increasing despite conservative measures.  He does have a loss of his reflex on his patellar tendon on the left.  We will pursue an MRI to evaluate for herniation and nerve impingement.  May need to consider epidural injections due to the severity of his pain.  Rosemarie Ax, MD [facility] 04/03/2018, 5:00 PM

## 2018-04-03 NOTE — Telephone Encounter (Signed)
Pt saw Dr. Raeford Razor on 03/30/18 forwarding msg to him for his recommendation see msg below.../lmb Copied from [place] [id number]. Topic: General - Other >> Apr 03, 2018  9:13 AM Adelene Idler wrote: [name] called in for pt and states he isnt getting better he is still in pain , no reflux on left knee.  QQ#[phone] (Home) CB# [phone] (cell)  >> Apr 03, 2018  9:16 AM Morphies, Isidoro Donning wrote: Dr. Raeford Razor patient.

## 2018-04-04 ENCOUNTER — Telehealth (INDEPENDENT_AMBULATORY_CARE_PROVIDER_SITE_OTHER): Payer: Self-pay | Admitting: Orthopaedic Surgery

## 2018-04-04 ENCOUNTER — Telehealth: Payer: Self-pay | Admitting: Family Medicine

## 2018-04-04 ENCOUNTER — Ambulatory Visit (INDEPENDENT_AMBULATORY_CARE_PROVIDER_SITE_OTHER)
Admission: RE | Admit: 2018-04-04 | Discharge: 2018-04-04 | Disposition: A | Discharge status: Home / Self Care | Payer: Medicare Other | Source: Ambulatory Visit | Attending: Family Medicine | Admitting: Family Medicine

## 2018-04-04 DIAGNOSIS — M5442 Lumbago with sciatica, left side: Secondary | ICD-10-CM

## 2018-04-04 DIAGNOSIS — M545 Low back pain: Secondary | ICD-10-CM | POA: Diagnosis not present

## 2018-04-04 MED ORDER — CALCITONIN (SALMON) 200 UNIT/ACT NA SOLN: 1.0000 | Freq: Every day | NASAL | 1 refills | Status: DC

## 2018-04-04 MED ORDER — TRAMADOL HCL 50 MG PO TABS: 50.0000 mg | ORAL_TABLET | Freq: Three times a day (TID) | ORAL | 0 refills | Status: DC | PRN

## 2018-04-04 NOTE — Telephone Encounter (Signed)
Spoke with patient about xrays. Possible acute compression fracture at L3. Will send in tramadol and calcitonin. Advised to try and obtain old back films from his prior microdiscectomy. MRI still pending.   Rosemarie Ax, MD [facility] 04/04/2018, 11:04 AM

## 2018-04-04 NOTE — Telephone Encounter (Signed)
Patients wife called for patient, needing copy of the 06/21/2003 OP Note. I advised patient needed to sign authorization to release records. They will come by today for him to sign. I have the OP note printed

## 2018-04-05 ENCOUNTER — Ambulatory Visit
Admission: RE | Admit: 2018-04-05 | Discharge: 2018-04-05 | Disposition: A | Discharge status: Home / Self Care | Payer: Medicare Other | Source: Ambulatory Visit | Attending: Family Medicine | Admitting: Family Medicine

## 2018-04-05 ENCOUNTER — Telehealth: Payer: Self-pay | Admitting: Family Medicine

## 2018-04-05 DIAGNOSIS — M5442 Lumbago with sciatica, left side: Secondary | ICD-10-CM

## 2018-04-05 DIAGNOSIS — M48061 Spinal stenosis, lumbar region without neurogenic claudication: Secondary | ICD-10-CM | POA: Diagnosis not present

## 2018-04-05 DIAGNOSIS — S32030A Wedge compression fracture of third lumbar vertebra, initial encounter for closed fracture: Secondary | ICD-10-CM | POA: Insufficient documentation

## 2018-04-05 NOTE — Telephone Encounter (Signed)
Informed patient of MRI. Will obtain bone density to obtain baseline prior to possible treatment with Franco Nones, MD [facility] 04/05/2018, 1:33 PM

## 2018-04-06 ENCOUNTER — Ambulatory Visit (INDEPENDENT_AMBULATORY_CARE_PROVIDER_SITE_OTHER)
Admission: RE | Admit: 2018-04-06 | Discharge: 2018-04-06 | Disposition: A | Discharge status: Home / Self Care | Payer: Medicare Other | Source: Ambulatory Visit | Attending: Internal Medicine | Admitting: Internal Medicine

## 2018-04-06 DIAGNOSIS — S32030A Wedge compression fracture of third lumbar vertebra, initial encounter for closed fracture: Secondary | ICD-10-CM | POA: Diagnosis not present

## 2018-04-07 ENCOUNTER — Telehealth: Payer: Self-pay | Admitting: Internal Medicine

## 2018-04-07 NOTE — Telephone Encounter (Signed)
Copied from [place] [id number]. Topic: Quick Communication - Rx Refill/Question >> Apr 07, 2018  3:42 PM Bea Graff, NT wrote: Medication: baclofen (LIORESAL) 10 MG tablet   Has the patient contacted their pharmacy? Yes.   (Agent: If no, request that the patient contact the pharmacy for the refill.) (Agent: If yes, when and what did the pharmacy advise?)  Preferred Pharmacy (with phone number or street name): [id number] - [place], [place]. [address] [place] [phone] (Phone) [phone] (Fax      Agent: Please be advised that RX refills may take up to 3 business days. We ask that you follow-up with your pharmacy.

## 2018-04-10 ENCOUNTER — Telehealth: Payer: Self-pay | Admitting: Family Medicine

## 2018-04-10 MED ORDER — BACLOFEN 10 MG PO TABS: 10.0000 mg | ORAL_TABLET | Freq: Two times a day (BID) | ORAL | 0 refills | Status: DC

## 2018-04-10 NOTE — Telephone Encounter (Signed)
Spoke with patient this morning.   Rosemarie Ax, MD [facility] 04/10/2018, 9:28 AM

## 2018-04-10 NOTE — Telephone Encounter (Signed)
Med rx by Dr. Raeford Razor will forward to him for approval../lmb

## 2018-04-10 NOTE — Telephone Encounter (Signed)
Spoke with patient about his results. Will try prolia for low bone mass and compression fracture.   Rosemarie Ax, MD [facility] 04/10/2018, 9:22 AM

## 2018-04-20 ENCOUNTER — Telehealth: Payer: Self-pay

## 2018-04-20 NOTE — Telephone Encounter (Signed)
Appointment has been made with sport meds for this issues on 8/15.

## 2018-04-20 NOTE — Telephone Encounter (Signed)
Please schedule an OV for pt to discuss with PCP.  Copied from [place] [phone]. Topic: Inquiry >> Apr 20, 2018  9:47 AM Pricilla Handler wrote: Reason for CRM: Patient called wanting to talk with Dr. Jenny Reichmann and/or his assistant. Patient states that he would like them to call him concerning a compression fracture, and a different type of therapy and/or medication for it. Please call patient at [phone].       Thank You!!!

## 2018-04-27 ENCOUNTER — Ambulatory Visit: Payer: Medicare Other | Admitting: Family Medicine

## 2018-04-27 ENCOUNTER — Encounter: Payer: Self-pay | Admitting: Family Medicine

## 2018-04-27 ENCOUNTER — Ambulatory Visit (INDEPENDENT_AMBULATORY_CARE_PROVIDER_SITE_OTHER): Payer: Medicare Other | Admitting: Family Medicine

## 2018-04-27 VITALS — BP 116/68 | HR 68 | Ht 68.0 in | Wt 155.0 lb

## 2018-04-27 DIAGNOSIS — M8000XA Age-related osteoporosis with current pathological fracture, unspecified site, initial encounter for fracture: Secondary | ICD-10-CM | POA: Insufficient documentation

## 2018-04-27 DIAGNOSIS — M8000XD Age-related osteoporosis with current pathological fracture, unspecified site, subsequent encounter for fracture with routine healing: Secondary | ICD-10-CM | POA: Diagnosis not present

## 2018-04-27 DIAGNOSIS — S32030A Wedge compression fracture of third lumbar vertebra, initial encounter for closed fracture: Secondary | ICD-10-CM | POA: Diagnosis not present

## 2018-04-27 MED ORDER — TRAMADOL HCL 50 MG PO TABS: 100.0000 mg | ORAL_TABLET | Freq: Three times a day (TID) | ORAL | 0 refills | Status: DC | PRN

## 2018-04-27 NOTE — Patient Instructions (Addendum)
Good to see you  I have made a referral to neurosurgery. They will call for an appointment  Please take tylenol as well  Please let me know if the pain is still ongoing  The office will contact you about starting the prolia.

## 2018-04-27 NOTE — Progress Notes (Signed)
Manuel Cruz - 72 y.o. male MRN [id number]  Date of birth: 02/13/1946  SUBJECTIVE:  Including CC & ROS.  Chief Complaint  Patient presents with  . Follow-up    Manuel Cruz is a 72 y.o. male that is here today for compression fracture. Pain has not improved he uses a cane and walker to ambulate. Difficult to stand for long periods. He has been taking gabapentin daily. He has been using tramadol for the pain. He is able to stand for about 10 minutes at a time. He denies any change in bowel or bladder. Has some pain on the anterior aspect of his left leg.   Review of bone density from 7/25 is showing osteopenia   Review of the MRI lumbar spine from 7/24 shows acute/subactute inferior endplate compression fracture at L3-4.   Review of the lumbar spine xray from 7/23 shows posterior compression of the L3.   Review of Systems  Constitutional: Negative for fever.  HENT: Negative for congestion.   Respiratory: Negative for cough.   Cardiovascular: Negative for chest pain.  Gastrointestinal: Negative for abdominal pain.  Musculoskeletal: Positive for back pain and gait problem.  Skin: Negative for color change.  Neurological: Negative for weakness.  Hematological: Negative for adenopathy.  Psychiatric/Behavioral: Negative for agitation.    HISTORY: Past Medical, Surgical, Social, and Family History Reviewed & Updated per EMR.   Pertinent Historical Findings include:  Past Medical History:  Diagnosis Date  . Allergic rhinitis   . Allergy   . GERD (gastroesophageal reflux disease)   . Hyperlipidemia   . Sciatica of right side    recurrent  . Sleep apnea    pt denies  . Squamous cell skin cancer, earlobe   . Warts, genital     Past Surgical History:  Procedure Laterality Date  . COLONOSCOPY  08/12/2005  . [place] SURGERY  2004    No Known Allergies  Family History  Problem Relation Age of Onset  . Hyperlipidemia Father   . Hyperlipidemia Brother   . Cancer Mother         lung     Social History   Socioeconomic History  . Marital status: Single    Spouse name: Not on file  . Number of children: 0  . Years of education: Not on file  . Highest education level: Not on file  Occupational History  . Occupation: Science writer  . Financial resource strain: Not hard at all  . Food insecurity:    Worry: Never true    Inability: Never true  . Transportation needs:    Medical: No    Non-medical: No  Tobacco Use  . Smoking status: Current Every Day Smoker    Packs/day: 0.25    Types: Cigarettes  . Smokeless tobacco: Never Used  Substance and Sexual Activity  . Alcohol use: Yes    Alcohol/week: 10.0 standard drinks    Types: 10 Standard drinks or equivalent per week    Comment: wine  . Drug use: No  . Sexual activity: Yes  Lifestyle  . Physical activity:    Days per week: 3 days    Minutes per session: 60 min  . Stress: Not at all  Relationships  . Social connections:    Talks on phone: More than three times a week    Gets together: More than three times a week    Attends religious service: More than 4 times per year    Active member  of club or organization: Yes    Attends meetings of clubs or organizations: 1 to 4 times per year    Relationship status: Not on file  . Intimate partner violence:    Fear of current or ex partner: No    Emotionally abused: No    Physically abused: No    Forced sexual activity: No  Other Topics Concern  . Not on file  Social History Narrative  . Not on file     PHYSICAL EXAM:  VS: BP 116/68 (BP Location: Left Arm, Patient Position: Sitting, Cuff Size: Normal)   Pulse 68   Ht 5\' 8"  (1.727 m)   Wt 155 lb (70.3 kg)   SpO2 98%   BMI 23.57 kg/m  Physical Exam Gen: NAD, alert, cooperative with exam, well-appearing ENT: normal lips, normal nasal mucosa,  Eye: normal EOM, normal conjunctiva and lids CV:  no edema, +2 pedal pulses   Resp: no accessory muscle use, non-labored,  Skin: no  rashes, no areas of induration  Neuro: normal tone, normal sensation to touch Psych:  normal insight, alert and oriented MSK:  Left leg:  Sitting in wheelchair  Able to stand on his own but with pain.  Negative SLR b/l  DTR on left is diminished compared to right  Neurovascularly intact       ASSESSMENT & PLAN:   Osteoporosis with pathological fracture Will start prolia.   Compression fracture of L3 lumbar vertebra, closed, initial encounter ([place]) Pain is ongoing. Doesn't seem to be controlled despite medications thus far. Will start prolia soon  - tramadol  - referral to neurosurgery  - may need to initiate PT

## 2018-04-30 NOTE — Assessment & Plan Note (Signed)
Will start prolia.

## 2018-04-30 NOTE — Assessment & Plan Note (Signed)
Pain is ongoing. Doesn't seem to be controlled despite medications thus far. Will start prolia soon  - tramadol  - referral to neurosurgery  - may need to initiate PT

## 2018-05-02 ENCOUNTER — Telehealth: Payer: Self-pay

## 2018-05-02 NOTE — Telephone Encounter (Signed)
Regina from [facility] called about recent referral they received on pt. She needed a copy of insurance, injection given by Dr. Raeford Razor and surgeon name of 2004 back surgery. She requested info be faxed to [phone] sent info to her.

## 2018-05-03 ENCOUNTER — Telehealth: Payer: Self-pay | Admitting: Internal Medicine

## 2018-05-03 ENCOUNTER — Telehealth: Payer: Self-pay | Admitting: Family Medicine

## 2018-05-03 ENCOUNTER — Other Ambulatory Visit (INDEPENDENT_AMBULATORY_CARE_PROVIDER_SITE_OTHER): Payer: Medicare Other

## 2018-05-03 ENCOUNTER — Ambulatory Visit (INDEPENDENT_AMBULATORY_CARE_PROVIDER_SITE_OTHER): Payer: Medicare Other | Admitting: Internal Medicine

## 2018-05-03 ENCOUNTER — Encounter: Payer: Self-pay | Admitting: Internal Medicine

## 2018-05-03 VITALS — BP 128/82 | HR 91 | Temp 98.3°F | Ht 68.0 in | Wt 142.0 lb

## 2018-05-03 DIAGNOSIS — N320 Bladder-neck obstruction: Secondary | ICD-10-CM | POA: Diagnosis not present

## 2018-05-03 DIAGNOSIS — F329 Major depressive disorder, single episode, unspecified: Secondary | ICD-10-CM | POA: Diagnosis not present

## 2018-05-03 DIAGNOSIS — M8000XD Age-related osteoporosis with current pathological fracture, unspecified site, subsequent encounter for fracture with routine healing: Secondary | ICD-10-CM | POA: Diagnosis not present

## 2018-05-03 DIAGNOSIS — S32030A Wedge compression fracture of third lumbar vertebra, initial encounter for closed fracture: Secondary | ICD-10-CM | POA: Diagnosis not present

## 2018-05-03 DIAGNOSIS — E785 Hyperlipidemia, unspecified: Secondary | ICD-10-CM

## 2018-05-03 DIAGNOSIS — R739 Hyperglycemia, unspecified: Secondary | ICD-10-CM

## 2018-05-03 DIAGNOSIS — F32A Depression, unspecified: Secondary | ICD-10-CM

## 2018-05-03 LAB — HEPATIC FUNCTION PANEL
ALK PHOS: 103 U/L (ref 39–117)
ALT: 40 U/L (ref 0–53)
AST: 31 U/L (ref 0–37)
Albumin: 4.4 g/dL (ref 3.5–5.2)
BILIRUBIN DIRECT: 0.2 mg/dL (ref 0.0–0.3)
TOTAL PROTEIN: 7.2 g/dL (ref 6.0–8.3)
Total Bilirubin: 1 mg/dL (ref 0.2–1.2)

## 2018-05-03 LAB — BASIC METABOLIC PANEL
BUN: 12 mg/dL (ref 6–23)
CHLORIDE: 102 meq/L (ref 96–112)
CO2: 26 mEq/L (ref 19–32)
Calcium: 10.1 mg/dL (ref 8.4–10.5)
Creatinine, Ser: 0.81 mg/dL (ref 0.40–1.50)
GFR: 99.91 mL/min (ref >60.00)
Glucose, Bld: 97 mg/dL (ref 70–99)
Potassium: 4.6 mEq/L (ref 3.5–5.1)
Sodium: 138 mEq/L (ref 135–145)

## 2018-05-03 LAB — CBC WITH DIFFERENTIAL/PLATELET
BASOS ABS: 0.1 10*3/uL (ref 0.0–0.1)
BASOS PCT: 1.2 % (ref 0.0–3.0)
EOS ABS: 0.2 10*3/uL (ref 0.0–0.7)
Eosinophils Relative: 2.9 % (ref 0.0–5.0)
HEMATOCRIT: 46.8 % (ref 39.0–52.0)
Hemoglobin: 15.8 g/dL (ref 13.0–17.0)
LYMPHS ABS: 2 10*3/uL (ref 0.7–4.0)
LYMPHS PCT: 25.7 % (ref 12.0–46.0)
MCHC: 33.9 g/dL (ref 30.0–36.0)
MCV: 102 fl — ABNORMAL HIGH (ref 78.0–100.0)
MONO ABS: 1 10*3/uL (ref 0.1–1.0)
Monocytes Relative: 13.6 % — ABNORMAL HIGH (ref 3.0–12.0)
NEUTROS ABS: 4.4 10*3/uL (ref 1.4–7.7)
NEUTROS PCT: 56.6 % (ref 43.0–77.0)
PLATELETS: 325 10*3/uL (ref 150.0–400.0)
RBC: 4.59 Mil/uL (ref 4.22–5.81)
RDW: 13.4 % (ref 11.5–15.5)
WBC: 7.7 10*3/uL (ref 4.0–10.5)

## 2018-05-03 LAB — URINALYSIS, ROUTINE W REFLEX MICROSCOPIC
Bilirubin Urine: NEGATIVE
Hgb urine dipstick: NEGATIVE
Leukocytes, UA: NEGATIVE
Nitrite: NEGATIVE
PH: 6 (ref 5.0–8.0)
RBC / HPF: NONE SEEN (ref 0–?)
SPECIFIC GRAVITY, URINE: 1.02 (ref 1.000–1.030)
Total Protein, Urine: NEGATIVE
Urine Glucose: NEGATIVE
Urobilinogen, UA: 0.2 (ref 0.0–1.0)

## 2018-05-03 LAB — LIPID PANEL
CHOL/HDL RATIO: 3
CHOLESTEROL: 186 mg/dL (ref 0–200)
HDL: 61.1 mg/dL (ref >39.00)
NonHDL: 124.9
TRIGLYCERIDES: 248 mg/dL — AB (ref 0.0–149.0)
VLDL: 49.6 mg/dL — AB (ref 0.0–40.0)

## 2018-05-03 LAB — TSH: TSH: 2.63 u[IU]/mL (ref 0.35–4.50)

## 2018-05-03 LAB — PSA: PSA: 1.65 ng/mL (ref 0.10–4.00)

## 2018-05-03 LAB — HEMOGLOBIN A1C: Hgb A1c MFr Bld: 5.9 % (ref 4.6–6.5)

## 2018-05-03 LAB — LDL CHOLESTEROL, DIRECT: LDL DIRECT: 93 mg/dL

## 2018-05-03 MED ORDER — OMEPRAZOLE 20 MG PO CPDR: 20.0000 mg | DELAYED_RELEASE_CAPSULE | Freq: Every day | ORAL | 3 refills | Status: DC

## 2018-05-03 MED ORDER — ATORVASTATIN CALCIUM 80 MG PO TABS: 40.0000 mg | ORAL_TABLET | Freq: Every day | ORAL | 3 refills | Status: DC

## 2018-05-03 MED ORDER — HYDROCODONE-ACETAMINOPHEN 5-325 MG PO TABS: 1.0000 | ORAL_TABLET | Freq: Four times a day (QID) | ORAL | 0 refills | Status: DC | PRN

## 2018-05-03 MED ORDER — CALCITONIN (SALMON) 200 UNIT/ACT NA SOLN: 1.0000 | Freq: Every day | NASAL | 11 refills | Status: DC

## 2018-05-03 MED ORDER — GABAPENTIN 300 MG PO CAPS: ORAL_CAPSULE | ORAL | 1 refills | Status: DC

## 2018-05-03 NOTE — Telephone Encounter (Signed)
States during the last appt with Manuel Cruz a form for insurance to cancel a trip was dropped off to be completed.  Please call patient back in regard.

## 2018-05-03 NOTE — Assessment & Plan Note (Signed)
Will need f/u with NS, pain control, and check PTH

## 2018-05-03 NOTE — Assessment & Plan Note (Signed)
stable overall by history and exam, recent data reviewed with pt, and pt to continue medical treatment as before,  to f/u any worsening symptoms or concerns  

## 2018-05-03 NOTE — Assessment & Plan Note (Signed)
stable overall by history and exam, recent data reviewed with pt, and pt to continue medical treatment as before,  to f/u any worsening symptoms or concerns, for f/u lab today 

## 2018-05-03 NOTE — Patient Instructions (Signed)
Please take all new medication as prescribed  - the hydrocodone  (remember by law we can only give one week to start)  You are given the parking application signed today  Please continue all other medications as before, and refills have been done if requested.  Please have the pharmacy call with any other refills you may need.  Please continue your efforts at being more active, low cholesterol diet, and weight control.  You are otherwise up to date with prevention measures today.  Please keep your appointments with your specialists as you may have planned - neurosurgury  Please go to the LAB in the Basement (turn left off the elevator) for the tests to be done today  You will be contacted by phone if any changes need to be made immediately.  Otherwise, you will receive a letter about your results with an explanation, but please check with MyChart first.  Please remember to sign up for MyChart if you have not done so, as this will be important to you in the future with finding out test results, communicating by private email, and scheduling acute appointments online when needed.  Please return in 6 months, or sooner if needed

## 2018-05-03 NOTE — Progress Notes (Signed)
Subjective:    Patient ID: Manuel Cruz, male    DOB: [birth year], 72 y.o.   MRN: [id number]  HPI  Here for yearly f/u;  Overall doing ok;  Pt denies Chest pain, worsening SOB, DOE, wheezing, orthopnea, PND, worsening LE edema, palpitations, dizziness or syncope.  Pt denies neurological change such as new headache, facial or extremity weakness.  Pt denies polydipsia, polyuria, or low sugar symptoms. Pt states overall good compliance with treatment and medications, good tolerability, and has been trying to follow appropriate diet.  Pt denies worsening depressive symptoms, suicidal ideation or panic. No fever, night sweats, wt loss, loss of appetite, or other constitutional symptoms.  Pt states good ability with ADL's, has low fall risk, home safety reviewed and adequate, no other significant changes in hearing or vision, and only occasionally active with exercise.  Did have tramadol recently for l3 compression fx, being referred hopefully soon to local NS.  Still smoking, trying to quit but not much headway.  Has recurring pain to the LLE c/w sciatica, now on higher dose gabapentin, needs refill.  Ultram not helping Past Medical History:  Diagnosis Date  . Allergic rhinitis   . Allergy   . GERD (gastroesophageal reflux disease)   . Hyperlipidemia   . Sciatica of right side    recurrent  . Sleep apnea    pt denies  . Squamous cell skin cancer, earlobe   . Warts, genital    Past Surgical History:  Procedure Laterality Date  . COLONOSCOPY  08/12/2005  . [place] SURGERY  2004    reports that he has been smoking cigarettes. He has been smoking about 0.25 packs per day. He has never used smokeless tobacco. He reports that he drinks about 10.0 standard drinks of alcohol per week. He reports that he does not use drugs. family history includes Cancer in his mother; Hyperlipidemia in his brother and father. No Known Allergies Current Outpatient Medications on File Prior to Visit  Medication  Sig Dispense Refill  . aspirin 81 MG EC tablet Take 1 tablet (81 mg total) by mouth daily. 90 tablet 1  . baclofen (LIORESAL) 10 MG tablet Take 1 tablet (10 mg total) by mouth 2 (two) times daily. 30 each 0  . Carboxymeth-Glycerin-Polysorb (REFRESH OPTIVE ADVANCED OP) Apply 1 drop to eye 2 (two) times daily.    . Cetirizine HCl (ZYRTEC ALLERGY) 10 MG CAPS Take 1 capsule (10 mg total) by mouth as needed. 90 capsule 3  . ketotifen (ZADITOR) 0.025 % ophthalmic solution Place 1 drop into both eyes 2 (two) times daily.    . Melatonin 3 MG TABS Take by mouth.    . Multiple Vitamin (MULTIVITAMIN) tablet Take 1 tablet by mouth daily.    . naproxen (NAPROSYN) 250 MG tablet Take by mouth 2 (two) times daily with a meal.    . Probiotic Product (PROBIOTIC PO) Take by mouth.    . traMADol (ULTRAM) 50 MG tablet Take 2 tablets (100 mg total) by mouth every 8 (eight) hours as needed. 45 tablet 0   No current facility-administered medications on file prior to visit.    Review of Systems  Constitutional: Negative for other unusual diaphoresis or sweats HENT: Negative for ear discharge or swelling Eyes: Negative for other worsening visual disturbances Respiratory: Negative for stridor or other swelling  Gastrointestinal: Negative for worsening distension or other blood Genitourinary: Negative for retention or other urinary change Musculoskeletal: Negative for other MSK pain or swelling Skin: Negative  for color change or other new lesions Neurological: Negative for worsening tremors and other numbness  Psychiatric/Behavioral: Negative for worsening agitation or other fatigue All other system neg per pt    Objective:   Physical Exam BP 128/82   Pulse 91   Temp 98.3 F (36.8 C) (Oral)   Ht 5\' 8"  (1.727 m)   Wt 142 lb (64.4 kg)   SpO2 97%   BMI 21.59 kg/m  VS noted,  Constitutional: Pt appears in NAD HENT: Head: NCAT.  Right Ear: External ear normal.  Left Ear: External ear normal.  Eyes: .  Pupils are equal, round, and reactive to light. Conjunctivae and EOM are normal Nose: without d/c or deformity Neck: Neck supple. Gross normal ROM Cardiovascular: Normal rate and regular rhythm.   Pulmonary/Chest: Effort normal and breath sounds without rales or wheezing.  Abd:  Soft, NT, ND, + BS, no organomegaly Neurological: Pt is alert. At baseline orientation, motor grossly intact Skin: Skin is warm. No rashes, other new lesions, no LE edema Psychiatric: Pt behavior is normal without agitation  No other exam findings Lab Results  Component Value Date   WBC 7.9 04/26/2017   HGB 15.1 04/26/2017   HCT 44.4 04/26/2017   PLT 326.0 04/26/2017   GLUCOSE 130 (H) 04/26/2017   CHOL 166 04/26/2017   TRIG (H) 04/26/2017    463.0 Triglyceride is over 400; calculations on Lipids are invalid.   HDL 61.30 04/26/2017   LDLDIRECT 63.0 04/26/2017   LDLCALC 82 02/29/2012   ALT 78 (H) 04/26/2017   AST 70 (H) 04/26/2017   NA 136 04/26/2017   K 4.8 04/26/2017   CL 101 04/26/2017   CREATININE 0.76 04/26/2017   BUN 14 04/26/2017   CO2 28 04/26/2017   TSH 1.96 04/26/2017   PSA 1.42 04/26/2017   HGBA1C 5.7 04/26/2017       Assessment & Plan:

## 2018-05-03 NOTE — Telephone Encounter (Signed)
Patient is here today for an appt with Dr. Jenny Reichmann.  States prolia had to be resubmitted with insurance.  Would like to get more information on this.

## 2018-05-03 NOTE — Assessment & Plan Note (Signed)
F/u NS for hopeful kyphoplasty

## 2018-05-03 NOTE — Assessment & Plan Note (Signed)
stable overall by history and exam, recent data reviewed with pt, and pt to continue medical treatment as before,  to f/u any worsening symptoms or concerns Lab Results  Component Value Date   HGBA1C 5.7 04/26/2017

## 2018-05-03 NOTE — Telephone Encounter (Signed)
Patient's questions about prolia were answered in office visit with dr Jenny Reichmann by Jonelle Sidle, we are verifying insurance for prolia and will call patient back to discuss summary of benefits

## 2018-05-03 NOTE — Telephone Encounter (Signed)
Patient picked up form from [place] location.

## 2018-05-05 LAB — PTH, INTACT AND CALCIUM
Calcium: 9.8 mg/dL (ref 8.6–10.3)
PTH: 43 pg/mL (ref 14–64)

## 2018-05-09 ENCOUNTER — Telehealth: Payer: Self-pay | Admitting: Internal Medicine

## 2018-05-09 NOTE — Telephone Encounter (Signed)
Called patient to schedule appointment for Prolia injection. Insurance was submitted and verified. Patient would be responsible for a $0 copay. He would like to hold off for now and will call back if he would like to proceed.

## 2018-05-14 HISTORY — PX: LUMBAR DISC SURGERY: SHX700

## 2018-05-17 ENCOUNTER — Telehealth: Payer: Self-pay | Admitting: Internal Medicine

## 2018-05-17 MED ORDER — TRAMADOL HCL 50 MG PO TABS: 50.0000 mg | ORAL_TABLET | Freq: Four times a day (QID) | ORAL | 0 refills | Status: AC | PRN

## 2018-05-17 NOTE — Telephone Encounter (Signed)
Called pt regarding his refill request for tramadol. The tramadol was discontinued on 05/03/18. Pt stated that the tramadol is working better than the hydrocodone-acetaminophen. He has about 20 pills left of the tramadol and wanted to make sure he had enough to last until his visit with neuro on the 11 th. He has been taking them sparingly, instead of the 2 every 8 hours, he has been taking 1. He is asking for a call back from Dr. Jenny Reichmann regarding the tramadol.

## 2018-05-17 NOTE — Telephone Encounter (Signed)
Copied from [place] [id number]. Topic: Quick Communication - Rx Refill/Question >> May 17, 2018  1:57 PM Selinda Flavin B, NT wrote: Medication: traMADol (ULTRAM) 50 MG tablet   Has the patient contacted their pharmacy? Yes.   (Agent: If no, request that the patient contact the pharmacy for the refill.) (Agent: If yes, when and what did the pharmacy advise?)  Preferred Pharmacy (with phone number or street name): [id number] [place], [address]. [facility] [address]  Agent: Please be advised that RX refills may take up to 3 business days. We ask that you follow-up with your pharmacy.

## 2018-05-17 NOTE — Telephone Encounter (Signed)
Done erx 

## 2018-05-17 NOTE — Addendum Note (Signed)
Addended by: Biagio Borg on: 05/17/2018 04:44 PM   Modules accepted: Orders

## 2018-05-24 ENCOUNTER — Telehealth: Payer: Self-pay | Admitting: Family Medicine

## 2018-05-24 DIAGNOSIS — M5416 Radiculopathy, lumbar region: Secondary | ICD-10-CM | POA: Diagnosis not present

## 2018-05-24 NOTE — Telephone Encounter (Signed)
Insurance has been submitted and verified for Prolia. Patient is responsible for a $0 copay. Due anytime. Called patient and spoke to him and his wife. Patient will be having a lumbar laminectomy by Dr Trenton Gammon and was advised to get Prolia after the procedure. Patient will call back to schedule when they are ready.   Okay to schedule... Visit Note: Prolia ($0 copay - okay to give per Gareth Eagle) Visit Type: Nurse Provider: Nurse

## 2018-05-26 DIAGNOSIS — M5416 Radiculopathy, lumbar region: Secondary | ICD-10-CM | POA: Diagnosis not present

## 2018-05-26 DIAGNOSIS — M48062 Spinal stenosis, lumbar region with neurogenic claudication: Secondary | ICD-10-CM | POA: Diagnosis not present

## 2018-06-08 DIAGNOSIS — Z23 Encounter for immunization: Secondary | ICD-10-CM | POA: Diagnosis not present

## 2018-06-17 ENCOUNTER — Other Ambulatory Visit: Payer: Self-pay | Admitting: Internal Medicine

## 2018-06-22 ENCOUNTER — Telehealth: Payer: Self-pay | Admitting: Internal Medicine

## 2018-06-22 NOTE — Telephone Encounter (Signed)
Copied from [place] [id number]. Topic: Quick Communication - Rx Refill/Question >> Jun 22, 2018  1:23 PM Margot Ables wrote: Medication: atorvastatin (LIPITOR) 80 MG tablet - RX was for 1 tablet daily - the pt has received the same medication on 05/30/18 for 1/2 tablet daily - please call to clarify correct dose Ref # [id number]

## 2018-06-22 NOTE — Telephone Encounter (Signed)
[place] for 1 whole pill per day

## 2018-06-22 NOTE — Telephone Encounter (Signed)
Clarification has been given to [facility].

## 2018-10-19 ENCOUNTER — Other Ambulatory Visit: Payer: Self-pay | Admitting: Internal Medicine

## 2018-11-01 DIAGNOSIS — M5416 Radiculopathy, lumbar region: Secondary | ICD-10-CM | POA: Diagnosis not present

## 2018-11-08 ENCOUNTER — Ambulatory Visit (INDEPENDENT_AMBULATORY_CARE_PROVIDER_SITE_OTHER): Payer: Medicare Other | Admitting: Internal Medicine

## 2018-11-08 ENCOUNTER — Other Ambulatory Visit (INDEPENDENT_AMBULATORY_CARE_PROVIDER_SITE_OTHER): Payer: Medicare Other

## 2018-11-08 ENCOUNTER — Encounter: Payer: Self-pay | Admitting: Internal Medicine

## 2018-11-08 VITALS — BP 118/70 | HR 91 | Temp 98.0°F | Ht 68.0 in | Wt 149.0 lb

## 2018-11-08 DIAGNOSIS — F32A Depression, unspecified: Secondary | ICD-10-CM

## 2018-11-08 DIAGNOSIS — I2583 Coronary atherosclerosis due to lipid rich plaque: Secondary | ICD-10-CM | POA: Diagnosis not present

## 2018-11-08 DIAGNOSIS — F329 Major depressive disorder, single episode, unspecified: Secondary | ICD-10-CM | POA: Diagnosis not present

## 2018-11-08 DIAGNOSIS — Z23 Encounter for immunization: Secondary | ICD-10-CM

## 2018-11-08 DIAGNOSIS — I2510 Atherosclerotic heart disease of native coronary artery without angina pectoris: Secondary | ICD-10-CM

## 2018-11-08 DIAGNOSIS — E785 Hyperlipidemia, unspecified: Secondary | ICD-10-CM

## 2018-11-08 DIAGNOSIS — R739 Hyperglycemia, unspecified: Secondary | ICD-10-CM

## 2018-11-08 LAB — BASIC METABOLIC PANEL
BUN: 13 mg/dL (ref 6–23)
CO2: 28 meq/L (ref 19–32)
Calcium: 10.6 mg/dL — ABNORMAL HIGH (ref 8.4–10.5)
Chloride: 99 mEq/L (ref 96–112)
Creatinine, Ser: 0.84 mg/dL (ref 0.40–1.50)
GFR: 90.01 mL/min (ref >60.00)
Glucose, Bld: 108 mg/dL — ABNORMAL HIGH (ref 70–99)
Potassium: 4.5 mEq/L (ref 3.5–5.1)
Sodium: 137 mEq/L (ref 135–145)

## 2018-11-08 LAB — HEPATIC FUNCTION PANEL
ALT: 35 U/L (ref 0–53)
AST: 31 U/L (ref 0–37)
Albumin: 3.8 g/dL (ref 3.5–5.2)
Alkaline Phosphatase: 91 U/L (ref 39–117)
Bilirubin, Direct: 0.1 mg/dL (ref 0.0–0.3)
Total Bilirubin: 0.6 mg/dL (ref 0.2–1.2)
Total Protein: 7.6 g/dL (ref 6.0–8.3)

## 2018-11-08 LAB — LIPID PANEL
Cholesterol: 163 mg/dL (ref 0–200)
HDL: 60.9 mg/dL (ref >39.00)
LDL Cholesterol: 80 mg/dL (ref 0–99)
NonHDL: 101.92
Total CHOL/HDL Ratio: 3
Triglycerides: 109 mg/dL (ref 0.0–149.0)
VLDL: 21.8 mg/dL (ref 0.0–40.0)

## 2018-11-08 LAB — HEMOGLOBIN A1C: Hgb A1c MFr Bld: 6 % (ref 4.6–6.5)

## 2018-11-08 NOTE — Assessment & Plan Note (Signed)
stable overall by history and exam, recent data reviewed with pt, and pt to continue medical treatment as before,  to f/u any worsening symptoms or concerns  

## 2018-11-08 NOTE — Progress Notes (Signed)
Subjective:    Patient ID: Manuel Cruz, male    DOB: 06/12/1946, 73 y.o.   MRN: [id number]  HPI  Here to f/u; overall doing ok,  Pt denies chest pain, increasing sob or doe, wheezing, orthopnea, PND, increased LE swelling, palpitations, dizziness or syncope.  Pt denies new neurological symptoms such as new headache, or facial or extremity weakness or numbness.  Pt denies polydipsia, polyuria, or low sugar episode.  Pt states overall good compliance with meds, mostly trying to follow appropriate diet, with wt overall stable,  but little exercise however. S/p lumbar surgury for LLE radiculopathy per Dr Trenton Gammon in Sept 2019, still with some ambulatory gait disorder, walks with cane, has had several falls since surgury, no injury.  Seen last wk per NS, and now for repeat MRI LS spine.  May need cortisone or PT.  Had URi symptoms last wk, now resolved except for persistent non prod xray.   Denies worsening depressive symptoms, suicidal ideation, or panic Past Medical History:  Diagnosis Date  . Allergic rhinitis   . Allergy   . GERD (gastroesophageal reflux disease)   . Hyperlipidemia   . Sciatica of right side    recurrent  . Sleep apnea    pt denies  . Squamous cell skin cancer, earlobe   . Warts, genital    Past Surgical History:  Procedure Laterality Date  . COLONOSCOPY  08/12/2005  . [place] SURGERY  2004    reports that he has been smoking cigarettes. He has been smoking about 0.25 packs per day. He has never used smokeless tobacco. He reports current alcohol use of about 10.0 standard drinks of alcohol per week. He reports that he does not use drugs. family history includes Cancer in his mother; Hyperlipidemia in his brother and father. No Known Allergies Current Outpatient Medications on File Prior to Visit  Medication Sig Dispense Refill  . aspirin 81 MG EC tablet Take 1 tablet (81 mg total) by mouth daily. 90 tablet 1  . atorvastatin (LIPITOR) 80 MG tablet TAKE 1 TABLET DAILY  90 tablet 3  . calcitonin, salmon, (MIACALCIN) 200 UNIT/ACT nasal spray Place 1 spray into alternate nostrils daily. 3.7 mL 11  . Carboxymeth-Glycerin-Polysorb (REFRESH OPTIVE ADVANCED OP) Apply 1 drop to eye 2 (two) times daily.    . Cetirizine HCl (ZYRTEC ALLERGY) 10 MG CAPS Take 1 capsule (10 mg total) by mouth as needed. 90 capsule 3  . gabapentin (NEURONTIN) 300 MG capsule TAKE 2 CAPSULES 3 TIMES A  DAY 540 capsule 1  . ketotifen (ZADITOR) 0.025 % ophthalmic solution Place 1 drop into both eyes 2 (two) times daily.    . Melatonin 3 MG TABS Take by mouth.    . Multiple Vitamin (MULTIVITAMIN) tablet Take 1 tablet by mouth daily.    . naproxen (NAPROSYN) 250 MG tablet Take by mouth 2 (two) times daily with a meal.    . omeprazole (PRILOSEC) 20 MG capsule Take 1 capsule (20 mg total) by mouth daily. 90 capsule 3  . Probiotic Product (PROBIOTIC PO) Take by mouth.    . traMADol (ULTRAM) 50 MG tablet Take by mouth every 6 (six) hours as needed.     No current facility-administered medications on file prior to visit.    Review of Systems  Constitutional: Negative for other unusual diaphoresis or sweats HENT: Negative for ear discharge or swelling Eyes: Negative for other worsening visual disturbances Respiratory: Negative for stridor or other swelling  Gastrointestinal: Negative for  worsening distension or other blood Genitourinary: Negative for retention or other urinary change Musculoskeletal: Negative for other MSK pain or swelling Skin: Negative for color change or other new lesions Neurological: Negative for worsening tremors and other numbness  Psychiatric/Behavioral: Negative for worsening agitation or other fatigue All other system neg per pt    Objective:   Physical Exam BP 118/70   Pulse 91   Temp 98 F (36.7 C) (Oral)   Ht 5\' 8"  (1.727 m)   Wt 149 lb (67.6 kg)   SpO2 93%   BMI 22.66 kg/m  VS noted,  Constitutional: Pt appears in NAD HENT: Head: NCAT.  Right Ear:  External ear normal.  Left Ear: External ear normal.  Eyes: . Pupils are equal, round, and reactive to light. Conjunctivae and EOM are normal Nose: without d/c or deformity Neck: Neck supple. Gross normal ROM Cardiovascular: Normal rate and regular rhythm.   Pulmonary/Chest: Effort normal and breath sounds without rales or wheezing.  Abd:  Soft, NT, ND, + BS, no organomegaly Neurological: Pt is alert. At baseline orientation, motor grossly intact Skin: Skin is warm. No rashes, other new lesions, no LE edema Psychiatric: Pt behavior is normal without agitation , not depressed affect No other exam findings Lab Results  Component Value Date   WBC 7.7 05/03/2018   HGB 15.8 05/03/2018   HCT 46.8 05/03/2018   PLT 325.0 05/03/2018   GLUCOSE 108 (H) 11/08/2018   CHOL 163 11/08/2018   TRIG 109.0 11/08/2018   HDL 60.90 11/08/2018   LDLDIRECT 93.0 05/03/2018   LDLCALC 80 11/08/2018   ALT 35 11/08/2018   AST 31 11/08/2018   NA 137 11/08/2018   K 4.5 11/08/2018   CL 99 11/08/2018   CREATININE 0.84 11/08/2018   BUN 13 11/08/2018   CO2 28 11/08/2018   TSH 2.63 05/03/2018   PSA 1.65 05/03/2018   HGBA1C 6.0 11/08/2018      Assessment & Plan:

## 2018-11-08 NOTE — Patient Instructions (Addendum)
You had the Tdap tetanus shot today  You will be contacted regarding the referral for: cardiac CT score  Please continue all other medications as before, and refills have been done if requested.  Please have the pharmacy call with any other refills you may need.  Please continue your efforts at being more active, low cholesterol diet, and weight control.  You are otherwise up to date with prevention measures today.  Please keep your appointments with your specialists as you may have planned  Please go to the LAB in the Basement (turn left off the elevator) for the tests to be done today  You will be contacted by phone if any changes need to be made immediately.  Otherwise, you will receive a letter about your results with an explanation, but please check with MyChart first.  Please remember to sign up for MyChart if you have not done so, as this will be important to you in the future with finding out test results, communicating by private email, and scheduling acute appointments online when needed.  Please return in 6 months, or sooner if needed

## 2018-11-14 DIAGNOSIS — M47816 Spondylosis without myelopathy or radiculopathy, lumbar region: Secondary | ICD-10-CM | POA: Diagnosis not present

## 2018-11-14 DIAGNOSIS — M5416 Radiculopathy, lumbar region: Secondary | ICD-10-CM | POA: Diagnosis not present

## 2018-11-14 DIAGNOSIS — M5126 Other intervertebral disc displacement, lumbar region: Secondary | ICD-10-CM | POA: Diagnosis not present

## 2018-11-14 DIAGNOSIS — M48061 Spinal stenosis, lumbar region without neurogenic claudication: Secondary | ICD-10-CM | POA: Diagnosis not present

## 2018-11-16 ENCOUNTER — Telehealth: Payer: Self-pay | Admitting: Internal Medicine

## 2018-11-16 DIAGNOSIS — M5416 Radiculopathy, lumbar region: Secondary | ICD-10-CM | POA: Diagnosis not present

## 2018-11-16 DIAGNOSIS — M48061 Spinal stenosis, lumbar region without neurogenic claudication: Secondary | ICD-10-CM | POA: Diagnosis not present

## 2018-11-16 NOTE — Telephone Encounter (Signed)
Insurance has been submitted and verified for Prolia. Patient is responsible for a $0 copay. Due anytime. Spoke to patient and informed. He is going to talk to his neurologist before scheduling.  Okay to schedule... Visit Note: Prolia ($0 copay - okay to give per Gareth Eagle) Visit Type: Nurse Provider: Nurse

## 2018-11-22 ENCOUNTER — Ambulatory Visit: Payer: Self-pay

## 2018-11-22 ENCOUNTER — Telehealth: Payer: Self-pay | Admitting: Internal Medicine

## 2018-11-22 NOTE — Telephone Encounter (Signed)
Copied from [place] [id number]. Topic: Quick Communication - Rx Refill/Question >> Nov 22, 2018  3:29 PM Gustavus Messing wrote: Medication: would like a medicine for the cough he has been having for almost a month   Preferred Pharmacy (with phone number or street name): [id number] [place], [place]. AT [address] [phone] (Phone) [phone] (Fax)    Agent: Please be advised that RX refills may take up to 3 business days. We ask that you follow-up with your pharmacy.

## 2018-11-22 NOTE — Telephone Encounter (Signed)
See triage encounter.

## 2018-11-22 NOTE — Telephone Encounter (Signed)
Patient called and says that he's been having a cough for over a month and it's not going away. He says the last time he was in the office, he mentioned it to Dr. Jenny Reichmann and agreed to give it a little more time to resolve on it's own. He says he's coughing up a little clear phlegm, no fever, no difficulty breathing, only a little runny nose. Appointment scheduled for Tuesday, 11/28/18 at 0900 with Dr. Jenny Reichmann, care advice given, patient verbalized understanding.  Reason for Disposition . Cough has been present for > 3 weeks  Answer Assessment - Initial Assessment Questions 1. ONSET: "When did the cough begin?"      1 month ago 2. SEVERITY: "How bad is the cough today?"      Not too bad 3. RESPIRATORY DISTRESS: "Describe your breathing."      Fine 4. FEVER: "Do you have a fever?" If so, ask: "What is your temperature, how was it measured, and when did it start?"     No 5. HEMOPTYSIS: "Are you coughing up any blood?" If so ask: "How much?" (flecks, streaks, tablespoons, etc.)     No 6. TREATMENT: "What have you done so far to treat the cough?" (e.g., meds, fluids, humidifier)     OTC medicine, cough drops 7. CARDIAC HISTORY: "Do you have any history of heart disease?" (e.g., heart attack, congestive heart failure)      No 8. LUNG HISTORY: "Do you have any history of lung disease?"  (e.g., pulmonary embolus, asthma, emphysema)     No 9. PE RISK FACTORS: "Do you have a history of blood clots?" (or: recent major surgery, recent prolonged travel, bedridden)     No 10. OTHER SYMPTOMS: "Do you have any other symptoms? (e.g., runny nose, wheezing, chest pain)      Maybe a little runny nose 11. PREGNANCY: "Is there any chance you are pregnant?" "When was your last menstrual period?"       N/A 12. TRAVEL: "Have you traveled out of the country in the last month?" (e.g., travel history, exposures)       No  Protocols used: COUGH - ACUTE NON-PRODUCTIVE-A-AH

## 2018-11-28 ENCOUNTER — Telehealth: Payer: Self-pay | Admitting: *Deleted

## 2018-11-28 ENCOUNTER — Telehealth: Payer: Self-pay | Admitting: Internal Medicine

## 2018-11-28 ENCOUNTER — Ambulatory Visit (INDEPENDENT_AMBULATORY_CARE_PROVIDER_SITE_OTHER): Payer: Medicare Other | Admitting: Internal Medicine

## 2018-11-28 ENCOUNTER — Other Ambulatory Visit: Payer: Self-pay

## 2018-11-28 ENCOUNTER — Encounter: Payer: Self-pay | Admitting: Internal Medicine

## 2018-11-28 ENCOUNTER — Ambulatory Visit (INDEPENDENT_AMBULATORY_CARE_PROVIDER_SITE_OTHER)
Admission: RE | Admit: 2018-11-28 | Discharge: 2018-11-28 | Disposition: A | Discharge status: Home / Self Care | Payer: Medicare Other | Source: Ambulatory Visit | Attending: Internal Medicine | Admitting: Internal Medicine

## 2018-11-28 VITALS — BP 116/78 | HR 109 | Temp 98.4°F | Ht 68.0 in | Wt 150.0 lb

## 2018-11-28 DIAGNOSIS — R6889 Other general symptoms and signs: Secondary | ICD-10-CM | POA: Diagnosis not present

## 2018-11-28 DIAGNOSIS — I2510 Atherosclerotic heart disease of native coronary artery without angina pectoris: Secondary | ICD-10-CM | POA: Diagnosis not present

## 2018-11-28 DIAGNOSIS — E785 Hyperlipidemia, unspecified: Secondary | ICD-10-CM

## 2018-11-28 DIAGNOSIS — R739 Hyperglycemia, unspecified: Secondary | ICD-10-CM | POA: Diagnosis not present

## 2018-11-28 DIAGNOSIS — J309 Allergic rhinitis, unspecified: Secondary | ICD-10-CM

## 2018-11-28 DIAGNOSIS — R05 Cough: Secondary | ICD-10-CM | POA: Diagnosis not present

## 2018-11-28 DIAGNOSIS — I2583 Coronary atherosclerosis due to lipid rich plaque: Secondary | ICD-10-CM | POA: Diagnosis not present

## 2018-11-28 DIAGNOSIS — R059 Cough, unspecified: Secondary | ICD-10-CM | POA: Insufficient documentation

## 2018-11-28 LAB — POCT INFLUENZA A/B
Influenza A, POC: NEGATIVE
Influenza B, POC: NEGATIVE

## 2018-11-28 MED ORDER — HYDROCODONE-HOMATROPINE 5-1.5 MG/5ML PO SYRP: 5.0000 mL | ORAL_SOLUTION | Freq: Four times a day (QID) | ORAL | 0 refills | Status: AC | PRN

## 2018-11-28 MED ORDER — PREDNISONE 10 MG PO TABS: ORAL_TABLET | ORAL | 0 refills | Status: DC

## 2018-11-28 MED ORDER — PROMETHAZINE-DM 6.25-15 MG/5ML PO SYRP: 5.0000 mL | ORAL_SOLUTION | Freq: Four times a day (QID) | ORAL | 1 refills | Status: DC | PRN

## 2018-11-28 MED ORDER — LEVOFLOXACIN 500 MG PO TABS: 500.0000 mg | ORAL_TABLET | Freq: Every day | ORAL | 0 refills | Status: AC

## 2018-11-28 MED ORDER — METHYLPREDNISOLONE ACETATE 80 MG/ML IJ SUSP
80.0000 mg | Freq: Once | INTRAMUSCULAR | Status: AC
  Administered 2018-11-28: 80 mg via INTRAMUSCULAR

## 2018-11-28 NOTE — Addendum Note (Signed)
Addended by: Juliet Rude on: 11/28/2018 10:38 AM   Modules accepted: Orders

## 2018-11-28 NOTE — Telephone Encounter (Signed)
Error

## 2018-11-28 NOTE — Progress Notes (Signed)
Subjective:    Patient ID: Manuel Cruz, male    DOB: [birth year], 73 y.o.   MRN: [id number]  HPI   Here with c/o 5-6 wks persistent cough, nasal congestion and slight prod cough only a few times greenish, without blood, and denies fever, chills, CP, sob, wheezing, and Pt denies orthopnea, PND, increased LE swelling, palpitations, dizziness or syncope.  No recent sick contacts.  Cough worse at night.  Nothing else makes better or worse Past Medical History:  Diagnosis Date  . Allergic rhinitis   . Allergy   . GERD (gastroesophageal reflux disease)   . Hyperlipidemia   . Sciatica of right side    recurrent  . Sleep apnea    pt denies  . Squamous cell skin cancer, earlobe   . Warts, genital    Past Surgical History:  Procedure Laterality Date  . COLONOSCOPY  08/12/2005  . [place] SURGERY  2004    reports that he has been smoking cigarettes. He has been smoking about 0.25 packs per day. He has never used smokeless tobacco. He reports current alcohol use of about 10.0 standard drinks of alcohol per week. He reports that he does not use drugs. family history includes Cancer in his mother; Hyperlipidemia in his brother and father. No Known Allergies Current Outpatient Medications on File Prior to Visit  Medication Sig Dispense Refill  . aspirin 81 MG EC tablet Take 1 tablet (81 mg total) by mouth daily. 90 tablet 1  . atorvastatin (LIPITOR) 80 MG tablet TAKE 1 TABLET DAILY 90 tablet 3  . calcitonin, salmon, (MIACALCIN) 200 UNIT/ACT nasal spray Place 1 spray into alternate nostrils daily. 3.7 mL 11  . Carboxymeth-Glycerin-Polysorb (REFRESH OPTIVE ADVANCED OP) Apply 1 drop to eye 2 (two) times daily.    . Cetirizine HCl (ZYRTEC ALLERGY) 10 MG CAPS Take 1 capsule (10 mg total) by mouth as needed. 90 capsule 3  . gabapentin (NEURONTIN) 300 MG capsule TAKE 2 CAPSULES 3 TIMES A  DAY 540 capsule 1  . ketotifen (ZADITOR) 0.025 % ophthalmic solution Place 1 drop into both eyes 2 (two) times  daily.    . Melatonin 3 MG TABS Take by mouth.    . Multiple Vitamin (MULTIVITAMIN) tablet Take 1 tablet by mouth daily.    . naproxen (NAPROSYN) 250 MG tablet Take by mouth 2 (two) times daily with a meal.    . omeprazole (PRILOSEC) 20 MG capsule Take 1 capsule (20 mg total) by mouth daily. 90 capsule 3  . Probiotic Product (PROBIOTIC PO) Take by mouth.    . traMADol (ULTRAM) 50 MG tablet Take by mouth every 6 (six) hours as needed.     No current facility-administered medications on file prior to visit.    Review of Systems  Constitutional: Negative for other unusual diaphoresis or sweats HENT: Negative for ear discharge or swelling Eyes: Negative for other worsening visual disturbances Respiratory: Negative for stridor or other swelling  Gastrointestinal: Negative for worsening distension or other blood Genitourinary: Negative for retention or other urinary change Musculoskeletal: Negative for other MSK pain or swelling Skin: Negative for color change or other new lesions Neurological: Negative for worsening tremors and other numbness  Psychiatric/Behavioral: Negative for worsening agitation or other fatigue All other system neg per pt    Objective:   Physical Exam BP 116/78   Pulse (!) 109   Temp 98.4 F (36.9 C) (Oral)   Ht 5\' 8"  (1.727 m)   Wt 150 lb (68  kg)   SpO2 92%   BMI 22.81 kg/m  VS noted, fatigued but non toxic Constitutional: Pt appears in NAD HENT: Head: NCAT.  Right Ear: External ear normal.  Left Ear: External ear normal.  Eyes: . Pupils are equal, round, and reactive to light. Conjunctivae and EOM are normal Bilat tm's with mild erythema.  Max sinus areas non tender.  Pharynx with mild erythema, no exudate Nose: without d/c or deformity Neck: Neck supple. Gross normal ROM Cardiovascular: Normal rate and regular rhythm.   Pulmonary/Chest: Effort normal and breath sounds without rales or wheezing.  Neurological: Pt is alert. At baseline orientation, motor  grossly intact Skin: Skin is warm. No rashes, other new lesions, no LE edema Psychiatric: Pt behavior is normal without agitation  No other exam findings  Rapid flu - negative  Lab Results  Component Value Date   WBC 7.7 05/03/2018   HGB 15.8 05/03/2018   HCT 46.8 05/03/2018   PLT 325.0 05/03/2018   GLUCOSE 108 (H) 11/08/2018   CHOL 163 11/08/2018   TRIG 109.0 11/08/2018   HDL 60.90 11/08/2018   LDLDIRECT 93.0 05/03/2018   LDLCALC 80 11/08/2018   ALT 35 11/08/2018   AST 31 11/08/2018   NA 137 11/08/2018   K 4.5 11/08/2018   CL 99 11/08/2018   CREATININE 0.84 11/08/2018   BUN 13 11/08/2018   CO2 28 11/08/2018   TSH 2.63 05/03/2018   PSA 1.65 05/03/2018   HGBA1C 6.0 11/08/2018       Assessment & Plan:

## 2018-11-28 NOTE — Telephone Encounter (Signed)
I called patient to f/u on his cough. He is scheduled today to see PCP at 9:00 am. He states this cough has been ongoing x 5 weeks. He denies fever, chills and SOB any during the whole duration. He states his cough was discusses at his 11/08/18 OV and Dr. Jenny Reichmann advised him to f/u if his sxs were not improved x 2 weeks. Advised pt to keep OV today with PCP at 9:00.

## 2018-11-28 NOTE — Assessment & Plan Note (Signed)
stable overall by history and exam, recent data reviewed with pt, and pt to continue medical treatment as before,  to f/u any worsening symptoms or concerns  

## 2018-11-28 NOTE — Addendum Note (Signed)
Addended by: Biagio Borg on: 11/28/2018 12:36 PM   Modules accepted: Orders

## 2018-11-28 NOTE — Assessment & Plan Note (Signed)
Rapid flu negative and little s/s of infectious illness, suspect allergies vs other pulmonary; for depomedrol IM 80, predpac asd, cough med prn, and cxr r/o other

## 2018-11-28 NOTE — Assessment & Plan Note (Signed)
For tx as above,  to f/u any worsening symptoms or concerns

## 2018-11-28 NOTE — Assessment & Plan Note (Signed)
Improved, cont low chol diet

## 2018-11-28 NOTE — Patient Instructions (Addendum)
Your Rapid Flu test was negative  You had the steroid shot today  Please take all new medication as prescribed - the prednisone, and cough med as needed  Please also take OTC Nasacort for ongoing allergy control  Please continue all other medications as before, though   Please have the pharmacy call with any other refills you may need.  Please continue your efforts at being more active, low cholesterol diet, and weight control.  You are otherwise up to date with prevention measures today.  Please keep your appointments with your specialists as you may have planned  Please go to the XRAY Department in the Basement (go straight as you get off the elevator) for the x-ray testing  You will be contacted by phone if any changes need to be made immediately.  Otherwise, you will receive a letter about your results with an explanation, but please check with MyChart first.  Please remember to sign up for MyChart if you have not done so, as this will be important to you in the future with finding out test results, communicating by private email, and scheduling acute appointments online when needed.

## 2018-11-29 ENCOUNTER — Telehealth: Payer: Self-pay

## 2018-11-29 NOTE — Telephone Encounter (Signed)
Pt has viewed results via MyChart  

## 2018-11-29 NOTE — Telephone Encounter (Signed)
-----   Message from Biagio Borg, MD sent at 11/28/2018  1:39 PM EDT ----- Left message on MyChart, pt to cont same tx except  The test results show that your current treatment is OK, except the chest xray does have a suggestion of Left Lower Pneumonia. Given the symptoms today, we should treat with an antibiotic.  I will send to the pharmacy, and you should hear from the office as well.  Manuel Cruz to please inform pt of possible LLL pneumonia, I will send rx

## 2018-12-05 ENCOUNTER — Telehealth: Payer: Self-pay

## 2018-12-05 DIAGNOSIS — J189 Pneumonia, unspecified organism: Secondary | ICD-10-CM

## 2018-12-05 NOTE — Addendum Note (Signed)
Addended by: Biagio Borg on: 12/05/2018 04:55 PM   Modules accepted: Orders

## 2018-12-05 NOTE — Telephone Encounter (Signed)
Copied from [place] [id number]. Topic: General - Inquiry >> Dec 05, 2018  2:36 PM Reyne Dumas L wrote: Reason for CRM:   Pt called and left message on [facility] voicemail box.  States that he was given RX for pneumonia and he only has two days of pills left and is still having chest congestion.  Pt wants to know if he should be on antibiotic longer

## 2018-12-05 NOTE — Telephone Encounter (Signed)
It should be ok to just finish what he has, as long as no worsening fever, congestion, wheezing, or pain or other unusual symtpoms;  Can also try otc mucinex bid prn

## 2018-12-05 NOTE — Telephone Encounter (Signed)
Ok for xray

## 2018-12-05 NOTE — Telephone Encounter (Signed)
Pt has been informed and expressed understanding. Pt would like to know if he will be having another chest x-ray to make sure that the pneumonia has cleared? Please advise.

## 2018-12-06 NOTE — Telephone Encounter (Signed)
Pt has been informed.

## 2018-12-11 ENCOUNTER — Other Ambulatory Visit: Payer: Self-pay

## 2018-12-11 ENCOUNTER — Ambulatory Visit (INDEPENDENT_AMBULATORY_CARE_PROVIDER_SITE_OTHER)
Admission: RE | Admit: 2018-12-11 | Discharge: 2018-12-11 | Disposition: A | Discharge status: Home / Self Care | Payer: Medicare Other | Source: Ambulatory Visit | Attending: Internal Medicine | Admitting: Internal Medicine

## 2018-12-11 DIAGNOSIS — K449 Diaphragmatic hernia without obstruction or gangrene: Secondary | ICD-10-CM | POA: Diagnosis not present

## 2018-12-11 DIAGNOSIS — J189 Pneumonia, unspecified organism: Secondary | ICD-10-CM

## 2018-12-13 ENCOUNTER — Inpatient Hospital Stay: Admission: RE | Admit: 2018-12-13 | Payer: Medicare Other | Source: Ambulatory Visit

## 2019-01-09 DIAGNOSIS — M48061 Spinal stenosis, lumbar region without neurogenic claudication: Secondary | ICD-10-CM | POA: Diagnosis not present

## 2019-01-09 DIAGNOSIS — M5416 Radiculopathy, lumbar region: Secondary | ICD-10-CM | POA: Diagnosis not present

## 2019-01-17 DIAGNOSIS — M5416 Radiculopathy, lumbar region: Secondary | ICD-10-CM | POA: Diagnosis not present

## 2019-02-06 DIAGNOSIS — M5416 Radiculopathy, lumbar region: Secondary | ICD-10-CM | POA: Diagnosis not present

## 2019-02-09 DIAGNOSIS — H2513 Age-related nuclear cataract, bilateral: Secondary | ICD-10-CM | POA: Diagnosis not present

## 2019-02-09 DIAGNOSIS — H04123 Dry eye syndrome of bilateral lacrimal glands: Secondary | ICD-10-CM | POA: Diagnosis not present

## 2019-02-09 DIAGNOSIS — H10413 Chronic giant papillary conjunctivitis, bilateral: Secondary | ICD-10-CM | POA: Diagnosis not present

## 2019-02-09 DIAGNOSIS — H35363 Drusen (degenerative) of macula, bilateral: Secondary | ICD-10-CM | POA: Diagnosis not present

## 2019-02-12 ENCOUNTER — Telehealth: Payer: Self-pay | Admitting: *Deleted

## 2019-02-12 NOTE — Telephone Encounter (Signed)

## 2019-02-13 ENCOUNTER — Other Ambulatory Visit: Payer: Self-pay

## 2019-02-13 ENCOUNTER — Ambulatory Visit (INDEPENDENT_AMBULATORY_CARE_PROVIDER_SITE_OTHER)
Admission: RE | Admit: 2019-02-13 | Discharge: 2019-02-13 | Disposition: A | Discharge status: Home / Self Care | Payer: Self-pay | Source: Ambulatory Visit | Attending: Internal Medicine | Admitting: Internal Medicine

## 2019-02-13 DIAGNOSIS — I2583 Coronary atherosclerosis due to lipid rich plaque: Secondary | ICD-10-CM

## 2019-02-13 DIAGNOSIS — I2510 Atherosclerotic heart disease of native coronary artery without angina pectoris: Secondary | ICD-10-CM

## 2019-03-22 DIAGNOSIS — M5416 Radiculopathy, lumbar region: Secondary | ICD-10-CM | POA: Diagnosis not present

## 2019-03-22 DIAGNOSIS — R030 Elevated blood-pressure reading, without diagnosis of hypertension: Secondary | ICD-10-CM | POA: Diagnosis not present

## 2019-03-23 DIAGNOSIS — B079 Viral wart, unspecified: Secondary | ICD-10-CM | POA: Diagnosis not present

## 2019-03-23 DIAGNOSIS — A630 Anogenital (venereal) warts: Secondary | ICD-10-CM | POA: Diagnosis not present

## 2019-04-17 ENCOUNTER — Other Ambulatory Visit: Payer: Self-pay | Admitting: Internal Medicine

## 2019-04-30 DIAGNOSIS — Z23 Encounter for immunization: Secondary | ICD-10-CM | POA: Diagnosis not present

## 2019-05-08 NOTE — Progress Notes (Addendum)
Subjective:   Manuel Cruz is a 73 y.o. male who presents for Medicare Annual/Subsequent preventive examination.  Review of Systems:   Cardiac Risk Factors include: advanced age (>52men, >58 women) Sleep patterns: gets up 0-1 times nightly to void and sleeps 6-7 hours nightly.    Home Safety/Smoke Alarms: Feels safe in home. Smoke alarms in place.  Living environment; residence and Firearm Safety: 1-story house/ trailer. Lives alone, no needs for DME, good support system Seat Belt Safety/Bike Helmet: Wears seat belt.    PSA-  Lab Results  Component Value Date   PSA 1.65 05/03/2018   PSA 1.42 04/26/2017   PSA 1.66 04/23/2016       Objective:    Vitals: BP 124/84    Pulse 100   There is no height or weight on file to calculate BMI.  Advanced Directives 05/09/2019 01/09/2018 01/04/2017 09/03/2015 07/23/2015  Does Patient Have a Medical Advance Directive? Yes Yes Yes Yes Yes  Type of Paramedic of [place];Living will [place];Living will [place];Living will Living will [place] in Chart? 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   Outer Right Rim of Ear (Cx3,5FU)   Squamous cell carcinoma in situ (SCCIS) 02/01/2012   Left Sideburn (tx p bx)   Squamous cell carcinoma in situ (SCCIS) 04/05/2016   Left Forearm(Cx3,5FU), Left Sideburn  Sup(Cx3,5FU), and Left Sideburn Inf (Cx3,5FU)   Squamous cell carcinoma in situ (SCCIS) 05/06/2016   Left Side Neck (tx p bx)   Squamous cell skin cancer, earlobe    Warts, genital    Past Surgical History:  Procedure Laterality Date   COLONOSCOPY  08/12/2005   LUMBAR [place] SURGERY  2004   Family History  Problem Relation Age of Onset   Hyperlipidemia Father    Hyperlipidemia Brother    Cancer Mother        lung   Social History   Socioeconomic History   Marital status: Single    Spouse name: Not on file   Number of children: 0   Years of education: Not on file   Highest education level: Not on file  Occupational History   Occupation: insurance  Social Needs   Financial resource strain: Not hard at all   Food insecurity    Worry: Never true    Inability: Never true   Transportation needs    Medical: No    Non-medical: No  Tobacco Use   Smoking status: Current Every Day Smoker    Packs/day: 0.25    Types: Cigarettes   Smokeless tobacco: Never Used  Substance and Sexual Activity   Alcohol use: Yes    Alcohol/week: 10.0 standard drinks    Types: 10 Standard drinks or equivalent per week    Comment: [name]   Drug  use: No   Sexual activity: Yes  Lifestyle   Physical activity    Days per week: 0 days    Minutes per session: 0 min   Stress: Not at all  Relationships   Social connections    Talks on phone: More than three times a week    Gets together: More than three times a week    Attends religious service: More than 4 times per year    Active member of club or organization: Yes    Attends meetings of clubs or organizations: 1 to 4 times per year    Relationship status: Not on file  Other Topics Concern   Not on file  Social History Narrative   Not on file    Outpatient Encounter Medications as of 05/09/2019  Medication Sig   glucosamine-chondroitin 500-400 MG tablet Take 1 tablet by mouth 3 (three) times daily.   aspirin 81 MG  EC tablet Take 1 tablet (81 mg total) by mouth daily.   atorvastatin (LIPITOR) 80 MG tablet TAKE 1 TABLET DAILY   calcitonin, salmon, (MIACALCIN) 200 UNIT/ACT nasal spray Place 1 spray into alternate nostrils daily.   Carboxymeth-Glycerin-Polysorb (REFRESH OPTIVE ADVANCED OP) Apply 1 drop to eye 2 (two) times daily.   Cetirizine HCl (ZYRTEC ALLERGY) 10 MG CAPS Take 1 capsule (10 mg total) by mouth as needed.   gabapentin (NEURONTIN) 300 MG capsule TAKE 2 CAPSULES 3 TIMES A  DAY   ketotifen (ZADITOR) 0.025 % ophthalmic solution Place 1 drop into both eyes 2 (two) times daily.   Melatonin 3 MG TABS Take by mouth.   Multiple Vitamin (MULTIVITAMIN) tablet Take 1 tablet by mouth daily.   naproxen (NAPROSYN) 250 MG tablet Take by mouth 2 (two) times daily with a meal.   omeprazole (PRILOSEC) 20 MG capsule Take 1 capsule (20 mg total) by mouth daily.   predniSONE (DELTASONE) 10 MG tablet 3 tabs by mouth per day for 3 days,2tabs per day for 3 days,1tab per day for 3 days   Probiotic Product (PROBIOTIC PO) Take by mouth.   promethazine-dextromethorphan (PROMETHAZINE-DM) 6.25-15 MG/5ML syrup Take 5 mLs by mouth 4 (four) times daily as needed for cough.   traMADol (ULTRAM) 50 MG tablet Take by mouth every 6 (six) hours as needed.   No facility-administered encounter medications on file as of 05/09/2019.     Activities of Daily Living In your present state of health, do you have any difficulty performing the following activities: 05/09/2019  Hearing? N  Vision? N  Difficulty concentrating or making decisions? N  Walking or climbing stairs? N  Dressing or bathing? N  Doing errands, shopping? N  Preparing Food and eating ? N  Using the Toilet? N  In the past six months, have you accidently leaked urine? N  Do you have problems with loss of bowel control? N  Managing your Medications? N  Managing your Finances? N  Housekeeping or managing your Housekeeping? N  Some recent data might be  hidden    Patient Care Team: Biagio Borg, MD as PCP - General   Assessment:   This is a routine wellness examination for [place]. Physical assessment deferred to PCP.   Exercise Activities and Dietary recommendations Current Exercise Habits: Home exercise routine, Time (Minutes): 30, Frequency (Times/Week): 3, Weekly Exercise (Minutes/Week): 90, Intensity: Mild, Exercise limited by: orthopedic condition(s)  Diet (meal preparation, eat out, water intake, caffeinated beverages, dairy products, fruits and vegetables): in general, a "healthy" diet  , well  balanced   Continue to eat heart healthy diet (full of fruits, vegetables, whole grains, lean protein, water--limit salt, fat, and sugar intake) and increase physical activity as tolerated.   Goals     mix of ideas     Volunteering to help others, reading to spiritually enhance life, continue to be active, eat healthy, enjoy life.     Patient Stated     I want to tweak a few things, like increase water intake to 2 glasses daily, eat heart healthy to lower triglycerides and blood pressure. I will watch how much salt and saturated fat I eat.       Fall Risk Fall Risk  05/09/2019 05/09/2019 11/08/2018 05/03/2018 01/09/2018  Falls in the past year? 1 1 - Yes No  Number falls in past yr: 1 1 1 2  or more -  Injury with Fall? 0 0 1 Yes -  Risk for fall due to : - - - - Impaired mobility;Impaired balance/gait  Follow up Falls prevention discussed - - - -    Depression Screen PHQ 2/9 Scores 05/09/2019 05/09/2019 05/09/2019 11/08/2018  PHQ - 2 Score 0 0 0 1  PHQ- 9 Score - - 0 -    Cognitive Function       Ad8 score reviewed for issues:  Issues making decisions: no  Less interest in hobbies / activities: no  Repeats questions, stories (family complaining): no  Trouble using ordinary gadgets (microwave, computer, phone):no  Forgets the month or year: no  Mismanaging finances: no  Remembering appts: no  Daily problems with  thinking and/or memory: no Ad8 score is= 0    Immunization History  Administered Date(s) Administered   H1N1 08/13/2008   Influenza Whole 06/13/2008   Influenza, High Dose Seasonal PF 06/08/2018   Influenza,inj,Quad PF,6+ Mos 06/11/2014   Influenza-Unspecified 04/25/2019   Pneumococcal Conjugate-13 03/15/2013, 04/10/2014   Pneumococcal Polysaccharide-23 04/17/2015   Td 10/24/2008   Tdap 11/08/2018   Zoster 01/19/2010   Zoster Recombinat (Shingrix) 06/02/2017, 11/27/2017   Screening Tests Health Maintenance  Topic Date Due   COLONOSCOPY  09/02/2020   TETANUS/TDAP  11/08/2028   INFLUENZA VACCINE  Completed   Hepatitis C Screening  Completed   PNA vac Low Risk Adult  Completed       Plan:    Reviewed health maintenance screenings with patient today and relevant education, vaccines, and/or referrals were provided.   I have personally reviewed and noted the following in the patients chart:    Medical and social history  Use of alcohol, tobacco or illicit drugs   Current medications and supplements  Functional ability and status  Nutritional status  Physical activity  Advanced directives  List of other physicians  Vitals  Screenings to include cognitive, depression, and falls  Referrals and appointments  In addition, I have reviewed and discussed with patient certain preventive protocols, quality metrics, and best practice recommendations. A written personalized care plan for preventive services as well as general preventive health recommendations were provided to patient.     Michiel Cowboy, RN  05/09/2019  Medical screening examination/treatment/procedure(s) were performed by non-physician practitioner and as supervising physician I was immediately available for consultation/collaboration. I agree with above. Cathlean Cower, MD

## 2019-05-09 ENCOUNTER — Encounter: Payer: Self-pay | Admitting: Internal Medicine

## 2019-05-09 ENCOUNTER — Other Ambulatory Visit: Payer: Self-pay

## 2019-05-09 ENCOUNTER — Ambulatory Visit (INDEPENDENT_AMBULATORY_CARE_PROVIDER_SITE_OTHER): Payer: Medicare Other | Admitting: Internal Medicine

## 2019-05-09 ENCOUNTER — Ambulatory Visit (INDEPENDENT_AMBULATORY_CARE_PROVIDER_SITE_OTHER): Payer: Medicare Other | Admitting: *Deleted

## 2019-05-09 VITALS — BP 124/84 | HR 100 | Ht 68.0 in | Wt 148.0 lb

## 2019-05-09 VITALS — BP 124/84 | HR 100 | Temp 98.4°F | Ht 68.0 in | Wt 148.0 lb

## 2019-05-09 DIAGNOSIS — S32000S Wedge compression fracture of unspecified lumbar vertebra, sequela: Secondary | ICD-10-CM

## 2019-05-09 DIAGNOSIS — I2510 Atherosclerotic heart disease of native coronary artery without angina pectoris: Secondary | ICD-10-CM

## 2019-05-09 DIAGNOSIS — Z0000 Encounter for general adult medical examination without abnormal findings: Secondary | ICD-10-CM

## 2019-05-09 DIAGNOSIS — M8000XD Age-related osteoporosis with current pathological fracture, unspecified site, subsequent encounter for fracture with routine healing: Secondary | ICD-10-CM | POA: Diagnosis not present

## 2019-05-09 DIAGNOSIS — E785 Hyperlipidemia, unspecified: Secondary | ICD-10-CM | POA: Diagnosis not present

## 2019-05-09 DIAGNOSIS — I2583 Coronary atherosclerosis due to lipid rich plaque: Secondary | ICD-10-CM

## 2019-05-09 DIAGNOSIS — R739 Hyperglycemia, unspecified: Secondary | ICD-10-CM | POA: Diagnosis not present

## 2019-05-09 DIAGNOSIS — E2839 Other primary ovarian failure: Secondary | ICD-10-CM

## 2019-05-09 LAB — POCT GLYCOSYLATED HEMOGLOBIN (HGB A1C): Hemoglobin A1C: 5.4 % (ref 4.0–5.6)

## 2019-05-09 NOTE — Assessment & Plan Note (Signed)
For dxa to assess, consider prolia

## 2019-05-09 NOTE — Progress Notes (Signed)
Subjective:    Patient ID: Manuel Cruz, male    DOB: 21-Jan-1946, 73 y.o.   MRN: [id number]  HPI    Here to f/u; overall doing ok,  Pt denies chest pain, increasing sob or doe, wheezing, orthopnea, PND, increased LE swelling, palpitations, dizziness or syncope.  Pt denies new neurological symptoms such as new headache, or facial or extremity weakness or numbness.  Pt denies polydipsia, polyuria, or low sugar episode.  Pt states overall good compliance with meds, mostly trying to follow appropriate diet, with wt overall stable,  but little exercise however. Also Pt continues to have recurring LBP without change in severity, bowel or bladder change, fever, wt loss,  worsening LE pain/numbness/weakness, gait change or falls.  Has been seeing chiropracter, and prior to that his NS. No falls in 6 mo.Now s/p compression fx, now with left sciatica like pain mostly , just very occasionally on the right.   Past Medical History:  Diagnosis Date  . Allergic rhinitis   . Allergy   . GERD (gastroesophageal reflux disease)   . Hyperlipidemia   . Sciatica of right side    recurrent  . Sleep apnea    pt denies  . 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 Squamous cell carcinoma in situ (SCCIS) 02/01/2012   Left Sideburn (tx p bx)  . Squamous cell carcinoma in situ (SCCIS) 04/05/2016   Left Forearm(Cx3,5FU), Left Sideburn Sup(Cx3,5FU), and Left Sideburn Inf (Cx3,5FU)  . Squamous cell carcinoma in situ (SCCIS) 05/06/2016   Left Side Neck (tx p bx)  . Squamous cell skin cancer, earlobe   . Warts, genital    Past Surgical History:  Procedure Laterality Date  . COLONOSCOPY  08/12/2005  . [place] SURGERY  2004    reports that he has been smoking cigarettes. He has been smoking about 0.25 packs per day. He has never used smokeless tobacco. He reports current alcohol use of about 10.0 standard drinks of alcohol per week. He reports that he does not use  drugs. family history includes Cancer in his mother; Hyperlipidemia in his brother and father. No Known Allergies Current Outpatient Medications on File Prior to Visit  Medication Sig Dispense Refill  . aspirin 81 MG EC tablet Take 1 tablet (81 mg total) by mouth daily. 90 tablet 1  . atorvastatin (LIPITOR) 80 MG tablet TAKE 1 TABLET DAILY 90 tablet 3  . calcitonin, salmon, (MIACALCIN) 200 UNIT/ACT nasal spray Place 1 spray into alternate nostrils daily. 3.7 mL 11  . Carboxymeth-Glycerin-Polysorb (REFRESH OPTIVE ADVANCED OP) Apply 1 drop to eye 2 (two) times daily.    . Cetirizine HCl (ZYRTEC ALLERGY) 10 MG CAPS Take 1 capsule (10 mg total) by mouth as needed. 90 capsule 3  . gabapentin (NEURONTIN) 300 MG capsule TAKE 2 CAPSULES 3 TIMES A  DAY 540 capsule 0  . ketotifen (ZADITOR) 0.025 % ophthalmic solution Place 1 drop into both eyes 2 (two) times daily.    . Melatonin 3 MG TABS Take by mouth.    . Multiple Vitamin (MULTIVITAMIN) tablet Take 1 tablet by mouth daily.    . naproxen (NAPROSYN) 250 MG tablet Take by mouth 2 (two) times daily with a meal.    . omeprazole (PRILOSEC) 20 MG capsule Take 1 capsule (20 mg total) by mouth daily. 90 capsule 3  . predniSONE (DELTASONE) 10 MG tablet 3 tabs by mouth per day for 3 days,2tabs per day  for 3 days,1tab per day for 3 days 18 tablet 0  . Probiotic Product (PROBIOTIC PO) Take by mouth.    . promethazine-dextromethorphan (PROMETHAZINE-DM) 6.25-15 MG/5ML syrup Take 5 mLs by mouth 4 (four) times daily as needed for cough. 118 mL 1  . traMADol (ULTRAM) 50 MG tablet Take by mouth every 6 (six) hours as needed.     No current facility-administered medications on file prior to visit.    Review of Systems  Constitutional: Negative for other unusual diaphoresis or sweats HENT: Negative for ear discharge or swelling Eyes: Negative for other worsening visual disturbances Respiratory: Negative for stridor or other swelling  Gastrointestinal: Negative for  worsening distension or other blood Genitourinary: Negative for retention or other urinary change Musculoskeletal: Negative for other MSK pain or swelling Skin: Negative for color change or other new lesions Neurological: Negative for worsening tremors and other numbness  Psychiatric/Behavioral: Negative for worsening agitation or other fatigue All other system neg per pt    Objective:   Physical Exam BP 124/84   Pulse 100   Temp 98.4 F (36.9 C) (Oral)   Ht 5\' 8"  (1.727 m)   Wt 148 lb (67.1 kg)   SpO2 97%   BMI 22.50 kg/m  VS noted,  Constitutional: Pt appears in NAD HENT: Head: NCAT.  Right Ear: External ear normal.  Left Ear: External ear normal.  Eyes: . Pupils are equal, round, and reactive to light. Conjunctivae and EOM are normal Nose: without d/c or deformity Neck: Neck supple. Gross normal ROM Cardiovascular: Normal rate and regular rhythm.   Pulmonary/Chest: Effort normal and breath sounds without rales or wheezing.  Abd:  Soft, NT, ND, + BS, no organomegaly Neurological: Pt is alert. At baseline orientation, motor grossly intact Skin: Skin is warm. No rashes, other new lesions, no LE edema Psychiatric: Pt behavior is normal without agitation  No other exam findings  POCT glycosylated hemoglobin (Hb A1C) Order: [id number] Status:  Final result Visible to patient:  No (not released) Dx:  Hyperglycemia  Ref Range & Units 14:10 84mo ago 65yr ago 46yr ago 7yr ago  Hemoglobin A1C 4.0 - 5.6 % 5.4  6.0 R, CM  5.9 R, CM  5.7 R, CM  5.5           Assessment & Plan:

## 2019-05-09 NOTE — Patient Instructions (Addendum)
Please schedule the bone density test before leaving today at the scheduling desk (where you check out  Your A1c was ok today (which means the sugar is doing well)  Please continue all other medications as before, and refills have been done if requested.  Please have the pharmacy call with any other refills you may need.  Please continue your efforts at being more active, low cholesterol diet, and weight control.  Please keep your appointments with your specialists as you may have planned  Please return in 6 months, or sooner if needed

## 2019-05-09 NOTE — Patient Instructions (Addendum)
Continue doing brain stimulating activities (puzzles, reading, adult coloring books, staying active) to keep memory sharp.   Continue to eat heart healthy diet (full of fruits, vegetables, whole grains, lean protein, water--limit salt, fat, and sugar intake) and increase physical activity as tolerated.   [name] , Thank you for taking time to come for your Medicare Wellness Visit. I appreciate your ongoing commitment to your health goals. Please review the following plan we discussed and let me know if I can assist you in the future.   These are the goals we discussed: Goals    . mix of ideas     Volunteering to help others, reading to spiritually enhance life, continue to be active, eat healthy, enjoy life.    . Patient Stated     I want to tweak a few things, like increase water intake to 2 glasses daily, eat heart healthy to lower triglycerides and blood pressure. I will watch how much salt and saturated fat I eat.       This is a list of the screening recommended for you and due dates:  Health Maintenance  Topic Date Due  . Colon Cancer Screening  09/02/2020  . Tetanus Vaccine  11/08/2028  . Flu Shot  Completed  .  Hepatitis C: One time screening is recommended by Center for Disease Control  (CDC) for  adults born from [birth year] through 1964.   Completed  . Pneumonia vaccines  Completed    Preventive Care 71 Years and Older, Male Preventive care refers to lifestyle choices and visits with your health care provider that can promote health and wellness. This includes:  A yearly physical exam. This is also called an annual well check.  Regular dental and eye exams.  Immunizations.  Screening for certain conditions.  Healthy lifestyle choices, such as diet and exercise. What can I expect for my preventive care visit? Physical exam Your health care provider will check:  Height and weight. These may be used to calculate body mass index (BMI), which is a measurement that tells  if you are at a healthy weight.  Heart rate and blood pressure.  Your skin for abnormal spots. Counseling Your health care provider may ask you questions about:  Alcohol, tobacco, and drug use.  Emotional well-being.  Home and relationship well-being.  Sexual activity.  Eating habits.  History of falls.  Memory and ability to understand (cognition).  Work and work Statistician. What immunizations do I need?  Influenza (flu) vaccine  This is recommended every year. Tetanus, diphtheria, and pertussis (Tdap) vaccine  You may need a Td booster every 10 years. Varicella (chickenpox) vaccine  You may need this vaccine if you have not already been vaccinated. Zoster (shingles) vaccine  You may need this after age 72. Pneumococcal conjugate (PCV13) vaccine  One dose is recommended after age 42. Pneumococcal polysaccharide (PPSV23) vaccine  One dose is recommended after age 40. Measles, mumps, and rubella (MMR) vaccine  You may need at least one dose of MMR if you were born in 1957 or later. You may also need a second dose. Meningococcal conjugate (MenACWY) vaccine  You may need this if you have certain conditions. Hepatitis A vaccine  You may need this if you have certain conditions or if you travel or work in places where you may be exposed to hepatitis A. Hepatitis B vaccine  You may need this if you have certain conditions or if you travel or work in places where you may be  exposed to hepatitis B. Haemophilus influenzae type b (Hib) vaccine  You may need this if you have certain conditions. You may receive vaccines as individual doses or as more than one vaccine together in one shot (combination vaccines). Talk with your health care provider about the risks and benefits of combination vaccines. What tests do I need? Blood tests  Lipid and cholesterol levels. These may be checked every 5 years, or more frequently depending on your overall health.  Hepatitis C  test.  Hepatitis B test. Screening  Lung cancer screening. You may have this screening every year starting at age 53 if you have a 30-pack-year history of smoking and currently smoke or have quit within the past 15 years.  Colorectal cancer screening. All adults should have this screening starting at age 4 and continuing until age 60. Your health care provider may recommend screening at age 66 if you are at increased risk. You will have tests every 1-10 years, depending on your results and the type of screening test.  Prostate cancer screening. Recommendations will vary depending on your family history and other risks.  Diabetes screening. This is done by checking your blood sugar (glucose) after you have not eaten for a while (fasting). You may have this done every 1-3 years.  Abdominal aortic aneurysm (AAA) screening. You may need this if you are a current or former smoker.  Sexually transmitted disease (STD) testing. Follow these instructions at home: Eating and drinking  Eat a diet that includes fresh fruits and vegetables, whole grains, lean protein, and low-fat dairy products. Limit your intake of foods with high amounts of sugar, saturated fats, and salt.  Take vitamin and mineral supplements as recommended by your health care provider.  Do not drink alcohol if your health care provider tells you not to drink.  If you drink alcohol: ? Limit how much you have to 0-2 drinks a day. ? Be aware of how much alcohol is in your drink. In the [place] one drink equals one 12 oz bottle of beer (355 mL), one 5 oz glass of wine (148 mL), or one 1 oz glass of hard liquor (44 mL). Lifestyle  Take daily care of your teeth and gums.  Stay active. Exercise for at least 30 minutes on 5 or more days each week.  Do not use any products that contain nicotine or tobacco, such as cigarettes, e-cigarettes, and chewing tobacco. If you need help quitting, ask your health care provider.  If you are  sexually active, practice safe sex. Use a condom or other form of protection to prevent STIs (sexually transmitted infections).  Talk with your health care provider about taking a low-dose aspirin or statin. What's next?  Visit your health care provider once a year for a well check visit.  Ask your health care provider how often you should have your eyes and teeth checked.  Stay up to date on all vaccines. This information is not intended to replace advice given to you by your health care provider. Make sure you discuss any questions you have with your health care provider. Document Released: 09/26/2015 Document Revised: 08/24/2018 Document Reviewed: 08/24/2018 Elsevier Patient Education  2020 [employer].

## 2019-05-12 ENCOUNTER — Encounter: Payer: Self-pay | Admitting: Internal Medicine

## 2019-05-12 NOTE — Assessment & Plan Note (Signed)
stable overall by history and exam, recent data reviewed with pt, and pt to continue medical treatment as before,  to f/u any worsening symptoms or concerns  

## 2019-06-15 ENCOUNTER — Other Ambulatory Visit: Payer: Self-pay | Admitting: Internal Medicine

## 2019-07-06 ENCOUNTER — Other Ambulatory Visit: Payer: Self-pay | Admitting: Internal Medicine

## 2019-07-09 ENCOUNTER — Telehealth: Payer: Self-pay

## 2019-07-09 NOTE — Telephone Encounter (Signed)
Please advise.   I will address medications when I follow up with pt. He had a wellness visit with Sharee Pimple in August. From my understanding she doesn't do refills so I will see what he needs when I call.    Copied from [place] [phone]. Topic: General - Other >> Jul 09, 2019 10:21 AM Rutherford Nail, NT wrote: Reason for CRM: Patient and Gaynelle Arabian calling and states that the patient has a bone density scheduled for Thursday 07/12/2019. States that they spoke with medicare and medicare told them that they would only cover the bone density scan if it was medically necessary since it had not been 24 months since his last scan. Patient would like a call to discuss if this scan is necessary. Please advise.   Patient also states that he has his annual back in 04/2019 and his medications were not called in then. States that he is needing all of his medications sent to the pharmacy. Please advise.

## 2019-07-10 NOTE — Telephone Encounter (Signed)
Ok to call pt to see if he has a question

## 2019-07-10 NOTE — Telephone Encounter (Signed)
Called pt, LVM.   

## 2019-07-10 NOTE — Telephone Encounter (Signed)
Pt still has questions about upcoming Bone Density Scan.  Please call pt today, as scan is sched for Thursday, 10/29.

## 2019-07-10 NOTE — Telephone Encounter (Addendum)
Pt wants to know if it is medically necessary for his bone density test so that insurance can pay. He needs to know before his appt on 07/12/19. Please advise. Stated he would call back before end of business today .

## 2019-07-11 NOTE — Telephone Encounter (Signed)
Ok to cancel the bone density as this was just done in July 2019

## 2019-07-11 NOTE — Telephone Encounter (Signed)
Pt has been informed.

## 2019-07-11 NOTE — Telephone Encounter (Signed)
Patient called in stating he is still waiting for Shirron to give him a call. Please advise as he is wanting an answer before test tomorrow.

## 2019-07-12 ENCOUNTER — Inpatient Hospital Stay: Admission: RE | Admit: 2019-07-12 | Payer: Medicare Other | Source: Ambulatory Visit

## 2019-07-24 ENCOUNTER — Telehealth: Payer: Self-pay

## 2019-07-24 DIAGNOSIS — M5430 Sciatica, unspecified side: Secondary | ICD-10-CM

## 2019-07-24 NOTE — Telephone Encounter (Signed)
Copied from [place] [phone]. Topic: Referral - Request for Referral >> Jul 24, 2019  2:15 PM Virl Axe D wrote: Has patient seen PCP for this complaint? Yes *If NO, is insurance requiring patient see PCP for this issue before PCP can refer them? Referral for which specialty: Neurology Preferred provider/office: [facility] Reason for referral: Sciatica  Pt called to schedule appointment however a referral is needed.

## 2019-07-25 NOTE — Addendum Note (Signed)
Addended by: Biagio Borg on: 07/25/2019 12:44 PM   Modules accepted: Orders

## 2019-07-25 NOTE — Telephone Encounter (Signed)
Referral done

## 2019-07-29 ENCOUNTER — Other Ambulatory Visit: Payer: Self-pay | Admitting: Internal Medicine

## 2019-09-14 DIAGNOSIS — M8580 Other specified disorders of bone density and structure, unspecified site: Secondary | ICD-10-CM

## 2019-09-14 HISTORY — DX: Other specified disorders of bone density and structure, unspecified site: M85.80

## 2019-09-24 ENCOUNTER — Other Ambulatory Visit: Payer: Self-pay

## 2019-09-24 ENCOUNTER — Encounter: Payer: Self-pay | Admitting: Diagnostic Neuroimaging

## 2019-09-24 ENCOUNTER — Ambulatory Visit (INDEPENDENT_AMBULATORY_CARE_PROVIDER_SITE_OTHER): Payer: Medicare Other | Admitting: Diagnostic Neuroimaging

## 2019-09-24 VITALS — BP 142/93 | HR 98 | Ht 68.0 in | Wt 154.2 lb

## 2019-09-24 DIAGNOSIS — M48062 Spinal stenosis, lumbar region with neurogenic claudication: Secondary | ICD-10-CM

## 2019-09-24 NOTE — Progress Notes (Signed)
[facility]  PATIENT: Manuel Cruz DOB: Jan 28, 1946  REFERRING CLINICIAN: Biagio Borg, MD HISTORY FROM: patient and SO REASON FOR VISIT: new consult    HISTORICAL  CHIEF COMPLAINT:  Chief Complaint  Patient presents with  . Pain    rm 7 New Pt,  sig other- [name], "Sciatic nerve pain since July 2019"    HISTORY OF PRESENT ILLNESS:   74 year old male here for evaluation of left leg pain and numbness.  July 2019 patient had onset of low back pain rating to the left leg, had MRI showing degenerative changes and spinal stenosis.  He underwent lumbar spine surgery on September 2019 with mild benefit of back pain symptoms.  6 weeks later pain returned.  Since that time symptoms have continued.  Symptoms worsen the last months.  Now patient has significant pain in left hip, left knee, left foot, worse when he stands up or walks a long time.  Patient tried some over-the-counter medications, gabapentin, chiropractic treatments with mild relief.  Symptoms are slightly better than a few months ago.   REVIEW OF SYSTEMS: Full 14 system review of systems performed and negative with exception of: As per HPI.  ALLERGIES: No Known Allergies  HOME MEDICATIONS: Outpatient Medications Prior to Visit  Medication Sig Dispense Refill  . aspirin 81 MG EC tablet Take 1 tablet (81 mg total) by mouth daily. 90 tablet 1  . atorvastatin (LIPITOR) 80 MG tablet TAKE 1 TABLET DAILY 90 tablet 3  . b complex vitamins tablet Take 1 tablet by mouth daily.    . calcitonin, salmon, (MIACALCIN/FORTICAL) 200 UNIT/ACT nasal spray PLACE 1 SPRAY INTO ALTERNATE NOSTRILS DAILY. 3.7 mL 4  . Carboxymeth-Glycerin-Polysorb (REFRESH OPTIVE ADVANCED OP) Apply 1 drop to eye 2 (two) times daily.    . Cetirizine HCl (ZYRTEC ALLERGY) 10 MG CAPS Take 1 capsule (10 mg total) by mouth as needed. 90 capsule 3  . gabapentin (NEURONTIN) 300 MG capsule TAKE 2 CAPSULES 3 TIMES A  DAY 540 capsule 0  .  glucosamine-chondroitin 500-400 MG tablet Take 1 tablet by mouth 3 (three) times daily.    Marland Kitchen ketotifen (ZADITOR) 0.025 % ophthalmic solution Place 1 drop into both eyes 2 (two) times daily.    . Melatonin 3 MG TABS Take by mouth.    . Multiple Vitamin (MULTIVITAMIN) tablet Take 1 tablet by mouth daily.    . naproxen (NAPROSYN) 250 MG tablet Take by mouth 2 (two) times daily with a meal.    . omeprazole (PRILOSEC) 20 MG capsule Take 1 capsule (20 mg total) by mouth daily. 90 capsule 3  . Probiotic Product (PROBIOTIC PO) Take by mouth.    . traMADol (ULTRAM) 50 MG tablet Take by mouth every 6 (six) hours as needed.    . predniSONE (DELTASONE) 10 MG tablet 3 tabs by mouth per day for 3 days,2tabs per day for 3 days,1tab per day for 3 days 18 tablet 0  . promethazine-dextromethorphan (PROMETHAZINE-DM) 6.25-15 MG/5ML syrup Take 5 mLs by mouth 4 (four) times daily as needed for cough. 118 mL 1   No facility-administered medications prior to visit.    PAST MEDICAL HISTORY: Past Medical History:  Diagnosis Date  . Allergic rhinitis   . Allergy   . GERD (gastroesophageal reflux disease)   . Hyperlipidemia   . Sciatica of right side    recurrent  . Sleep apnea    pt denies  . 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 Squamous cell carcinoma in situ (SCCIS) 02/01/2012   Left Sideburn (tx p bx)  . Squamous cell carcinoma in situ (SCCIS) 04/05/2016   Left Forearm(Cx3,5FU), Left Sideburn Sup(Cx3,5FU), and Left Sideburn Inf (Cx3,5FU)  . Squamous cell carcinoma in situ (SCCIS) 05/06/2016   Left Side Neck (tx p bx)  . Squamous cell skin cancer, earlobe   . Warts, genital     PAST SURGICAL HISTORY: Past Surgical History:  Procedure Laterality Date  . COLONOSCOPY  08/12/2005  . [place] SURGERY  2004  . [place] SURGERY  05/2018   for comression fx    FAMILY HISTORY: Family History  Problem Relation Age of Onset  . Hyperlipidemia  Father   . Hyperlipidemia Brother   . Cancer Mother        lung    SOCIAL HISTORY: Social History   Socioeconomic History  . Marital status: Single    Spouse name: Not on file  . Number of children: 0  . Years of education: Not on file  . Highest education level: Bachelor's degree (e.g., BA, AB, BS)  Occupational History  . Occupation: insurance    Comment: retired  Tobacco Use  . Smoking status: Current Every Day Smoker    Packs/day: 0.25    Types: Cigarettes  . Smokeless tobacco: Never Used  Substance and Sexual Activity  . Alcohol use: Yes    Alcohol/week: 10.0 standard drinks    Types: 10 Standard drinks or equivalent per week    Comment: wine  . Drug use: No  . Sexual activity: Yes  Other Topics Concern  . Not on file  Social History Narrative   Lives with sig other   Caffeine- sodas 2 daily    Social Determinants of Health   Financial Resource Strain:   . Difficulty of Paying Living Expenses: Not on file  Food Insecurity:   . Worried About Charity fundraiser in the Last Year: Not on file  . Ran Out of Food in the Last Year: Not on file  Transportation Needs:   . Lack of Transportation (Medical): Not on file  . Lack of Transportation (Non-Medical): Not on file  Physical Activity: Inactive  . Days of Exercise per Week: 0 days  . Minutes of Exercise per Session: 0 min  Stress:   . Feeling of Stress : Not on file  Social Connections:   . Frequency of Communication with Friends and Family: Not on file  . Frequency of Social Gatherings with Friends and Family: Not on file  . Attends Religious Services: Not on file  . Active Member of Clubs or Organizations: Not on file  . Attends Archivist Meetings: Not on file  . Marital Status: Not on file  Intimate Partner Violence:   . Fear of Current or Ex-Partner: Not on file  . Emotionally Abused: Not on file  . Physically Abused: Not on file  . Sexually Abused: Not on file     PHYSICAL  EXAM  GENERAL EXAM/CONSTITUTIONAL: Vitals:  Vitals:   09/24/19 1112  BP: (!) 142/93  Pulse: 98  Weight: 154 lb 3.2 oz (69.9 kg)  Height: 5\' 8"  (1.727 m)     Body mass index is 23.45 kg/m. Wt Readings from Last 3 Encounters:  09/24/19 154 lb 3.2 oz (69.9 kg)  05/09/19 148 lb (67.1 kg)  05/09/19 148 lb (67.1 kg)     Patient is in no distress; well developed, nourished and  groomed; neck is supple  CARDIOVASCULAR:  Examination of carotid arteries is normal; no carotid bruits  Regular rate and rhythm, no murmurs  Examination of peripheral vascular system by observation and palpation is normal  EYES:  Ophthalmoscopic exam of optic discs and posterior segments is normal; no papilledema or hemorrhages  No exam data present  MUSCULOSKELETAL:  Gait, strength, tone, movements noted in Neurologic exam below  NEUROLOGIC: MENTAL STATUS:  No flowsheet data found.  awake, alert, oriented to person, place and time  recent and remote memory intact  normal attention and concentration  language fluent, comprehension intact, naming intact  fund of knowledge appropriate  CRANIAL NERVE:   2nd - no papilledema on fundoscopic exam  2nd, 3rd, 4th, 6th - pupils equal and reactive to light, visual fields full to confrontation, extraocular muscles intact, no nystagmus  5th - facial sensation symmetric  7th - facial strength symmetric  8th - hearing intact  9th - palate elevates symmetrically, uvula midline  11th - shoulder shrug symmetric  12th - tongue protrusion midline  MOTOR:   MILD POSTURAL TREMOR IN BUE  normal bulk and tone, full strength in the BUE, BLE  SENSORY:   normal and symmetric to light touch, temperature, vibration  COORDINATION:   finger-nose-finger, fine finger movements normal  REFLEXES:   deep tendon reflexes present and symmetric  GAIT/STATION:   narrow based gait; SHORT STEPS     DIAGNOSTIC DATA (LABS, IMAGING, TESTING) - I  reviewed patient records, labs, notes, testing and imaging myself where available.  Lab Results  Component Value Date   WBC 7.7 05/03/2018   HGB 15.8 05/03/2018   HCT 46.8 05/03/2018   MCV 102.0 (H) 05/03/2018   PLT 325.0 05/03/2018      Component Value Date/Time   NA 137 11/08/2018 1029   K 4.5 11/08/2018 1029   CL 99 11/08/2018 1029   CO2 28 11/08/2018 1029   GLUCOSE 108 (H) 11/08/2018 1029   BUN 13 11/08/2018 1029   CREATININE 0.84 11/08/2018 1029   CALCIUM 10.6 (H) 11/08/2018 1029   PROT 7.6 11/08/2018 1029   ALBUMIN 3.8 11/08/2018 1029   AST 31 11/08/2018 1029   ALT 35 11/08/2018 1029   ALKPHOS 91 11/08/2018 1029   BILITOT 0.6 11/08/2018 1029   GFRNONAA 103.95 01/13/2010 0826   GFRAA 110 10/16/2008 0821   Lab Results  Component Value Date   CHOL 163 11/08/2018   HDL 60.90 11/08/2018   LDLCALC 80 11/08/2018   LDLDIRECT 93.0 05/03/2018   TRIG 109.0 11/08/2018   CHOLHDL 3 11/08/2018   Lab Results  Component Value Date   HGBA1C 5.4 05/09/2019   No results found for: VITAMINB12 Lab Results  Component Value Date   TSH 2.63 05/03/2018    04/05/18 MRI lumbar spine [I reviewed images myself and agree with interpretation. -VRP]  1. Acute/subacute inferior endplate compression fracture at L3-4 with approximately 30% loss of height but no significant retropulsed bone. 2. Broad-based disc protrusion at L3-4 and facet hypertrophy results in mild to moderate central canal stenosis. 3. Moderate left and mild right foraminal stenosis at L3-4. 4. Mild subarticular and foraminal narrowing at L4-5 is worse on the right.    ASSESSMENT AND PLAN  74 y.o. year old male here with low back pain rating to left leg with numbness and pain, history of moderate spinal stenosis at L3-4.  Status post lumbar spine surgery in September 2019.  Dx:  1. Spinal stenosis of lumbar region with neurogenic  claudication      PLAN:  LOW BACK PAIN / LEFT LEG PAIN / NUMBNESS - continue  gabapentin 600mg  twice a day  - referral to PT - may consider follow up with neurosurgery if PT does not help  Orders Placed This Encounter  Procedures  . Ambulatory referral to Physical Therapy   Return for pending if symptoms worsen or fail to improve.    Penni Bombard, MD [id number], 0000000 AM Certified in Neurology, Neurophysiology and Neuroimaging  [facility] [address], [place], [place] [address] [phone]

## 2019-09-24 NOTE — Patient Instructions (Signed)
LOW BACK PAIN / LEFT LEG PAIN / NUMBNESS - continue gabapentin 600mg  twice a day  - referral to PT - may consider follow up with neurosurgery if PT does not help

## 2019-09-28 ENCOUNTER — Encounter: Payer: Self-pay | Admitting: Physical Therapy

## 2019-09-28 ENCOUNTER — Other Ambulatory Visit: Payer: Self-pay

## 2019-09-28 ENCOUNTER — Ambulatory Visit: Payer: Medicare Other | Attending: Diagnostic Neuroimaging | Admitting: Physical Therapy

## 2019-09-28 DIAGNOSIS — R2689 Other abnormalities of gait and mobility: Secondary | ICD-10-CM | POA: Insufficient documentation

## 2019-09-28 DIAGNOSIS — M545 Low back pain: Secondary | ICD-10-CM | POA: Diagnosis not present

## 2019-09-28 DIAGNOSIS — M6281 Muscle weakness (generalized): Secondary | ICD-10-CM | POA: Insufficient documentation

## 2019-09-28 DIAGNOSIS — G8929 Other chronic pain: Secondary | ICD-10-CM | POA: Diagnosis not present

## 2019-09-28 DIAGNOSIS — R296 Repeated falls: Secondary | ICD-10-CM | POA: Insufficient documentation

## 2019-09-28 DIAGNOSIS — R293 Abnormal posture: Secondary | ICD-10-CM | POA: Diagnosis not present

## 2019-09-28 NOTE — Therapy (Signed)
[facility] [address] [place], [place], [address] Phone: [phone]   Fax:  [phone]  Physical Therapy Evaluation  Patient Details  Name: Manuel Cruz MRN: [id number] Date of Birth: 04/30/1946 Referring Provider (PT): Penni Bombard, MD   Encounter Date: 09/28/2019  PT End of Session - 09/28/19 1416    Visit Number  1    Number of Visits  17    Date for PT Re-Evaluation  11/27/19    Authorization Type  Medicare Primary/BCBS Seconday    PT Start Time  1317    PT Stop Time  1407    PT Time Calculation (min)  50 min    Activity Tolerance  Patient tolerated treatment well    Behavior During Therapy  [facility] for tasks assessed/performed       Past Medical History:  Diagnosis Date  . Allergic rhinitis   . Allergy   . GERD (gastroesophageal reflux disease)   . Hyperlipidemia   . Sciatica of right side    recurrent  . Sleep apnea    pt denies  . 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 Squamous cell carcinoma in situ (SCCIS) 02/01/2012   Left Sideburn (tx p bx)  . Squamous cell carcinoma in situ (SCCIS) 04/05/2016   Left Forearm(Cx3,5FU), Left Sideburn Sup(Cx3,5FU), and Left Sideburn Inf (Cx3,5FU)  . Squamous cell carcinoma in situ (SCCIS) 05/06/2016   Left Side Neck (tx p bx)  . Squamous cell skin cancer, earlobe   . Warts, genital     Past Surgical History:  Procedure Laterality Date  . COLONOSCOPY  08/12/2005  . [place] SURGERY  2004  . [place] SURGERY  05/2018   for comression fx    There were no vitals filed for this visit.   Subjective Assessment - 09/28/19 1325    Subjective  In 2004 had a lumbar disc surgery. Had R sided LBP w/sciatica the improved with surgery. Onset of LBP started July 2019 when helping someone off the floor that weighed heavier than him which resulted in a compression fx. He had an MRI that revealed degenerative  changes and spinal stenosis. In September 2019 he had lumbar disc surgery that had mild benefit. Symptoms have increased over last few months in L hip, knee, and foot that is worse when he stands up or walks for prolonged periods of time. Denies N&T. Denies bowel & bladder changes.    Pertinent History  HLD, sleep apnea, squamous cell skin CA, lumbar disc surgery (2004, 2019)    Limitations  Walking;Standing    How long can you walk comfortably?  ~1 block    Patient Stated Goals  "get back to normal", walk without pain, throw a football out in the yard    Currently in Pain?  Yes    Pain Score  3    in the past week worst is 7-8/10   Pain Location  Leg    Pain Orientation  Left   from hip > groin > knee > foot   Pain Descriptors / Indicators  Burning;Aching;Sharp    Pain Type  Chronic pain;Neuropathic pain    Pain Radiating Towards  L hip > groin > knee > foot    Pain Onset  More than a month ago    Pain Frequency  Intermittent    Aggravating Factors   walking, standing    Pain Relieving Factors  sitting,  rest  (pain back to baseline within 5 minutes)         [facility] PT Assessment - 09/28/19 1331      Assessment   Medical Diagnosis  Spinal stenosis of lumbar region with neurogenic claudication    Referring Provider (PT)  Penni Bombard, MD    Onset Date/Surgical Date  09/24/19   referral date      Precautions   Precautions  None      Restrictions   Weight Bearing Restrictions  No      Balance Screen   Has the patient fallen in the past 6 months  Yes    How many times?  2-3   4-5 over last year, "LLE gives out"   Has the patient had a decrease in activity level because of a fear of falling?   Yes    Is the patient reluctant to leave their home because of a fear of falling?   No      Home Environment   Living Environment  Private residence    Type of [place] to enter    Entrance Stairs-Number of Steps  2-3    Entrance Stairs-Rails  Can reach  both      Prior Function   Level of Independence  Independent    Vocation  Retired    Leisure  throw a football, go for walks      Cognition   Overall Cognitive Status  Within Functional Limits for tasks assessed      Sensation   Light Touch  Appears Intact    Additional Comments  slight inc sensation L L3 & S2      Posture/Postural Control   Posture/Postural Control  Postural limitations    Postural Limitations  Rounded Shoulders;Forward head    Posture Comments  Sitting with significant R wt shift/lean      ROM / Strength   AROM / PROM / Strength  Strength;PROM      PROM   Overall PROM   Deficits    Overall PROM Comments  limited hip IR bilaterally       Strength   Overall Strength  Deficits    Overall Strength Comments  Bilateral hip abduction and adduction grossly 4/5    Strength Assessment Site  Hip;Knee;Ankle    Right/Left Hip  Right;Left    Right Hip Flexion  3+/5    Left Hip Flexion  3+/5    Right/Left Knee  Right;Left    Right Knee Flexion  4-/5    Right Knee Extension  4+/5    Left Knee Flexion  4-/5    Left Knee Extension  4+/5    Right/Left Ankle  Right;Left    Right Ankle Dorsiflexion  4-/5    Left Ankle Dorsiflexion  4-/5      Flexibility   Soft Tissue Assessment /Muscle Length  yes    Hamstrings  R passive SLR = 28*; L passive SLR = 24*      Palpation   Palpation comment  N&T/nerve pain with palpation along posterior tibial nerve       Special Tests    Special Tests  Lumbar    Other special tests  (-) clonus bilaterally     Lumbar Tests  Slump Test;Straight Leg Raise      Slump test   Findings  Negative   bilaterally   Comment  in modified supine position with passive SLR/DF  Straight Leg Raise   Findings  Negative   bilaterally    Comment  Significant bilateral HS tightness       Ambulation/Gait   Ambulation/Gait  Yes    Ambulation/Gait Assistance  6: Modified independent (Device/Increase time)    Assistive device  None    Gait  Pattern  Step-through pattern;Decreased arm swing - right;Decreased arm swing - left;Decreased hip/knee flexion - right;Decreased hip/knee flexion - left;Decreased dorsiflexion - right;Decreased dorsiflexion - left;Decreased trunk rotation;Trunk flexed    Ambulation Surface  Indoor;Level      Standardized Balance Assessment   Standardized Balance Assessment  Five Times Sit to Stand    Five times sit to stand comments   21.34s                Objective measurements completed on examination: See above findings.      [facility] Adult PT Treatment/Exercise - 09/28/19 1331      Therapeutic Activites    Therapeutic Activities  Other Therapeutic Activities    Other Therapeutic Activities  Therapist educated pt on pain neuroscience. Based on pt's subjective history -  recent spine surgery in 2019 may have heightened the sympathetic nervous system making LE nerve irritable.  When the CNS is heightened for a prolonged period of time and unable to deescalate, it can effect the entire body. Educated pt on the cycle of how pain tends to discourage activity however weight bearing activity and walking is important to reduce osteoporosis. Discussed potential to begin a walking program at local [place] park.              PT Education - 09/28/19 1445    Education Details  PT role, exam findings, POC; see TA    Person(s) Educated  Patient    Methods  Explanation    Comprehension  Verbalized understanding       PT Short Term Goals - 09/28/19 1417      PT SHORT TERM GOAL #1   Title  Patient will be independent with initial LE strengthening/balance HEP. (all STGs target date 10/28/2019)    Time  4    Period  Weeks    Status  New    Target Date  10/28/19      PT SHORT TERM GOAL #2   Title  Patient will decrease 5xSTS by at least 3 seconds in order to demosntrate clinically significant improvement in LE strength.    Time  4    Period  Weeks    Status  New    Target Date  10/28/19      PT  SHORT TERM GOAL #3   Title  Patient reports decrease in worst pain (8/10) by 2 points on the NPRS for improved pain and activity tolerance.    Time  4    Period  Weeks    Status  New    Target Date  10/28/19      PT SHORT TERM GOAL #4   Title  Patient will demosntrate midline resting/sitting posture to indicate dec avoidance of L wt shift.    Time  4    Period  Weeks    Status  New    Target Date  10/28/19      PT SHORT TERM GOAL #5   Title  Patient tolerates ambulation for 5 minutes over various terrain (indoor/outdoor)  without increase in pain for improved activity tolerance.    Time  4    Period  Weeks  Status  New    Target Date  10/28/19        PT Long Term Goals - 09/28/19 1421      PT LONG TERM GOAL #1   Title  Patient is independent with advanced HEP. (all LTGs target date: 11/27/2019)    Time  8    Period  Weeks    Status  New    Target Date  11/27/19      PT LONG TERM GOAL #2   Title  Patient will decrease 5xSTS <15 seconds to demonstrate improvement in functional strength.    Time  8    Period  Weeks    Status  New    Target Date  11/27/19      PT LONG TERM GOAL #3   Title  Patient tolerates 10 minutes of gait at community park without increase in pain for improved activity tolerance and community involvement.    Time  8    Period  Weeks    Status  New    Target Date  11/27/19      PT LONG TERM GOAL #4   Title  Patient demosntrates global bilateral LE strength 4/5 to demonstrate improvement in strength and function.    Time  8    Period  Weeks    Status  New    Target Date  11/27/19             Plan - 09/28/19 1412    Clinical Impression Statement  Patient is a 74 yo male presenting to [facility] for spinal stenosis of lumbar region with neurogenic claudication referred by Penni Bombard, MD 09/24/2019. His PMH is significant for: HLD, sleep apnea, squamous cell skin CA, lumbar disc surgery (2004, 2019). PT examination revealed deficits in  muscle length, strength, and gait abnormality. SLR & modified slump test were negative bilaterally. Pt demonstrated significant hamstring muscle restriction with R: 28* and L: 24*. MMT revealed significantly weak hip flexors, and weakness of knee flexors and ankle DFs as noted during ambulation as well. Sensation to light touch was intact throughout - slight increase on LLE in L3 and S2 distribution. Pt's sitting posture is significantly wt shifted to the R. Gave pain neuroscience education and discussed benefits of activity. Pt may benefit from skilled therapy services to address functional limitations.    Personal Factors and Comorbidities  Comorbidity 3+;Past/Current Experience;Fitness;Time since onset of injury/illness/exacerbation    Comorbidities  HLD, sleep apnea, squamous cell skin CA, lumbar disc surgery (2004, 2019) due to compression fracture    Examination-Activity Limitations  Stairs;Stand;Locomotion Level    Examination-Participation Restrictions  Community Activity    Stability/Clinical Decision Making  Evolving/Moderate complexity    Clinical Decision Making  Moderate    Rehab Potential  Good    PT Frequency  2x / week    PT Duration  8 weeks    PT Treatment/Interventions  ADLs/Self Care Home Management;Gait training;Neuromuscular re-education;Stair training;Functional mobility training;Therapeutic activities;Therapeutic exercise;Balance training;Patient/family education;Aquatic Therapy;Cryotherapy;Moist Heat;Electrical Stimulation;DME Instruction;Orthotic Fit/Training;Manual techniques;Passive range of motion;Dry needling;Taping    PT Next Visit Plan  (Avoid flexion with rotation of spine due to h/o compression fracture and osteopenia)   Continue assessment with nerve palpation and neurodynamics to see if pt will benefit from sliders, 2 minute walk test, initiate LE strengthening/stretching - especially hamstring, initiate HEP, walking program    Consulted and Agree with Plan of Care   Patient       Patient will benefit from skilled therapeutic  intervention in order to improve the following deficits and impairments:  Abnormal gait, Decreased strength, Impaired sensation, Decreased balance, Difficulty walking, Decreased range of motion, Decreased activity tolerance, Postural dysfunction, Pain  Visit Diagnosis: Chronic left-sided low back pain, unspecified whether sciatica present  Muscle weakness (generalized)  Abnormal posture  Other abnormalities of gait and mobility     Problem List Patient Active Problem List   Diagnosis Date Noted  . Cough 11/28/2018  . Osteoporosis with pathological fracture 04/27/2018  . Compression fracture of L3 lumbar vertebra, closed, initial encounter ([facility]) 04/05/2018  . Low back pain 03/25/2018  . Hyperglycemia 04/26/2017  . Depression 04/12/2014  . Smoker 04/12/2014  . Right sided sciatica 03/15/2013  . Habitual alcohol use 03/15/2013  . Traveling 03/15/2013  . Preventative health care 03/10/2011  . OBSTRUCTIVE SLEEP APNEA 06/04/2010  . Hyperlipidemia 10/24/2007  . Allergic rhinitis 10/24/2007    Juliann Pulse SPT 09/28/2019, 2:46 PM  [facility] [address] [place], [place], [address] Phone: [phone]   Fax:  [phone]  Name: Manuel Cruz MRN: [id number] Date of Birth: Aug 01, 1946

## 2019-10-04 ENCOUNTER — Ambulatory Visit: Payer: Medicare Other | Admitting: Physical Therapy

## 2019-10-04 ENCOUNTER — Encounter: Payer: Self-pay | Admitting: Physical Therapy

## 2019-10-04 ENCOUNTER — Other Ambulatory Visit: Payer: Self-pay

## 2019-10-04 ENCOUNTER — Other Ambulatory Visit: Payer: Self-pay | Admitting: Internal Medicine

## 2019-10-04 DIAGNOSIS — M6281 Muscle weakness (generalized): Secondary | ICD-10-CM | POA: Diagnosis not present

## 2019-10-04 DIAGNOSIS — R293 Abnormal posture: Secondary | ICD-10-CM | POA: Diagnosis not present

## 2019-10-04 DIAGNOSIS — M545 Low back pain, unspecified: Secondary | ICD-10-CM

## 2019-10-04 DIAGNOSIS — R296 Repeated falls: Secondary | ICD-10-CM

## 2019-10-04 DIAGNOSIS — R2689 Other abnormalities of gait and mobility: Secondary | ICD-10-CM | POA: Diagnosis not present

## 2019-10-04 DIAGNOSIS — G8929 Other chronic pain: Secondary | ICD-10-CM | POA: Diagnosis not present

## 2019-10-04 NOTE — Patient Instructions (Signed)
Walking: Walking at the park for 6-8 minutes at a comfortable pace.  Pain should not go above 4-5 out of 10.     Access Code: [email or web address]  URL: [email or web address]  Date: 10/04/2019  Prepared by: Misty Stanley   Exercises Seated Hamstring Stretch with Strap - 2 sets - 20 second hold - 1x daily - 7x weekly

## 2019-10-04 NOTE — Therapy (Signed)
[facility] [address] [place], [place], [address] Phone: [phone]   Fax:  [phone]  Physical Therapy Treatment  Patient Details  Name: Manuel Cruz MRN: [id number] Date of Birth: 01-Nov-1945 Referring Provider (PT): Penni Bombard, MD   Encounter Date: 10/04/2019  PT End of Session - 10/04/19 1036    Visit Number  2    Number of Visits  17    Date for PT Re-Evaluation  11/27/19    Authorization Type  Medicare Primary/BCBS Seconday    PT Start Time  0930    PT Stop Time  1020    PT Time Calculation (min)  50 min    Activity Tolerance  Patient tolerated treatment well    Behavior During Therapy  [facility] for tasks assessed/performed       Past Medical History:  Diagnosis Date  . Allergic rhinitis   . Allergy   . GERD (gastroesophageal reflux disease)   . Hyperlipidemia   . Sciatica of right side    recurrent  . Sleep apnea    pt denies  . 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 Squamous cell carcinoma in situ (SCCIS) 02/01/2012   Left Sideburn (tx p bx)  . Squamous cell carcinoma in situ (SCCIS) 04/05/2016   Left Forearm(Cx3,5FU), Left Sideburn Sup(Cx3,5FU), and Left Sideburn Inf (Cx3,5FU)  . Squamous cell carcinoma in situ (SCCIS) 05/06/2016   Left Side Neck (tx p bx)  . Squamous cell skin cancer, earlobe   . Warts, genital     Past Surgical History:  Procedure Laterality Date  . COLONOSCOPY  08/12/2005  . [place] SURGERY  2004  . [place] SURGERY  05/2018   for comression fx    There were no vitals filed for this visit.  Subjective Assessment - 10/04/19 0934    Subjective  No negative reactions to evaluation, has been feeling about the same since the evaluation.  Still using Rebuilder for pain; continues to feel better.  No questions from evaluation.    Pertinent History  HLD, sleep apnea, squamous cell skin CA, lumbar disc surgery  (2004, 2019)    Limitations  Walking;Standing    How long can you walk comfortably?  ~1 block    Patient Stated Goals  "get back to normal", walk without pain, throw a football out in the yard    Currently in Pain?  Yes    Pain Score  3     Pain Location  Leg    Pain Orientation  Left    Pain Descriptors / Indicators  Radiating    Pain Onset  More than a month ago         [facility] PT Assessment - 10/04/19 0936      Palpation   Palpation comment  Nerve palpation of lumbar and lumbarsacral plexus nn along LLE - areas of sensitivity/tenderness but no neuropathic symptoms. Reported some superior ankle and medial thigh burning after neural palpation but resolved quickly.        Special Tests   Other special tests  Neural Mobility of the following lumbar and lumbosacral nerves of LLE: femoral nn and lateral femoral cutaneous nn were negative for neuropathic symptoms - just isolated tightness/sensitivity at posterior knee that did not change with structural differentiation.  Obturator nn pt did report neuropathic symptoms that were relieved with neck extension.  For sural and tibial nn in supine: proximal and  distal tests revealed tightness at knee but no neuropathic symptoms, no change with structural differentiation.  Peronal nerve was + for neuropathic symptoms that intensified with structural differentiation.      6 Minute Walk- Baseline   6 Minute Walk- Baseline  yes    BP (mmHg)  142/85    HR (bpm)  75    02 Sat (%RA)  98 %    Modified Borg Scale for Dyspnea  0- Nothing at all    Perceived Rate of Exertion (Borg)  6-      6 Minute walk- Post Test   6 Minute Walk Post Test  yes    BP (mmHg)  138/88    HR (bpm)  84    02 Sat (%RA)  100 %    Modified Borg Scale for Dyspnea  1- Very mild shortness of breath    Perceived Rate of Exertion (Borg)  11- Fairly light      6 minute walk test results    Aerobic Endurance Distance Walked  1058    Endurance additional comments  pain began at  3/10; after 6 minutes pain increased at L knee and L buttocks 4/10.                   [facility] Adult PT Treatment/Exercise - 10/04/19 0001      Exercises   Exercises  Knee/Hip      Knee/Hip Exercises: Stretches   Active Hamstring Stretch  Right;Left;1 rep;20 seconds    Active Hamstring Stretch Limitations  seated with strap             PT Education - 10/04/19 1035    Education Details  results of neural testing and 6 min walk test; initiate walking program, hamstring stretching    Person(s) Educated  Patient    Methods  Explanation;Demonstration;Handout    Comprehension  Verbalized understanding;Returned demonstration       PT Short Term Goals - 09/28/19 1417      PT SHORT TERM GOAL #1   Title  Patient will be independent with initial LE strengthening/balance HEP. (all STGs target date 10/28/2019)    Time  4    Period  Weeks    Status  New    Target Date  10/28/19      PT SHORT TERM GOAL #2   Title  Patient will decrease 5xSTS by at least 3 seconds in order to demosntrate clinically significant improvement in LE strength.    Time  4    Period  Weeks    Status  New    Target Date  10/28/19      PT SHORT TERM GOAL #3   Title  Patient reports decrease in worst pain (8/10) by 2 points on the NPRS for improved pain and activity tolerance.    Time  4    Period  Weeks    Status  New    Target Date  10/28/19      PT SHORT TERM GOAL #4   Title  Patient will demosntrate midline resting/sitting posture to indicate dec avoidance of L wt shift.    Time  4    Period  Weeks    Status  New    Target Date  10/28/19      PT SHORT TERM GOAL #5   Title  Patient tolerates ambulation for 5 minutes over various terrain (indoor/outdoor)  without increase in pain for improved activity tolerance.    Time  4  Period  Weeks    Status  New    Target Date  10/28/19        PT Long Term Goals - 09/28/19 1421      PT LONG TERM GOAL #1   Title  Patient is independent  with advanced HEP. (all LTGs target date: 11/27/2019)    Time  8    Period  Weeks    Status  New    Target Date  11/27/19      PT LONG TERM GOAL #2   Title  Patient will decrease 5xSTS <15 seconds to demonstrate improvement in functional strength.    Time  8    Period  Weeks    Status  New    Target Date  11/27/19      PT LONG TERM GOAL #3   Title  Patient tolerates 10 minutes of gait at community park without increase in pain for improved activity tolerance and community involvement.    Time  8    Period  Weeks    Status  New    Target Date  11/27/19      PT LONG TERM GOAL #4   Title  Patient demosntrates global bilateral LE strength 4/5 to demonstrate improvement in strength and function.    Time  8    Period  Weeks    Status  New    Target Date  11/27/19            Plan - 10/04/19 1036    Clinical Impression Statement  Continued neural irritability testing with nerve palpation and neural mobility testing.  No significant findings with palpation but neuropathic symptoms noted with mobility of peroneal nn and obturator nn on LLE.  Also performed 6 minute test of endurance and to assess neurogenic claudication; mild claudication at end of 6 minutes resolved quickly.  Initiated walking program and hamstring mobility stretching for home.  Will continue to add to HEP next session.    Personal Factors and Comorbidities  Comorbidity 3+;Past/Current Experience;Fitness;Time since onset of injury/illness/exacerbation    Comorbidities  HLD, sleep apnea, squamous cell skin CA, lumbar disc surgery (2004, 2019) due to compression fracture    Examination-Activity Limitations  Stairs;Stand;Locomotion Level    Examination-Participation Restrictions  Community Activity    Stability/Clinical Decision Making  Evolving/Moderate complexity    Rehab Potential  Good    PT Frequency  2x / week    PT Duration  8 weeks    PT Treatment/Interventions  ADLs/Self Care Home Management;Gait  training;Neuromuscular re-education;Stair training;Functional mobility training;Therapeutic activities;Therapeutic exercise;Balance training;Patient/family education;Aquatic Therapy;Cryotherapy;Moist Heat;Electrical Stimulation;DME Instruction;Orthotic Fit/Training;Manual techniques;Passive range of motion;Dry needling;Taping    PT Next Visit Plan  (Avoid flexion with rotation of spine due to h/o compression fracture and osteopenia)   Progress walking program?  add to HEP.  Peroneal and obturator sliders.  DF strengthening due to L shuffle.  (hip flex > hip ADD > flex/add/IR > log roll LE Into IR)  Single KTC, double KTC, lumbar rotation, piriformis, leg across body.    Recommended Other Services  Aquatic    Consulted and Agree with Plan of Care  Patient       Patient will benefit from skilled therapeutic intervention in order to improve the following deficits and impairments:  Abnormal gait, Decreased strength, Impaired sensation, Decreased balance, Difficulty walking, Decreased range of motion, Decreased activity tolerance, Postural dysfunction, Pain, Impaired flexibility  Visit Diagnosis: Chronic left-sided low back pain, unspecified whether sciatica present  Muscle weakness (  generalized)  Abnormal posture  Other abnormalities of gait and mobility  Repeated falls     Problem List Patient Active Problem List   Diagnosis Date Noted  . Cough 11/28/2018  . Osteoporosis with pathological fracture 04/27/2018  . Compression fracture of L3 lumbar vertebra, closed, initial encounter ([facility]) 04/05/2018  . Low back pain 03/25/2018  . Hyperglycemia 04/26/2017  . Depression 04/12/2014  . Smoker 04/12/2014  . Right sided sciatica 03/15/2013  . Habitual alcohol use 03/15/2013  . Traveling 03/15/2013  . Preventative health care 03/10/2011  . OBSTRUCTIVE SLEEP APNEA 06/04/2010  . Hyperlipidemia 10/24/2007  . Allergic rhinitis 10/24/2007    Rico Junker, PT, DPT 10/04/19    10:43  AM    [facility] [address] [place], [place], [address] Phone: [phone]   Fax:  [phone]  Name: Manuel Cruz MRN: [id number] Date of Birth: Mar 13, 1946

## 2019-10-10 ENCOUNTER — Other Ambulatory Visit: Payer: Self-pay

## 2019-10-10 ENCOUNTER — Encounter: Payer: Self-pay | Admitting: Physical Therapy

## 2019-10-10 ENCOUNTER — Ambulatory Visit: Payer: Medicare Other | Admitting: Physical Therapy

## 2019-10-10 DIAGNOSIS — M6281 Muscle weakness (generalized): Secondary | ICD-10-CM | POA: Diagnosis not present

## 2019-10-10 DIAGNOSIS — G8929 Other chronic pain: Secondary | ICD-10-CM | POA: Diagnosis not present

## 2019-10-10 DIAGNOSIS — R293 Abnormal posture: Secondary | ICD-10-CM

## 2019-10-10 DIAGNOSIS — M545 Low back pain: Secondary | ICD-10-CM | POA: Diagnosis not present

## 2019-10-10 DIAGNOSIS — R296 Repeated falls: Secondary | ICD-10-CM | POA: Diagnosis not present

## 2019-10-10 DIAGNOSIS — R2689 Other abnormalities of gait and mobility: Secondary | ICD-10-CM

## 2019-10-10 NOTE — Patient Instructions (Addendum)
Walking: Walking at the park for 6-8 minutes at a comfortable pace.  Pain should not go above 4-5 out of 10.     Access Code: [email or web address]  URL: [email or web address]  Date: 10/10/2019  Prepared by: Misty Stanley   Exercises Seated Hamstring Stretch with Strap - 2 sets - 20 second hold - 1x daily - 7x weekly Hooklying Single Knee to Chest - 4 reps - 10 second hold - 1x daily - 7x weekly Supine Double Knee to Chest - 2 reps - 10 second hold - 1x daily - 7x weekly Supine Piriformis Stretch with Foot on Ground - 4 reps - 10 second hold hold - 1x daily - 7x weekly Supine Lower Trunk Rotation - 10 reps - 1x daily - 7x weekly Supine ITB Stretch with Strap - 2 reps - 10 second hold - 1x daily - 7x weekly Seated Slump Nerve Glide - 10 reps - 1 sets - 1x daily - 7x weekly

## 2019-10-10 NOTE — Therapy (Signed)
[facility] [address] [place], [place], [address] Phone: [phone]   Fax:  [phone]  Physical Therapy Treatment  Patient Details  Name: Manuel Cruz MRN: [id number] Date of Birth: September 15, 1945 Referring Provider (PT): Penni Bombard, MD   Encounter Date: 10/10/2019  PT End of Session - 10/10/19 1144    Visit Number  3    Number of Visits  17    Date for PT Re-Evaluation  11/27/19    Authorization Type  Medicare Primary/BCBS Seconday    PT Start Time  0806    PT Stop Time  0850    PT Time Calculation (min)  44 min    Activity Tolerance  Patient tolerated treatment well    Behavior During Therapy  [facility] for tasks assessed/performed       Past Medical History:  Diagnosis Date  . Allergic rhinitis   . Allergy   . GERD (gastroesophageal reflux disease)   . Hyperlipidemia   . Sciatica of right side    recurrent  . Sleep apnea    pt denies  . 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 Squamous cell carcinoma in situ (SCCIS) 02/01/2012   Left Sideburn (tx p bx)  . Squamous cell carcinoma in situ (SCCIS) 04/05/2016   Left Forearm(Cx3,5FU), Left Sideburn Sup(Cx3,5FU), and Left Sideburn Inf (Cx3,5FU)  . Squamous cell carcinoma in situ (SCCIS) 05/06/2016   Left Side Neck (tx p bx)  . Squamous cell skin cancer, earlobe   . Warts, genital     Past Surgical History:  Procedure Laterality Date  . COLONOSCOPY  08/12/2005  . [place] SURGERY  2004  . [place] SURGERY  05/2018   for comression fx    There were no vitals filed for this visit.  Subjective Assessment - 10/10/19 0809    Subjective  Doing well, no adverse reactions after last session.  Has been doing the hamstring stretch.  Intermittent numbness and pins and needles down L lower leg.  Doing Rebuilder 1x/day.    Pertinent History  HLD, sleep apnea, squamous cell skin CA, lumbar disc surgery  (2004, 2019)    Limitations  Walking;Standing    How long can you walk comfortably?  ~1 block    Patient Stated Goals  "get back to normal", walk without pain, throw a football out in the yard    Pain Onset  More than a month ago       Access Code: [email or web address]  URL: [email or web address]  Date: 10/10/2019  Prepared by: Misty Stanley   Exercises Seated Hamstring Stretch with Strap - 2 sets - 20 second hold - 1x daily - 7x weekly Hooklying Single Knee to Chest - 4 reps - 10 second hold - 1x daily - 7x weekly Supine Double Knee to Chest - 2 reps - 10 second hold - 1x daily - 7x weekly Supine Piriformis Stretch with Foot on Ground - 4 reps - 10 second hold hold - 1x daily - 7x weekly Supine Lower Trunk Rotation - 10 reps - 1x daily - 7x weekly Supine ITB Stretch with Strap - 2 reps - 10 second hold - 1x daily - 7x weekly Seated Slump Nerve Glide - 10 reps - 1 sets - 1x daily - 7x weekly    Educated pt on aquatic therapy:   Aquatic Therapy: What to Expect!  Where:  [facility]  [place], [place]  [id number] [phone]  NOTE:  You will receive an automated phone message reminding you of your appointment and it will say the appointment is at the [facility] on [address]  We are working to fix this- just know that you will meet [place] at the pool!  How to Prepare: . Please make sure you drink 8 ounces of water about one hour prior to your pool session . A caregiver MUST attend the entire session with the patient.  The caregiver will be responsible for assisting with dressing as well as any toileting needs.  If the patient will be doing a home program this should likely be the person who will assist as well.  . Patients must wear either their street shoes or pool shoes until they are ready to enter the pool with the therapist.  Patients must also wear either street shoes or pool shoes once exiting the pool to walk to the locker room.  This will helps  [place] prevent slips and falls.  . Please arrive 15 minutes early to prepare for your pool therapy session . Sign in at the front desk on the clipboard marked for [place] . You may use the locker rooms on your right and then enter directly into the recreation pool (NOT the competition pool) . Please make sure to attend to any toileting needs prior to entering the pool . Please be dressed in your swim suit and on the pool deck at least 5 minutes before your appointment . Once on the pool deck your therapist will ask you to sign the Patient  Consent and Assignment of Benefits form . Your therapist may take your blood pressure prior to, during and after your session if indicated  About the pool  and parking: 1. Entering the pool Your therapist will assist you; there are multiple ways to enter including stairs with railings, a walk in ramp, a roll in chair and a mechanical lift. Your therapist will determine the most appropriate way for you. 2. Water temperature is usually between 86-87 degrees 3. There may be other swimmers in the pool at the same time 4. Parking is free: if you arrive and there is a parking attendant please inform them you are there for therapy and do not pay to park. Handicapped parking is located at the front entrance.  Contact Info:     Appointments: [facility]  All sessions are 45 minutes   [place] [address]     Please call the [facility] if   [place], [place]   [phone]    you need to cancel or reschedule an appointment.  [phone]         PT Education - 10/10/19 1141    Education Details  reviewed hamstring stretch, added to HEP, aquatic therapy    Person(s) Educated  Patient    Methods  Explanation;Demonstration;Handout    Comprehension  Verbalized understanding;Returned demonstration       PT Short Term Goals - 09/28/19 1417      PT SHORT TERM GOAL #1   Title  Patient will be independent with initial LE  strengthening/balance HEP. (all STGs target date 10/28/2019)    Time  4    Period  Weeks    Status  New    Target Date  10/28/19      PT SHORT TERM GOAL #2   Title  Patient will decrease 5xSTS by at least 3 seconds in order  to demosntrate clinically significant improvement in LE strength.    Time  4    Period  Weeks    Status  New    Target Date  10/28/19      PT SHORT TERM GOAL #3   Title  Patient reports decrease in worst pain (8/10) by 2 points on the NPRS for improved pain and activity tolerance.    Time  4    Period  Weeks    Status  New    Target Date  10/28/19      PT SHORT TERM GOAL #4   Title  Patient will demosntrate midline resting/sitting posture to indicate dec avoidance of L wt shift.    Time  4    Period  Weeks    Status  New    Target Date  10/28/19      PT SHORT TERM GOAL #5   Title  Patient tolerates ambulation for 5 minutes over various terrain (indoor/outdoor)  without increase in pain for improved activity tolerance.    Time  4    Period  Weeks    Status  New    Target Date  10/28/19        PT Long Term Goals - 09/28/19 1421      PT LONG TERM GOAL #1   Title  Patient is independent with advanced HEP. (all LTGs target date: 11/27/2019)    Time  8    Period  Weeks    Status  New    Target Date  11/27/19      PT LONG TERM GOAL #2   Title  Patient will decrease 5xSTS <15 seconds to demonstrate improvement in functional strength.    Time  8    Period  Weeks    Status  New    Target Date  11/27/19      PT LONG TERM GOAL #3   Title  Patient tolerates 10 minutes of gait at community park without increase in pain for improved activity tolerance and community involvement.    Time  8    Period  Weeks    Status  New    Target Date  11/27/19      PT LONG TERM GOAL #4   Title  Patient demosntrates global bilateral LE strength 4/5 to demonstrate improvement in strength and function.    Time  8    Period  Weeks    Status  New    Target Date   11/27/19            Plan - 10/10/19 1145    Clinical Impression Statement  Reviewed hamstring stretch provided last session and added further LE stretches to continue to focus on increasing ROM in lumbar spine and bilat LE for improved neural mobility.  Initiated sciatic nn sliders on R side; pt continue to report some numbness and pain in L lower leg with L sliders - for HEP will perform on R side only for now.  Pt tolerated rest of exercies well with no increase in pain; discussed aquatic therapy and pt is interested in performing 3-4 sessions in the pool.  Will coordinate with aquatic PT to set up.  Will continue to progress towards LTG.    Personal Factors and Comorbidities  Comorbidity 3+;Past/Current Experience;Fitness;Time since onset of injury/illness/exacerbation    Comorbidities  HLD, sleep apnea, squamous cell skin CA, lumbar disc surgery (2004, 2019) due to compression fracture    Examination-Activity Limitations  Stairs;Stand;Locomotion Level  Examination-Participation Restrictions  Community Activity    Stability/Clinical Decision Making  Evolving/Moderate complexity    Rehab Potential  Good    PT Frequency  2x / week    PT Duration  8 weeks    PT Treatment/Interventions  ADLs/Self Care Home Management;Gait training;Neuromuscular re-education;Stair training;Functional mobility training;Therapeutic activities;Therapeutic exercise;Balance training;Patient/family education;Aquatic Therapy;Cryotherapy;Moist Heat;Electrical Stimulation;DME Instruction;Orthotic Fit/Training;Manual techniques;Passive range of motion;Dry needling;Taping    PT Next Visit Plan  (Avoid flexion with rotation of spine due to h/o compression fracture and osteopenia)   DF strengthening due to L shuffling during gait.  Hip extension strengthening.    (review nerve sliders - do on R side first then L side).    Consulted and Agree with Plan of Care  Patient       Patient will benefit from skilled therapeutic  intervention in order to improve the following deficits and impairments:  Abnormal gait, Decreased strength, Impaired sensation, Decreased balance, Difficulty walking, Decreased range of motion, Decreased activity tolerance, Postural dysfunction, Pain, Impaired flexibility  Visit Diagnosis: Chronic left-sided low back pain, unspecified whether sciatica present  Muscle weakness (generalized)  Abnormal posture  Other abnormalities of gait and mobility  Repeated falls     Problem List Patient Active Problem List   Diagnosis Date Noted  . Cough 11/28/2018  . Osteoporosis with pathological fracture 04/27/2018  . Compression fracture of L3 lumbar vertebra, closed, initial encounter ([place]) 04/05/2018  . Low back pain 03/25/2018  . Hyperglycemia 04/26/2017  . Depression 04/12/2014  . Smoker 04/12/2014  . Right sided sciatica 03/15/2013  . Habitual alcohol use 03/15/2013  . Traveling 03/15/2013  . Preventative health care 03/10/2011  . OBSTRUCTIVE SLEEP APNEA 06/04/2010  . Hyperlipidemia 10/24/2007  . Allergic rhinitis 10/24/2007    Rico Junker, PT, DPT 10/10/19    11:51 AM    [facility] [address] [place], [place], [address] Phone: [phone]   Fax:  [phone]  Name: Manuel Cruz MRN: [id number] Date of Birth: January 06, 1946

## 2019-10-15 ENCOUNTER — Other Ambulatory Visit: Payer: Self-pay

## 2019-10-15 ENCOUNTER — Encounter: Payer: Self-pay | Admitting: Physical Therapy

## 2019-10-15 ENCOUNTER — Ambulatory Visit: Payer: Medicare Other | Attending: Diagnostic Neuroimaging | Admitting: Physical Therapy

## 2019-10-15 DIAGNOSIS — R2689 Other abnormalities of gait and mobility: Secondary | ICD-10-CM | POA: Insufficient documentation

## 2019-10-15 DIAGNOSIS — R293 Abnormal posture: Secondary | ICD-10-CM | POA: Diagnosis not present

## 2019-10-15 DIAGNOSIS — R296 Repeated falls: Secondary | ICD-10-CM | POA: Insufficient documentation

## 2019-10-15 DIAGNOSIS — G8929 Other chronic pain: Secondary | ICD-10-CM | POA: Diagnosis not present

## 2019-10-15 DIAGNOSIS — M6281 Muscle weakness (generalized): Secondary | ICD-10-CM | POA: Diagnosis not present

## 2019-10-15 DIAGNOSIS — M545 Low back pain, unspecified: Secondary | ICD-10-CM

## 2019-10-15 NOTE — Therapy (Signed)
[facility] [address] [place], [place], [address] Phone: [phone]   Fax:  [phone]  Physical Therapy Treatment  Patient Details  Name: Manuel Cruz MRN: [id number] Date of Birth: 1946-02-02 Referring Provider (PT): Penni Bombard, MD   Encounter Date: 10/15/2019  PT End of Session - 10/15/19 2053    Visit Number  4    Number of Visits  17    Date for PT Re-Evaluation  11/27/19    Authorization Type  Medicare Primary/BCBS Seconday    PT Start Time  1515    PT Stop Time  1600    PT Time Calculation (min)  45 min    Equipment Utilized During Treatment  --   ankle buoyancy cuffs   Activity Tolerance  Patient tolerated treatment well    Behavior During Therapy  [facility] for tasks assessed/performed       Past Medical History:  Diagnosis Date  . Allergic rhinitis   . Allergy   . GERD (gastroesophageal reflux disease)   . Hyperlipidemia   . Sciatica of right side    recurrent  . Sleep apnea    pt denies  . 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 Squamous cell carcinoma in situ (SCCIS) 02/01/2012   Left Sideburn (tx p bx)  . Squamous cell carcinoma in situ (SCCIS) 04/05/2016   Left Forearm(Cx3,5FU), Left Sideburn Sup(Cx3,5FU), and Left Sideburn Inf (Cx3,5FU)  . Squamous cell carcinoma in situ (SCCIS) 05/06/2016   Left Side Neck (tx p bx)  . Squamous cell skin cancer, earlobe   . Warts, genital     Past Surgical History:  Procedure Laterality Date  . COLONOSCOPY  08/12/2005  . [place] SURGERY  2004  . [place] SURGERY  05/2018   for comression fx    There were no vitals filed for this visit.  Subjective Assessment - 10/15/19 2050    Subjective  Denies any falls or changes.  Feels like his back is getting better.    Pertinent History  HLD, sleep apnea, squamous cell skin CA, lumbar disc surgery (2004, 2019)    Limitations   Walking;Standing    How long can you walk comfortably?  ~1 block    Patient Stated Goals  "get back to normal", walk without pain, throw a football out in the yard    Currently in Pain?  Yes    Pain Score  1     Pain Location  Leg    Pain Orientation  Left    Pain Descriptors / Indicators  Radiating    Pain Type  Chronic pain    Pain Onset  More than a month ago       Aquatic therapy at [facility] - pool temp 86.7degrees  Patient seen for aquatic therapy today. Treatment took place in water 3.5-4 feet deep depending upon activity. Pt entered and exited the poolvia step negotiation with use of handrails modified independently.  Marland Kitchen  Pt performed water walking 68m across pool forwards x 2 repsWalked 26mx 2 repswith hands under water encouraging arm swing and marching; Sidesteppingx 82m both directions then 49m both directions with squatand shoulder abd/add.  Backwards walking 44m x 2 reps with unsteadiness but using UE's to assist with balance.   Performed bil LE hip abduction, hip flexion, hamstring curland hip extension x 10 reps alternating LE's. Needing cues for technique.  Requiring1 UE support for balance. Stepping  single leg forward >starting position using pool tile for visual cue x 15 reps each side for balance. Squats x 10with UE supportthen single leg squats x 10 with UE support. Attempted pelvic tilts with pt back against pool wall in squat position but pt unable to achieve proper recruitment even with tactile cues so transitioned to pool bench. Seated on pool bench working on anterior/posterior pelvic tilts with verbal and tactile cues x 10.  Pt having difficulty maintaining seated position due to buoyancy but improved with time.  Performed marching x 10 then LAQ x 10 alternating LE's and with buoyancy cuffs in place.  Seated on pool bench and performed sit<>stand x 10 then repeated with single leg x 10 reps each side with 1 UE support.  Performed runners stretch bil  directions x 30 seconds and feet up wall gastroc stretch x 30 seconds at beginning and end of session.  Pt performed 2 reps of forward bench at hips with legs shoulder width apart for bil hamstring stretch.  Pt requires the buoyancy and viscosity of the water for resistance and for support with balance exercises - pt able to safely perform balance activities without risk of falling; current of water provides mild perturbations for challenge with balance. Also used resistance of water for strengthening exercises.   PT Short Term Goals - 09/28/19 1417      PT SHORT TERM GOAL #1   Title  Patient will be independent with initial LE strengthening/balance HEP. (all STGs target date 10/28/2019)    Time  4    Period  Weeks    Status  New    Target Date  10/28/19      PT SHORT TERM GOAL #2   Title  Patient will decrease 5xSTS by at least 3 seconds in order to demosntrate clinically significant improvement in LE strength.    Time  4    Period  Weeks    Status  New    Target Date  10/28/19      PT SHORT TERM GOAL #3   Title  Patient reports decrease in worst pain (8/10) by 2 points on the NPRS for improved pain and activity tolerance.    Time  4    Period  Weeks    Status  New    Target Date  10/28/19      PT SHORT TERM GOAL #4   Title  Patient will demosntrate midline resting/sitting posture to indicate dec avoidance of L wt shift.    Time  4    Period  Weeks    Status  New    Target Date  10/28/19      PT SHORT TERM GOAL #5   Title  Patient tolerates ambulation for 5 minutes over various terrain (indoor/outdoor)  without increase in pain for improved activity tolerance.    Time  4    Period  Weeks    Status  New    Target Date  10/28/19        PT Long Term Goals - 09/28/19 1421      PT LONG TERM GOAL #1   Title  Patient is independent with advanced HEP. (all LTGs target date: 11/27/2019)    Time  8    Period  Weeks    Status  New    Target Date  11/27/19      PT LONG  TERM GOAL #2   Title  Patient will decrease 5xSTS <15 seconds to demonstrate improvement in  functional strength.    Time  8    Period  Weeks    Status  New    Target Date  11/27/19      PT LONG TERM GOAL #3   Title  Patient tolerates 10 minutes of gait at community park without increase in pain for improved activity tolerance and community involvement.    Time  8    Period  Weeks    Status  New    Target Date  11/27/19      PT LONG TERM GOAL #4   Title  Patient demosntrates global bilateral LE strength 4/5 to demonstrate improvement in strength and function.    Time  8    Period  Weeks    Status  New    Target Date  11/27/19            Plan - 10/15/19 2054    Clinical Impression Statement  Pt tolerated first aquatic session well today with no increase in pain/discomfort.  .  Still with bil hamstring tightness.  Continue PT per POC.    Personal Factors and Comorbidities  Comorbidity 3+;Past/Current Experience;Fitness;Time since onset of injury/illness/exacerbation    Comorbidities  HLD, sleep apnea, squamous cell skin CA, lumbar disc surgery (2004, 2019) due to compression fracture    Examination-Activity Limitations  Stairs;Stand;Locomotion Level    Examination-Participation Restrictions  Community Activity    Stability/Clinical Decision Making  Evolving/Moderate complexity    Rehab Potential  Good    PT Frequency  2x / week    PT Duration  8 weeks    PT Treatment/Interventions  ADLs/Self Care Home Management;Gait training;Neuromuscular re-education;Stair training;Functional mobility training;Therapeutic activities;Therapeutic exercise;Balance training;Patient/family education;Aquatic Therapy;Cryotherapy;Moist Heat;Electrical Stimulation;DME Instruction;Orthotic Fit/Training;Manual techniques;Passive range of motion;Dry needling;Taping    PT Next Visit Plan  (Avoid flexion with rotation of spine due to h/o compression fracture and osteopenia)   DF strengthening due to L  shuffling during gait.  Hip extension strengthening.    (review nerve sliders - do on R side first then L side).    Consulted and Agree with Plan of Care  Patient       Patient will benefit from skilled therapeutic intervention in order to improve the following deficits and impairments:  Abnormal gait, Decreased strength, Impaired sensation, Decreased balance, Difficulty walking, Decreased range of motion, Decreased activity tolerance, Postural dysfunction, Pain, Impaired flexibility  Visit Diagnosis: Chronic left-sided low back pain, unspecified whether sciatica present  Muscle weakness (generalized)  Abnormal posture  Other abnormalities of gait and mobility  Repeated falls     Problem List Patient Active Problem List   Diagnosis Date Noted  . Cough 11/28/2018  . Osteoporosis with pathological fracture 04/27/2018  . Compression fracture of L3 lumbar vertebra, closed, initial encounter ([place]) 04/05/2018  . Low back pain 03/25/2018  . Hyperglycemia 04/26/2017  . Depression 04/12/2014  . Smoker 04/12/2014  . Right sided sciatica 03/15/2013  . Habitual alcohol use 03/15/2013  . Traveling 03/15/2013  . Preventative health care 03/10/2011  . OBSTRUCTIVE SLEEP APNEA 06/04/2010  . Hyperlipidemia 10/24/2007  . Allergic rhinitis 10/24/2007    Narda Bonds, PTA [place] 10/15/19 9:19 PM Phone: [phone] Fax: [facility] [address] [place], [place], [address] Phone: [phone]   Fax:  [phone]  Name: Manuel Cruz MRN: [id number] Date of Birth: [birth year]

## 2019-10-19 ENCOUNTER — Ambulatory Visit: Payer: Medicare Other | Admitting: Physical Therapy

## 2019-10-19 ENCOUNTER — Other Ambulatory Visit: Payer: Self-pay

## 2019-10-19 DIAGNOSIS — M6281 Muscle weakness (generalized): Secondary | ICD-10-CM

## 2019-10-19 DIAGNOSIS — R296 Repeated falls: Secondary | ICD-10-CM | POA: Diagnosis not present

## 2019-10-19 DIAGNOSIS — R293 Abnormal posture: Secondary | ICD-10-CM

## 2019-10-19 DIAGNOSIS — R2689 Other abnormalities of gait and mobility: Secondary | ICD-10-CM | POA: Diagnosis not present

## 2019-10-19 DIAGNOSIS — M545 Low back pain, unspecified: Secondary | ICD-10-CM

## 2019-10-19 DIAGNOSIS — G8929 Other chronic pain: Secondary | ICD-10-CM | POA: Diagnosis not present

## 2019-10-19 NOTE — Therapy (Signed)
[facility] [address] [place], [place], [address] Phone: [phone]   Fax:  [phone]  Physical Therapy Treatment  Patient Details  Name: Manuel Cruz MRN: [id number] Date of Birth: 06-22-1946 Referring Provider (PT): Penni Bombard, MD   Encounter Date: 10/19/2019  PT End of Session - 10/19/19 1246    Visit Number  5    Number of Visits  17    Date for PT Re-Evaluation  11/27/19    Authorization Type  Medicare Primary/BCBS Seconday    PT Start Time  1145    PT Stop Time  1230    PT Time Calculation (min)  45 min    Equipment Utilized During Treatment  --   ankle buoyancy cuffs   Activity Tolerance  Patient tolerated treatment well    Behavior During Therapy  [facility] for tasks assessed/performed       Past Medical History:  Diagnosis Date  . Allergic rhinitis   . Allergy   . GERD (gastroesophageal reflux disease)   . Hyperlipidemia   . Sciatica of right side    recurrent  . Sleep apnea    pt denies  . 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 Squamous cell carcinoma in situ (SCCIS) 02/01/2012   Left Sideburn (tx p bx)  . Squamous cell carcinoma in situ (SCCIS) 04/05/2016   Left Forearm(Cx3,5FU), Left Sideburn Sup(Cx3,5FU), and Left Sideburn Inf (Cx3,5FU)  . Squamous cell carcinoma in situ (SCCIS) 05/06/2016   Left Side Neck (tx p bx)  . Squamous cell skin cancer, earlobe   . Warts, genital     Past Surgical History:  Procedure Laterality Date  . COLONOSCOPY  08/12/2005  . [place] SURGERY  2004  . [place] SURGERY  05/2018   for comression fx    There were no vitals filed for this visit.  Subjective Assessment - 10/19/19 1158    Subjective  Did well with aquatic therapy - was different than he thought it would be.  A little sore in the ankles afterwards.    Pertinent History  HLD, sleep apnea, squamous cell skin CA, lumbar disc  surgery (2004, 2019)    Limitations  Walking;Standing    How long can you walk comfortably?  ~1 block    Patient Stated Goals  "get back to normal", walk without pain, throw a football out in the yard    Currently in Pain?  No/denies    Pain Onset  More than a month ago                       [facility] Adult PT Treatment/Exercise - 10/19/19 1200      Exercises   Exercises  Ankle      Ankle Exercises: Stretches   Gastroc Stretch  2 reps;20 seconds    Gastroc Stretch Limitations  standing with one foot on bottom cabinet shelf, heel down, knee straight, leaning forwards          Balance Exercises - 10/19/19 1217      Balance Exercises: Standing   Rockerboard  Anterior/posterior;Lateral;Head turns;EO;10 seconds;10 reps;Intermittent UE support   weight shift; stable w/out UE; head turns w/ UE support     OTAGO PROGRAM   Tandem Walk  Support    Heel Walking  Support    Toe Walk  Support    Overall OTAGO Comments  performed with one UE  support on counter - 3 laps down and back with supervision        PT Education - 10/19/19 1246    Education Details  printed new schedule for pt    Person(s) Educated  Patient    Methods  Explanation    Comprehension  Verbalized understanding       PT Short Term Goals - 09/28/19 1417      PT SHORT TERM GOAL #1   Title  Patient will be independent with initial LE strengthening/balance HEP. (all STGs target date 10/28/2019)    Time  4    Period  Weeks    Status  New    Target Date  10/28/19      PT SHORT TERM GOAL #2   Title  Patient will decrease 5xSTS by at least 3 seconds in order to demosntrate clinically significant improvement in LE strength.    Time  4    Period  Weeks    Status  New    Target Date  10/28/19      PT SHORT TERM GOAL #3   Title  Patient reports decrease in worst pain (8/10) by 2 points on the NPRS for improved pain and activity tolerance.    Time  4    Period  Weeks    Status  New    Target Date   10/28/19      PT SHORT TERM GOAL #4   Title  Patient will demosntrate midline resting/sitting posture to indicate dec avoidance of L wt shift.    Time  4    Period  Weeks    Status  New    Target Date  10/28/19      PT SHORT TERM GOAL #5   Title  Patient tolerates ambulation for 5 minutes over various terrain (indoor/outdoor)  without increase in pain for improved activity tolerance.    Time  4    Period  Weeks    Status  New    Target Date  10/28/19        PT Long Term Goals - 09/28/19 1421      PT LONG TERM GOAL #1   Title  Patient is independent with advanced HEP. (all LTGs target date: 11/27/2019)    Time  8    Period  Weeks    Status  New    Target Date  11/27/19      PT LONG TERM GOAL #2   Title  Patient will decrease 5xSTS <15 seconds to demonstrate improvement in functional strength.    Time  8    Period  Weeks    Status  New    Target Date  11/27/19      PT LONG TERM GOAL #3   Title  Patient tolerates 10 minutes of gait at community park without increase in pain for improved activity tolerance and community involvement.    Time  8    Period  Weeks    Status  New    Target Date  11/27/19      PT LONG TERM GOAL #4   Title  Patient demosntrates global bilateral LE strength 4/5 to demonstrate improvement in strength and function.    Time  8    Period  Weeks    Status  New    Target Date  11/27/19            Plan - 10/19/19 1247    Clinical Impression Statement  Pt demonstrating improved  LE ROM, trunk/spinal ROM and improved tolerance to activity in standing as demonstrated by pt's ability to perform balance training on rockerboard and higher level balance training at counter to focus on ankle and hip strengthening and hip/ankle strategies.  Only required one sitting rest break due to LE fatigue but pt reported no increase in radicular symptoms or pain.  Will continue to progress towards LTG.    Personal Factors and Comorbidities  Comorbidity  3+;Past/Current Experience;Fitness;Time since onset of injury/illness/exacerbation    Comorbidities  HLD, sleep apnea, squamous cell skin CA, lumbar disc surgery (2004, 2019) due to compression fracture    Examination-Activity Limitations  Stairs;Stand;Locomotion Level    Examination-Participation Restrictions  Community Activity    Stability/Clinical Decision Making  Evolving/Moderate complexity    Rehab Potential  Good    PT Frequency  2x / week    PT Duration  8 weeks    PT Treatment/Interventions  ADLs/Self Care Home Management;Gait training;Neuromuscular re-education;Stair training;Functional mobility training;Therapeutic activities;Therapeutic exercise;Balance training;Patient/family education;Aquatic Therapy;Cryotherapy;Moist Heat;Electrical Stimulation;DME Instruction;Orthotic Fit/Training;Manual techniques;Passive range of motion;Dry needling;Taping    PT Next Visit Plan  (Avoid flexion with rotation of spine due to h/o compression fracture and osteopenia)   DF strengthening due to L shuffling during gait.  Hip extension strengthening.  Hamstring lengthening, hip flexor stretch.  (review nerve sliders - do on R side first then L side).    Consulted and Agree with Plan of Care  Patient       Patient will benefit from skilled therapeutic intervention in order to improve the following deficits and impairments:  Abnormal gait, Decreased strength, Impaired sensation, Decreased balance, Difficulty walking, Decreased range of motion, Decreased activity tolerance, Postural dysfunction, Pain, Impaired flexibility  Visit Diagnosis: Chronic left-sided low back pain, unspecified whether sciatica present  Muscle weakness (generalized)  Abnormal posture  Other abnormalities of gait and mobility  Repeated falls     Problem List Patient Active Problem List   Diagnosis Date Noted  . Cough 11/28/2018  . Osteoporosis with pathological fracture 04/27/2018  . Compression fracture of L3 lumbar  vertebra, closed, initial encounter ([place]) 04/05/2018  . Low back pain 03/25/2018  . Hyperglycemia 04/26/2017  . Depression 04/12/2014  . Smoker 04/12/2014  . Right sided sciatica 03/15/2013  . Habitual alcohol use 03/15/2013  . Traveling 03/15/2013  . Preventative health care 03/10/2011  . OBSTRUCTIVE SLEEP APNEA 06/04/2010  . Hyperlipidemia 10/24/2007  . Allergic rhinitis 10/24/2007   Rico Junker, PT, DPT 10/19/19    12:59 PM    [facility] [address] [place], [place], [address] Phone: [phone]   Fax:  [phone]  Name: LAVORIS RICKETSON MRN: [id number] Date of Birth: Feb 19, 1946

## 2019-10-21 ENCOUNTER — Ambulatory Visit: Payer: Medicare Other

## 2019-10-22 ENCOUNTER — Other Ambulatory Visit: Payer: Self-pay

## 2019-10-22 ENCOUNTER — Encounter: Payer: Self-pay | Admitting: Physical Therapy

## 2019-10-22 ENCOUNTER — Other Ambulatory Visit: Payer: Self-pay | Admitting: Internal Medicine

## 2019-10-22 ENCOUNTER — Ambulatory Visit: Payer: Medicare Other | Admitting: Physical Therapy

## 2019-10-22 ENCOUNTER — Ambulatory Visit: Payer: Medicare Other | Admitting: Rehabilitation

## 2019-10-22 DIAGNOSIS — R2689 Other abnormalities of gait and mobility: Secondary | ICD-10-CM | POA: Diagnosis not present

## 2019-10-22 DIAGNOSIS — G8929 Other chronic pain: Secondary | ICD-10-CM | POA: Diagnosis not present

## 2019-10-22 DIAGNOSIS — R296 Repeated falls: Secondary | ICD-10-CM | POA: Diagnosis not present

## 2019-10-22 DIAGNOSIS — M6281 Muscle weakness (generalized): Secondary | ICD-10-CM | POA: Diagnosis not present

## 2019-10-22 DIAGNOSIS — M545 Low back pain, unspecified: Secondary | ICD-10-CM

## 2019-10-22 DIAGNOSIS — R293 Abnormal posture: Secondary | ICD-10-CM | POA: Diagnosis not present

## 2019-10-22 NOTE — Therapy (Signed)
[facility] [address] [place], [place], [address] Phone: [phone]   Fax:  [phone]  Physical Therapy Treatment  Patient Details  Name: Manuel Cruz MRN: [id number] Date of Birth: 1945-12-11 Referring Provider (PT): Penni Bombard, MD   Encounter Date: 10/22/2019  PT End of Session - 10/22/19 1616    Visit Number  6    Number of Visits  17    Date for PT Re-Evaluation  11/27/19    Authorization Type  Medicare Primary/BCBS Seconday    PT Start Time  1415    PT Stop Time  1500    PT Time Calculation (min)  45 min    Equipment Utilized During Treatment  --   ankle buoyancy cuffs   Activity Tolerance  Patient tolerated treatment well    Behavior During Therapy  [facility] for tasks assessed/performed       Past Medical History:  Diagnosis Date  . Allergic rhinitis   . Allergy   . GERD (gastroesophageal reflux disease)   . Hyperlipidemia   . Sciatica of right side    recurrent  . Sleep apnea    pt denies  . 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 Squamous cell carcinoma in situ (SCCIS) 02/01/2012   Left Sideburn (tx p bx)  . Squamous cell carcinoma in situ (SCCIS) 04/05/2016   Left Forearm(Cx3,5FU), Left Sideburn Sup(Cx3,5FU), and Left Sideburn Inf (Cx3,5FU)  . Squamous cell carcinoma in situ (SCCIS) 05/06/2016   Left Side Neck (tx p bx)  . Squamous cell skin cancer, earlobe   . Warts, genital     Past Surgical History:  Procedure Laterality Date  . COLONOSCOPY  08/12/2005  . [place] SURGERY  2004  . [place] SURGERY  05/2018   for comression fx    There were no vitals filed for this visit.  Subjective Assessment - 10/22/19 1615    Subjective  Denies any falls or changes.    Pertinent History  HLD, sleep apnea, squamous cell skin CA, lumbar disc surgery (2004, 2019)    Limitations  Walking;Standing    How long can you walk comfortably?   ~1 block    Patient Stated Goals  "get back to normal", walk without pain, throw a football out in the yard    Currently in Pain?  No/denies       Aquatic therapy at [facility] - pool temp 86.7degrees  Patient seen for aquatic therapy today. Treatment took place in water 3.5-4 feet deep depending upon activity. Pt entered and exited the poolvia step negotiation with use of handrails modified independently.  Pt performed runners stretch bil sides with 30 sec hold then toes up wall for 30 second hold.  Pt needs cues for technique and UE support of pool wall. .  Pt performed water walking 19m across pool forwards x 2 repsWalked 43mx forward working on high marching then 38m backward working on [employer]  Sidesteppingx 49m x 2 with squatand shoulder abd/add.  Backwards walking 7m x 2 reps with unsteadiness but using UE's to assist with balance.   Performed bil LE hip abduction, hip flexion, hamstring curland hip extension x 10 reps alternating LE's. Needing cues for technique.  Requiring1 UE support for balance. Stepping single leg forward >starting position using pool tile for visual cue x 15 reps each side for balance. Squats x 10x 2 sets with UE support of pool edge.  Heel raises x 10, toe raises x 10 then heel<>toe raise x 10.  Pt requiring UE support.  Tandem stance both directions x 10 sec x 3 reps with 1 UE support.  SLS x 10 sec x 3 reps both legs with 1 UE support.  Tandem gait x 46m x 2 reps forwards and 2 reps backwards.   Braiding with pt holding pool noodle for support x 35m to L then 84m to R.  Pt needing PTA assist to support noodle and cues for sequence.  Pt appeared to have increased difficulty with braiding to R/using LLE in front then behind.  Performed runners stretch again bil sides 30 seconds and feet up wall gastroc stretch x 30 seconds.  Standing at pool steps with railing support for bil heel cord stretch of edge of step x 30 sec.   Pt requires the  buoyancy and viscosity of the water for resistance and for support with balance exercises - pt able to safely perform balance activities without risk of falling; current of water provides mild perturbations for challenge with balance. Also used resistance of water for strengthening exercises.    PT Short Term Goals - 09/28/19 1417      PT SHORT TERM GOAL #1   Title  Patient will be independent with initial LE strengthening/balance HEP. (all STGs target date 10/28/2019)    Time  4    Period  Weeks    Status  New    Target Date  10/28/19      PT SHORT TERM GOAL #2   Title  Patient will decrease 5xSTS by at least 3 seconds in order to demosntrate clinically significant improvement in LE strength.    Time  4    Period  Weeks    Status  New    Target Date  10/28/19      PT SHORT TERM GOAL #3   Title  Patient reports decrease in worst pain (8/10) by 2 points on the NPRS for improved pain and activity tolerance.    Time  4    Period  Weeks    Status  New    Target Date  10/28/19      PT SHORT TERM GOAL #4   Title  Patient will demosntrate midline resting/sitting posture to indicate dec avoidance of L wt shift.    Time  4    Period  Weeks    Status  New    Target Date  10/28/19      PT SHORT TERM GOAL #5   Title  Patient tolerates ambulation for 5 minutes over various terrain (indoor/outdoor)  without increase in pain for improved activity tolerance.    Time  4    Period  Weeks    Status  New    Target Date  10/28/19        PT Long Term Goals - 09/28/19 1421      PT LONG TERM GOAL #1   Title  Patient is independent with advanced HEP. (all LTGs target date: 11/27/2019)    Time  8    Period  Weeks    Status  New    Target Date  11/27/19      PT LONG TERM GOAL #2   Title  Patient will decrease 5xSTS <15 seconds to demonstrate improvement in functional strength.    Time  8    Period  Weeks    Status  New    Target Date  11/27/19  PT LONG TERM GOAL #3   Title   Patient tolerates 10 minutes of gait at community park without increase in pain for improved activity tolerance and community involvement.    Time  8    Period  Weeks    Status  New    Target Date  11/27/19      PT LONG TERM GOAL #4   Title  Patient demosntrates global bilateral LE strength 4/5 to demonstrate improvement in strength and function.    Time  8    Period  Weeks    Status  New    Target Date  11/27/19            Plan - 10/22/19 1617    Clinical Impression Statement  Pt requires UE support of pool edge/rope/noodle for balance activities along with strengthening exercises.  Uses UE's for additional support in water against waters resistance.  Continue PT per POC.    Personal Factors and Comorbidities  Comorbidity 3+;Past/Current Experience;Fitness;Time since onset of injury/illness/exacerbation    Comorbidities  HLD, sleep apnea, squamous cell skin CA, lumbar disc surgery (2004, 2019) due to compression fracture    Examination-Activity Limitations  Stairs;Stand;Locomotion Level    Examination-Participation Restrictions  Community Activity    Stability/Clinical Decision Making  Evolving/Moderate complexity    Rehab Potential  Good    PT Frequency  2x / week    PT Duration  8 weeks    PT Treatment/Interventions  ADLs/Self Care Home Management;Gait training;Neuromuscular re-education;Stair training;Functional mobility training;Therapeutic activities;Therapeutic exercise;Balance training;Patient/family education;Aquatic Therapy;Cryotherapy;Moist Heat;Electrical Stimulation;DME Instruction;Orthotic Fit/Training;Manual techniques;Passive range of motion;Dry needling;Taping    PT Next Visit Plan  (Avoid flexion with rotation of spine due to h/o compression fracture and osteopenia)   DF strengthening due to L shuffling during gait.  Hip extension strengthening.  Hamstring lengthening, hip flexor stretch.  (review nerve sliders - do on R side first then L side).    Consulted and  Agree with Plan of Care  Patient       Patient will benefit from skilled therapeutic intervention in order to improve the following deficits and impairments:  Abnormal gait, Decreased strength, Impaired sensation, Decreased balance, Difficulty walking, Decreased range of motion, Decreased activity tolerance, Postural dysfunction, Pain, Impaired flexibility  Visit Diagnosis: Chronic left-sided low back pain, unspecified whether sciatica present  Muscle weakness (generalized)  Abnormal posture     Problem List Patient Active Problem List   Diagnosis Date Noted  . Cough 11/28/2018  . Osteoporosis with pathological fracture 04/27/2018  . Compression fracture of L3 lumbar vertebra, closed, initial encounter ([place]) 04/05/2018  . Low back pain 03/25/2018  . Hyperglycemia 04/26/2017  . Depression 04/12/2014  . Smoker 04/12/2014  . Right sided sciatica 03/15/2013  . Habitual alcohol use 03/15/2013  . Traveling 03/15/2013  . Preventative health care 03/10/2011  . OBSTRUCTIVE SLEEP APNEA 06/04/2010  . Hyperlipidemia 10/24/2007  . Allergic rhinitis 10/24/2007    Narda Bonds, PTA [facility] 10/22/19 4:26 PM Phone: [phone] Fax: [facility] [address] [place], [place], [address] Phone: [phone]   Fax:  [phone]  Name: Manuel Cruz MRN: [id number] Date of Birth: 1946-08-20

## 2019-10-26 ENCOUNTER — Ambulatory Visit: Payer: Medicare Other | Admitting: Physical Therapy

## 2019-10-26 ENCOUNTER — Encounter: Payer: Self-pay | Admitting: Physical Therapy

## 2019-10-26 ENCOUNTER — Other Ambulatory Visit: Payer: Self-pay

## 2019-10-26 DIAGNOSIS — R293 Abnormal posture: Secondary | ICD-10-CM | POA: Diagnosis not present

## 2019-10-26 DIAGNOSIS — M6281 Muscle weakness (generalized): Secondary | ICD-10-CM

## 2019-10-26 DIAGNOSIS — G8929 Other chronic pain: Secondary | ICD-10-CM | POA: Diagnosis not present

## 2019-10-26 DIAGNOSIS — R296 Repeated falls: Secondary | ICD-10-CM | POA: Diagnosis not present

## 2019-10-26 DIAGNOSIS — R2689 Other abnormalities of gait and mobility: Secondary | ICD-10-CM

## 2019-10-26 DIAGNOSIS — M545 Low back pain: Secondary | ICD-10-CM | POA: Diagnosis not present

## 2019-10-26 NOTE — Therapy (Signed)
[facility] [address] [place], [place], [address] Phone: [phone]   Fax:  [phone]  Physical Therapy Treatment  Patient Details  Name: Manuel Cruz MRN: [id number] Date of Birth: 1946/07/16 Referring Provider (PT): Penni Bombard, MD   Encounter Date: 10/26/2019  PT End of Session - 10/26/19 1237    Visit Number  7    Number of Visits  17    Date for PT Re-Evaluation  11/27/19    Authorization Type  Medicare Primary/BCBS Seconday    PT Start Time  1137    PT Stop Time  1220    PT Time Calculation (min)  43 min    Equipment Utilized During Treatment  --   ankle buoyancy cuffs   Activity Tolerance  Patient tolerated treatment well    Behavior During Therapy  [facility] for tasks assessed/performed       Past Medical History:  Diagnosis Date  . Allergic rhinitis   . Allergy   . GERD (gastroesophageal reflux disease)   . Hyperlipidemia   . Sciatica of right side    recurrent  . Sleep apnea    pt denies  . 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 Squamous cell carcinoma in situ (SCCIS) 02/01/2012   Left Sideburn (tx p bx)  . Squamous cell carcinoma in situ (SCCIS) 04/05/2016   Left Forearm(Cx3,5FU), Left Sideburn Sup(Cx3,5FU), and Left Sideburn Inf (Cx3,5FU)  . Squamous cell carcinoma in situ (SCCIS) 05/06/2016   Left Side Neck (tx p bx)  . Squamous cell skin cancer, earlobe   . Warts, genital     Past Surgical History:  Procedure Laterality Date  . COLONOSCOPY  08/12/2005  . [place] SURGERY  2004  . [place] SURGERY  05/2018   for comression fx    There were no vitals filed for this visit.  Subjective Assessment - 10/26/19 1134    Subjective  Aquatic therapy still going well.  No significant pain afterwards but feels worked.    Pertinent History  HLD, sleep apnea, squamous cell skin CA, lumbar disc surgery (2004, 2019)    Limitations   Walking;Standing    How long can you walk comfortably?  ~1 block    Patient Stated Goals  "get back to normal", walk without pain, throw a football out in the yard    Currently in Pain?  No/denies         [facility] PT Assessment - 10/26/19 1153      Flexibility   Hamstrings  RLE passive SLR: 40 deg; LLE passive SLR: 50 deg - no pain - stretching      6 Minute Walk- Baseline   6 Minute Walk- Baseline  yes    BP (mmHg)  (!) 136/97    HR (bpm)  90    02 Sat (%RA)  98 %    Modified Borg Scale for Dyspnea  0- Nothing at all    Perceived Rate of Exertion (Borg)  6-      6 Minute walk- Post Test   6 Minute Walk Post Test  yes    BP (mmHg)  140/90    HR (bpm)  100    02 Sat (%RA)  100 %    Modified Borg Scale for Dyspnea  1- Very mild shortness of breath    Perceived Rate of Exertion (Borg)  11- Fairly light      6  minute walk test results    Aerobic Endurance Distance Walked  [phone]    Endurance additional comments  pain began at 0/10; 1-2/10 pain in L lower leg below knee but settled quickly      Standardized Balance Assessment   Standardized Balance Assessment  Five Times Sit to Stand    Five times sit to stand comments   17.62 without use of UE, no pain                           PT Education - 10/26/19 1236    Education Details  progress towards goals, areas to continue to work on for LTG    Person(s) Educated  Patient    Methods  Explanation    Comprehension  Verbalized understanding       PT Short Term Goals - 10/26/19 1137      PT SHORT TERM GOAL #1   Title  Patient will be independent with initial LE strengthening/balance HEP. (all STGs target date 10/28/2019)    Time  4    Period  Weeks    Status  Achieved    Target Date  10/28/19      PT SHORT TERM GOAL #2   Title  Patient will decrease 5xSTS by at least 3 seconds in order to demosntrate clinically significant improvement in LE strength.    Baseline  17 seconds    Time  4    Period  Weeks     Status  Achieved    Target Date  10/28/19      PT SHORT TERM GOAL #3   Title  Patient reports decrease in worst pain (8/10) by 2 points on the NPRS for improved pain and activity tolerance.    Baseline  Pain at worst since starting therapy: 3-4/10    Time  4    Period  Weeks    Status  Achieved    Target Date  10/28/19      PT SHORT TERM GOAL #4   Title  Patient will demosntrate midline resting/sitting posture to indicate dec avoidance of L wt shift.    Time  4    Period  Weeks    Status  Achieved    Target Date  10/28/19      PT SHORT TERM GOAL #5   Title  Patient tolerates ambulation for 5 minutes over various terrain (indoor/outdoor)  without increase in pain for improved activity tolerance.    Time  4    Period  Weeks    Status  Achieved    Target Date  10/28/19        PT Long Term Goals - 10/26/19 1220      PT LONG TERM GOAL #1   Title  Patient is independent with advanced HEP. (all LTGs target date: 11/27/2019)    Time  8    Period  Weeks    Status  New      PT LONG TERM GOAL #2   Title  Patient will decrease 5xSTS <15 seconds to demonstrate improvement in functional strength.    Time  8    Period  Weeks    Status  New      PT LONG TERM GOAL #3   Title  Pt will perform 6 minute walk test of endurance outside and increase distance to >1200' with </=2/10 pain in LLE    Time  8    Period  Weeks  Status  Revised      PT LONG TERM GOAL #4   Title  Pt will demonstrate ability to rise from the floor without pushing through hands (pt likes to sit on the floor to watch TV).    Time  8    Period  Weeks    Status  Revised    Target Date  11/27/19            Plan - 10/26/19 1239    Clinical Impression Statement  Performed assessment of STG to determine patient's overall progress.  Pt is making excellent progress and has met all STG.  Pt reporting significant decrease in pain overall, even pain at worst, demonstrated improved postural alignment in sitting and  with gait, improved activity tolerance and improved LE strength.  Pt continues to demonstrate intermittent L toe drag with prolonged gait and deficits in balance and functional LE strength.  Pt to continue to address in order to progress towards revised LTG.    Personal Factors and Comorbidities  Comorbidity 3+;Past/Current Experience;Fitness;Time since onset of injury/illness/exacerbation    Comorbidities  HLD, sleep apnea, squamous cell skin CA, lumbar disc surgery (2004, 2019) due to compression fracture    Examination-Activity Limitations  Stairs;Stand;Locomotion Level    Examination-Participation Restrictions  Community Activity    Stability/Clinical Decision Making  Evolving/Moderate complexity    Rehab Potential  Good    PT Frequency  2x / week    PT Duration  8 weeks    PT Treatment/Interventions  ADLs/Self Care Home Management;Gait training;Neuromuscular re-education;Stair training;Functional mobility training;Therapeutic activities;Therapeutic exercise;Balance training;Patient/family education;Aquatic Therapy;Cryotherapy;Moist Heat;Electrical Stimulation;DME Instruction;Orthotic Fit/Training;Manual techniques;Passive range of motion;Dry needling;Taping    PT Next Visit Plan  (Avoid flexion with rotation of spine due to h/o compression fracture and osteopenia)   DF strengthening due to L shuffling during gait.  Patient wants to be able to get on/off floor without use of UE - begin to address.    Consulted and Agree with Plan of Care  Patient       Patient will benefit from skilled therapeutic intervention in order to improve the following deficits and impairments:  Abnormal gait, Decreased strength, Impaired sensation, Decreased balance, Difficulty walking, Decreased range of motion, Decreased activity tolerance, Postural dysfunction, Pain, Impaired flexibility  Visit Diagnosis: Chronic left-sided low back pain, unspecified whether sciatica present  Muscle weakness  (generalized)  Abnormal posture  Other abnormalities of gait and mobility  Repeated falls     Problem List Patient Active Problem List   Diagnosis Date Noted  . Cough 11/28/2018  . Osteoporosis with pathological fracture 04/27/2018  . Compression fracture of L3 lumbar vertebra, closed, initial encounter ([place]) 04/05/2018  . Low back pain 03/25/2018  . Hyperglycemia 04/26/2017  . Depression 04/12/2014  . Smoker 04/12/2014  . Right sided sciatica 03/15/2013  . Habitual alcohol use 03/15/2013  . Traveling 03/15/2013  . Preventative health care 03/10/2011  . OBSTRUCTIVE SLEEP APNEA 06/04/2010  . Hyperlipidemia 10/24/2007  . Allergic rhinitis 10/24/2007    Rico Junker, PT, DPT 10/26/19    12:44 PM    [facility] [address] [place], [place], [address] Phone: [phone]   Fax:  [phone]  Name: Manuel Cruz MRN: [id number] Date of Birth: 08-10-1946

## 2019-10-29 ENCOUNTER — Other Ambulatory Visit: Payer: Self-pay

## 2019-10-29 ENCOUNTER — Encounter: Payer: Self-pay | Admitting: Physical Therapy

## 2019-10-29 ENCOUNTER — Ambulatory Visit: Payer: Medicare Other | Admitting: Physical Therapy

## 2019-10-29 DIAGNOSIS — M6281 Muscle weakness (generalized): Secondary | ICD-10-CM | POA: Diagnosis not present

## 2019-10-29 DIAGNOSIS — R293 Abnormal posture: Secondary | ICD-10-CM | POA: Diagnosis not present

## 2019-10-29 DIAGNOSIS — R2689 Other abnormalities of gait and mobility: Secondary | ICD-10-CM | POA: Diagnosis not present

## 2019-10-29 DIAGNOSIS — G8929 Other chronic pain: Secondary | ICD-10-CM

## 2019-10-29 DIAGNOSIS — M545 Low back pain: Secondary | ICD-10-CM | POA: Diagnosis not present

## 2019-10-29 DIAGNOSIS — R296 Repeated falls: Secondary | ICD-10-CM | POA: Diagnosis not present

## 2019-10-29 NOTE — Therapy (Signed)
[facility] [address] [place], [place], [address] Phone: [phone]   Fax:  [phone]  Physical Therapy Treatment  Patient Details  Name: Manuel Cruz MRN: [id number] Date of Birth: 09/08/1946 Referring Provider (PT): Penni Bombard, MD   Encounter Date: 10/29/2019  PT End of Session - 10/29/19 1936    Visit Number  8    Number of Visits  17    Date for PT Re-Evaluation  11/27/19    Authorization Type  Medicare Primary/BCBS Seconday    PT Start Time  1415    PT Stop Time  1500    PT Time Calculation (min)  45 min    Equipment Utilized During Treatment  Other (comment)   ankle buoyancy cuffs, noddle, neck noodle, floatation belt   Activity Tolerance  Patient tolerated treatment well    Behavior During Therapy  [facility] for tasks assessed/performed       Past Medical History:  Diagnosis Date  . Allergic rhinitis   . Allergy   . GERD (gastroesophageal reflux disease)   . Hyperlipidemia   . Sciatica of right side    recurrent  . Sleep apnea    pt denies  . 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 Squamous cell carcinoma in situ (SCCIS) 02/01/2012   Left Sideburn (tx p bx)  . Squamous cell carcinoma in situ (SCCIS) 04/05/2016   Left Forearm(Cx3,5FU), Left Sideburn Sup(Cx3,5FU), and Left Sideburn Inf (Cx3,5FU)  . Squamous cell carcinoma in situ (SCCIS) 05/06/2016   Left Side Neck (tx p bx)  . Squamous cell skin cancer, earlobe   . Warts, genital     Past Surgical History:  Procedure Laterality Date  . COLONOSCOPY  08/12/2005  . [place] SURGERY  2004  . [place] SURGERY  05/2018   for comression fx    There were no vitals filed for this visit.  Subjective Assessment - 10/29/19 1935    Subjective  Denies any changes or pain.  Feels like he is getting stronger and is able to walk further.    Pertinent History  HLD, sleep apnea, squamous cell  skin CA, lumbar disc surgery (2004, 2019)    Limitations  Walking;Standing    How long can you walk comfortably?  ~1 block    Patient Stated Goals  "get back to normal", walk without pain, throw a football out in the yard    Currently in Pain?  No/denies       Aquatic therapy at [facility] - pool temp 86.7degrees  Patient seen for aquatic therapy today. Treatment took place in water 3.5-4 feet deep depending upon activity. Pt entered and exited the poolvia step negotiation with use of handrails modified independently.  Pt performed runners stretch bil sides with 30 sec hold then toes up wall for 30 second hold.  Pt needs cues for technique and UE support of pool wall. .  Pt performed water walking 38m across pool forwards x 2 repsWalked 21mx forward working on high marching then 85m backward working on [employer]  Sidesteppingx25m x 2 with squatand shoulder abd/add.Backwards walking 59m x 2 reps with unsteadiness but using UE's to assist with balance.  Performed bil LE hip abduction, hip flexion, hamstring curlandhip extensionx 15 reps alternating LE's with buoyancy cuffs. Needing cues for technique.Requiring1 UE support for balance with some exercises.  Heel raises x 20, toe raises x 20. Pt requiring UE support.  Seated on pool bench with ankle cuffs for bil LE LAQ x 15 and hip flexion x 15.  Pt needing UE's to maintain seated positionon bench.  Sit<>stand x 10 with 1 UE assist then x 5 without UE assist. Single leg squats x 10 on each side with 1 UE support.  Supine with waist floatation belt, pool noodle under arms and neck noodle.  Performed gentle rocking of LE's by PTA x 25 m.  Performed bicycling LE's x 76m and hip abd/add x 37m.  Placed pt's feet on pool edge with pt in same position and assisted pt into knee/hip flexion then pt pushing off edge of pool x 10 reps.  Pt requires the buoyancy and viscosity of the water for resistance and for support with balance  exercises - pt able to safely perform balance activities without risk of falling; current of water provides mild perturbations for challenge with balance. Also used resistance of water for strengthening exercises.  PT Short Term Goals - 10/26/19 1137      PT SHORT TERM GOAL #1   Title  Patient will be independent with initial LE strengthening/balance HEP. (all STGs target date 10/28/2019)    Time  4    Period  Weeks    Status  Achieved    Target Date  10/28/19      PT SHORT TERM GOAL #2   Title  Patient will decrease 5xSTS by at least 3 seconds in order to demosntrate clinically significant improvement in LE strength.    Baseline  17 seconds    Time  4    Period  Weeks    Status  Achieved    Target Date  10/28/19      PT SHORT TERM GOAL #3   Title  Patient reports decrease in worst pain (8/10) by 2 points on the NPRS for improved pain and activity tolerance.    Baseline  Pain at worst since starting therapy: 3-4/10    Time  4    Period  Weeks    Status  Achieved    Target Date  10/28/19      PT SHORT TERM GOAL #4   Title  Patient will demosntrate midline resting/sitting posture to indicate dec avoidance of L wt shift.    Time  4    Period  Weeks    Status  Achieved    Target Date  10/28/19      PT SHORT TERM GOAL #5   Title  Patient tolerates ambulation for 5 minutes over various terrain (indoor/outdoor)  without increase in pain for improved activity tolerance.    Time  4    Period  Weeks    Status  Achieved    Target Date  10/28/19        PT Long Term Goals - 10/26/19 1220      PT LONG TERM GOAL #1   Title  Patient is independent with advanced HEP. (all LTGs target date: 11/27/2019)    Time  8    Period  Weeks    Status  New      PT LONG TERM GOAL #2   Title  Patient will decrease 5xSTS <15 seconds to demonstrate improvement in functional strength.    Time  8    Period  Weeks    Status  New      PT LONG TERM GOAL #3   Title  Pt will perform 6 minute walk  test of endurance outside and increase  distance to >1200' with </=2/10 pain in LLE    Time  8    Period  Weeks    Status  Revised      PT LONG TERM GOAL #4   Title  Pt will demonstrate ability to rise from the floor without pushing through hands (pt likes to sit on the floor to watch TV).    Time  8    Period  Weeks    Status  Revised    Target Date  11/27/19            Plan - 10/29/19 1937    Clinical Impression Statement  Pt's balance is improving in the pool along with endurance.  Pt continues to have tightness in hamstrings evident during aquatic activities.  Pt reporting increased gait distance and strength.  Continue PT per POC.    Personal Factors and Comorbidities  Comorbidity 3+;Past/Current Experience;Fitness;Time since onset of injury/illness/exacerbation    Comorbidities  HLD, sleep apnea, squamous cell skin CA, lumbar disc surgery (2004, 2019) due to compression fracture    Examination-Activity Limitations  Stairs;Stand;Locomotion Level    Examination-Participation Restrictions  Community Activity    Stability/Clinical Decision Making  Evolving/Moderate complexity    Rehab Potential  Good    PT Frequency  2x / week    PT Duration  8 weeks    PT Treatment/Interventions  ADLs/Self Care Home Management;Gait training;Neuromuscular re-education;Stair training;Functional mobility training;Therapeutic activities;Therapeutic exercise;Balance training;Patient/family education;Aquatic Therapy;Cryotherapy;Moist Heat;Electrical Stimulation;DME Instruction;Orthotic Fit/Training;Manual techniques;Passive range of motion;Dry needling;Taping    PT Next Visit Plan  (Avoid flexion with rotation of spine due to h/o compression fracture and osteopenia)   DF strengthening due to L shuffling during gait.  Patient wants to be able to get on/off floor without use of UE - begin to address.    Consulted and Agree with Plan of Care  Patient       Patient will benefit from skilled therapeutic  intervention in order to improve the following deficits and impairments:  Abnormal gait, Decreased strength, Impaired sensation, Decreased balance, Difficulty walking, Decreased range of motion, Decreased activity tolerance, Postural dysfunction, Pain, Impaired flexibility  Visit Diagnosis: Chronic left-sided low back pain, unspecified whether sciatica present  Muscle weakness (generalized)  Abnormal posture  Other abnormalities of gait and mobility     Problem List Patient Active Problem List   Diagnosis Date Noted  . Cough 11/28/2018  . Osteoporosis with pathological fracture 04/27/2018  . Compression fracture of L3 lumbar vertebra, closed, initial encounter ([place]) 04/05/2018  . Low back pain 03/25/2018  . Hyperglycemia 04/26/2017  . Depression 04/12/2014  . Smoker 04/12/2014  . Right sided sciatica 03/15/2013  . Habitual alcohol use 03/15/2013  . Traveling 03/15/2013  . Preventative health care 03/10/2011  . OBSTRUCTIVE SLEEP APNEA 06/04/2010  . Hyperlipidemia 10/24/2007  . Allergic rhinitis 10/24/2007    Manuel Cruz, PTA [place] 10/29/19 7:46 PM Phone: [phone] Fax: [facility] [address] [place], [place], [address] Phone: [phone]   Fax:  [phone]  Name: Manuel Cruz MRN: [id number] Date of Birth: 13-Apr-1946

## 2019-10-31 ENCOUNTER — Ambulatory Visit: Payer: Medicare Other | Admitting: Physical Therapy

## 2019-11-02 ENCOUNTER — Encounter: Payer: Self-pay | Admitting: Physical Therapy

## 2019-11-02 ENCOUNTER — Ambulatory Visit: Payer: Medicare Other | Admitting: Physical Therapy

## 2019-11-02 ENCOUNTER — Other Ambulatory Visit: Payer: Self-pay

## 2019-11-02 DIAGNOSIS — M6281 Muscle weakness (generalized): Secondary | ICD-10-CM | POA: Diagnosis not present

## 2019-11-02 DIAGNOSIS — G8929 Other chronic pain: Secondary | ICD-10-CM

## 2019-11-02 DIAGNOSIS — R293 Abnormal posture: Secondary | ICD-10-CM | POA: Diagnosis not present

## 2019-11-02 DIAGNOSIS — M545 Low back pain, unspecified: Secondary | ICD-10-CM

## 2019-11-02 DIAGNOSIS — R2689 Other abnormalities of gait and mobility: Secondary | ICD-10-CM | POA: Diagnosis not present

## 2019-11-02 DIAGNOSIS — R296 Repeated falls: Secondary | ICD-10-CM | POA: Diagnosis not present

## 2019-11-02 NOTE — Patient Instructions (Addendum)
Walking: Walking at the park for 6-8 minutes at a comfortable pace.  Pain should not go above 4-5 out of 10.     Access Code: [email or web address] URL: [email or web address] Date: 11/02/2019 Prepared by: Misty Stanley  Exercises Seated Hamstring Stretch with Strap - 2 sets - 20 second hold - 1x daily - 7x weekly Hooklying Single Knee to Chest - 4 reps - 10 second hold - 1x daily - 7x weekly Supine Double Knee to Chest - 2 reps - 10 second hold - 1x daily - 7x weekly Supine Piriformis Stretch with Foot on Ground - 4 reps - 10 second hold hold - 1x daily - 7x weekly Supine Lower Trunk Rotation - 10 reps - 1x daily - 7x weekly Heel Toe Raises with Counter Support - 10 reps - 1 sets - 1x daily - 7x weekly Standing Gastroc Stretch on Step with Counter Support - 2 sets - 15-20 second hold - 1x daily - 7x weekly Heel Walking with Counter Support - 4 sets - 1x daily - 7x weekly Isometric Gluteus Medius at Wall - 6 reps - 2 sets - 6 seconds hold - 1x daily - 7x weekly Mini Squat with Counter Support - 10 reps - 2 sets - 1x daily - 7x weekly

## 2019-11-02 NOTE — Therapy (Signed)
[facility] [address] [place], [place], [address] Phone: [phone]   Fax:  [phone]  Physical Therapy Treatment  Patient Details  Name: Manuel Cruz MRN: [id number] Date of Birth: 1946-02-03 Referring Provider (PT): Penni Bombard, MD   Encounter Date: 11/02/2019  PT End of Session - 11/02/19 1147    Visit Number  9    Number of Visits  17    Date for PT Re-Evaluation  11/27/19    Authorization Type  Medicare Primary/BCBS Seconday    PT Start Time  1055    PT Stop Time  1142    PT Time Calculation (min)  47 min    Activity Tolerance  Patient tolerated treatment well    Behavior During Therapy  [facility] for tasks assessed/performed       Past Medical History:  Diagnosis Date  . Allergic rhinitis   . Allergy   . GERD (gastroesophageal reflux disease)   . Hyperlipidemia   . Sciatica of right side    recurrent  . Sleep apnea    pt denies  . 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 Squamous cell carcinoma in situ (SCCIS) 02/01/2012   Left Sideburn (tx p bx)  . Squamous cell carcinoma in situ (SCCIS) 04/05/2016   Left Forearm(Cx3,5FU), Left Sideburn Sup(Cx3,5FU), and Left Sideburn Inf (Cx3,5FU)  . Squamous cell carcinoma in situ (SCCIS) 05/06/2016   Left Side Neck (tx p bx)  . Squamous cell skin cancer, earlobe   . Warts, genital     Past Surgical History:  Procedure Laterality Date  . COLONOSCOPY  08/12/2005  . [place] SURGERY  2004  . [place] SURGERY  05/2018   for comression fx    There were no vitals filed for this visit.  Subjective Assessment - 11/02/19 1057    Subjective  No pain or issues to report.  Has one more pool appointment next week and then transitions back to land only appointments.    Pertinent History  HLD, sleep apnea, squamous cell skin CA, lumbar disc surgery (2004, 2019)    Limitations  Walking;Standing    How  long can you walk comfortably?  ~1 block    Patient Stated Goals  "get back to normal", walk without pain, throw a football out in the yard    Currently in Pain?  No/denies                       [facility] Adult PT Treatment/Exercise - 11/02/19 1100      Transfers   Transfers  Floor to Transfer    Floor to Transfer  5: Supervision;With upper extremity assist    Floor to Transfer Details (indicate cue type and reason)  performed multiple methods of floor > stand from half kneeling, wide stance leaning forwards, rolling from side > bilateral stance and leaning forwards pushing off with UE.  Demonstrated to pt how to use split stance lunges to floor on each LE with UE support to strengthen LE for floor > stand          Balance Exercises - 11/02/19 1136      Balance Exercises: Standing   Standing Eyes Opened  Narrow base of support (BOS);Head turns;Foam/compliant surface;Other reps (comment)   10 reps head turns/nods   Standing Eyes Closed  Narrow base of support (BOS);Foam/compliant surface;10 secs   4 sets, supervision-min A  Walking: Walking at the park for 6-8 minutes at a comfortable pace.  Pain should not go above 4-5 out of 10.     Access Code: [email or web address] URL: [email or web address] Date: 11/02/2019 Prepared by: Misty Stanley  Exercises Seated Hamstring Stretch with Strap - 2 sets - 20 second hold - 1x daily - 7x weekly Hooklying Single Knee to Chest - 4 reps - 10 second hold - 1x daily - 7x weekly Supine Double Knee to Chest - 2 reps - 10 second hold - 1x daily - 7x weekly Supine Piriformis Stretch with Foot on Ground - 4 reps - 10 second hold hold - 1x daily - 7x weekly Supine Lower Trunk Rotation - 10 reps - 1x daily - 7x weekly Heel Toe Raises with Counter Support - 10 reps - 1 sets - 1x daily - 7x weekly Standing Gastroc Stretch on Step with Counter Support - 2 sets - 15-20 second hold - 1x daily - 7x weekly Heel Walking with Counter  Support - 4 sets - 1x daily - 7x weekly Isometric Gluteus Medius at Wall - 6 reps - 2 sets - 6 seconds hold - 1x daily - 7x weekly Mini Squat with Counter Support - 10 reps - 2 sets - 1x daily - 7x weekly   PT Education - 11/02/19 1146    Education Details  floor to stand transfers, progressed HEP    Person(s) Educated  Patient    Methods  Explanation;Demonstration;Handout    Comprehension  Verbalized understanding;Returned demonstration       PT Short Term Goals - 10/26/19 1137      PT SHORT TERM GOAL #1   Title  Patient will be independent with initial LE strengthening/balance HEP. (all STGs target date 10/28/2019)    Time  4    Period  Weeks    Status  Achieved    Target Date  10/28/19      PT SHORT TERM GOAL #2   Title  Patient will decrease 5xSTS by at least 3 seconds in order to demosntrate clinically significant improvement in LE strength.    Baseline  17 seconds    Time  4    Period  Weeks    Status  Achieved    Target Date  10/28/19      PT SHORT TERM GOAL #3   Title  Patient reports decrease in worst pain (8/10) by 2 points on the NPRS for improved pain and activity tolerance.    Baseline  Pain at worst since starting therapy: 3-4/10    Time  4    Period  Weeks    Status  Achieved    Target Date  10/28/19      PT SHORT TERM GOAL #4   Title  Patient will demosntrate midline resting/sitting posture to indicate dec avoidance of L wt shift.    Time  4    Period  Weeks    Status  Achieved    Target Date  10/28/19      PT SHORT TERM GOAL #5   Title  Patient tolerates ambulation for 5 minutes over various terrain (indoor/outdoor)  without increase in pain for improved activity tolerance.    Time  4    Period  Weeks    Status  Achieved    Target Date  10/28/19        PT Long Term Goals - 10/26/19 1220      PT LONG TERM GOAL #1   Title  Patient  is independent with advanced HEP. (all LTGs target date: 11/27/2019)    Time  8    Period  Weeks    Status  New       PT LONG TERM GOAL #2   Title  Patient will decrease 5xSTS <15 seconds to demonstrate improvement in functional strength.    Time  8    Period  Weeks    Status  New      PT LONG TERM GOAL #3   Title  Pt will perform 6 minute walk test of endurance outside and increase distance to >1200' with </=2/10 pain in LLE    Time  8    Period  Weeks    Status  Revised      PT LONG TERM GOAL #4   Title  Pt will demonstrate ability to rise from the floor without pushing through hands (pt likes to sit on the floor to watch TV).    Time  8    Period  Weeks    Status  Revised    Target Date  11/27/19            Plan - 11/02/19 1303    Clinical Impression Statement  Due to patient progress treatment session focused on progressing patient's HEP to include strengthening exercises for ankles and hip muscles for increased stability, hip extensor strength and balance strategies.  Also assessed pt's strategy for rising from the floor - pt leans forwards in wide stance and pushes off UE.  Demonstrated how to perform through half kneeling but pt demonstrated increased LOB especially when pushing with LLE.  Will continue to address.  Initiated balance training on compliant surface for continued ankle and hip strategy training.  Will continue to progress towards LTG.    Personal Factors and Comorbidities  Comorbidity 3+;Past/Current Experience;Fitness;Time since onset of injury/illness/exacerbation    Comorbidities  HLD, sleep apnea, squamous cell skin CA, lumbar disc surgery (2004, 2019) due to compression fracture    Examination-Activity Limitations  Stairs;Stand;Locomotion Level    Examination-Participation Restrictions  Community Activity    Stability/Clinical Decision Making  Evolving/Moderate complexity    Rehab Potential  Good    PT Frequency  2x / week    PT Duration  8 weeks    PT Treatment/Interventions  ADLs/Self Care Home Management;Gait training;Neuromuscular re-education;Stair  training;Functional mobility training;Therapeutic activities;Therapeutic exercise;Balance training;Patient/family education;Aquatic Therapy;Cryotherapy;Moist Heat;Electrical Stimulation;DME Instruction;Orthotic Fit/Training;Manual techniques;Passive range of motion;Dry needling;Taping    PT Next Visit Plan  (Avoid flexion with rotation of spine due to h/o compression fracture and osteopenia)   Monday after aquatic - send to me for 10th visit PN.  Continue to work on balance and strength to get off floor.  Quadruped back strengthening exercises.  Balance on compliant surface.  Hip extension strength and hamstring length.    Consulted and Agree with Plan of Care  Patient       Patient will benefit from skilled therapeutic intervention in order to improve the following deficits and impairments:  Abnormal gait, Decreased strength, Impaired sensation, Decreased balance, Difficulty walking, Decreased range of motion, Decreased activity tolerance, Postural dysfunction, Pain, Impaired flexibility  Visit Diagnosis: Chronic left-sided low back pain, unspecified whether sciatica present  Muscle weakness (generalized)  Abnormal posture  Other abnormalities of gait and mobility  Repeated falls     Problem List Patient Active Problem List   Diagnosis Date Noted  . Cough 11/28/2018  . Osteoporosis with pathological fracture 04/27/2018  . Compression fracture of L3 lumbar  vertebra, closed, initial encounter ([place]) 04/05/2018  . Low back pain 03/25/2018  . Hyperglycemia 04/26/2017  . Depression 04/12/2014  . Smoker 04/12/2014  . Right sided sciatica 03/15/2013  . Habitual alcohol use 03/15/2013  . Traveling 03/15/2013  . Preventative health care 03/10/2011  . OBSTRUCTIVE SLEEP APNEA 06/04/2010  . Hyperlipidemia 10/24/2007  . Allergic rhinitis 10/24/2007    Rico Junker, PT, DPT 11/02/19    1:25 PM    [facility] [address] [place], [place], [address] Phone: [phone]   Fax:  [phone]  Name: Manuel Cruz MRN: [id number] Date of Birth: Jun 01, 1946

## 2019-11-05 ENCOUNTER — Other Ambulatory Visit: Payer: Self-pay

## 2019-11-05 ENCOUNTER — Ambulatory Visit: Payer: Medicare Other | Admitting: Physical Therapy

## 2019-11-05 ENCOUNTER — Encounter: Payer: Self-pay | Admitting: Physical Therapy

## 2019-11-05 ENCOUNTER — Ambulatory Visit: Payer: Medicare Other | Admitting: Rehabilitation

## 2019-11-05 DIAGNOSIS — R293 Abnormal posture: Secondary | ICD-10-CM

## 2019-11-05 DIAGNOSIS — M6281 Muscle weakness (generalized): Secondary | ICD-10-CM

## 2019-11-05 DIAGNOSIS — M545 Low back pain, unspecified: Secondary | ICD-10-CM

## 2019-11-05 DIAGNOSIS — R2689 Other abnormalities of gait and mobility: Secondary | ICD-10-CM | POA: Diagnosis not present

## 2019-11-05 DIAGNOSIS — R296 Repeated falls: Secondary | ICD-10-CM | POA: Diagnosis not present

## 2019-11-05 DIAGNOSIS — G8929 Other chronic pain: Secondary | ICD-10-CM

## 2019-11-05 NOTE — Therapy (Addendum)
[facility] [address] [place], [place], [address] Phone: [phone]   Fax:  [phone]  Physical Therapy Treatment and 10th visit Progress Note  Patient Details  Name: Manuel Cruz MRN: [id number] Date of Birth: 03/17/1946 Referring Provider (PT): Penni Bombard, MD   Encounter Date: 11/05/2019   Progress Note Reporting Period 09/28/2019 to 11/05/2019  See note below for Objective Data and Assessment of Progress/Goals: Pt has achieved all STG and is making fast progress towards LTG.  Pain has dramatically decreased with activity and balance is improving.  Continue with POC.  Rico Junker, PT, DPT 11/06/19    4:45 PM    PT End of Session - 11/05/19 1921    Visit Number  10    Number of Visits  17    Date for PT Re-Evaluation  11/27/19    Authorization Type  Medicare Primary/BCBS Seconday    PT Start Time  1415    PT Stop Time  1500    PT Time Calculation (min)  45 min    Equipment Utilized During Treatment  Other (comment)   buoyancy cuffs   Activity Tolerance  Patient tolerated treatment well    Behavior During Therapy  WFL for tasks assessed/performed       Past Medical History:  Diagnosis Date  . Allergic rhinitis   . Allergy   . GERD (gastroesophageal reflux disease)   . Hyperlipidemia   . Sciatica of right side    recurrent  . Sleep apnea    pt denies  . 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 Squamous cell carcinoma in situ (SCCIS) 02/01/2012   Left Sideburn (tx p bx)  . Squamous cell carcinoma in situ (SCCIS) 04/05/2016   Left Forearm(Cx3,5FU), Left Sideburn Sup(Cx3,5FU), and Left Sideburn Inf (Cx3,5FU)  . Squamous cell carcinoma in situ (SCCIS) 05/06/2016   Left Side Neck (tx p bx)  . Squamous cell skin cancer, earlobe   . Warts, genital     Past Surgical History:  Procedure Laterality Date  . COLONOSCOPY  08/12/2005  .  [place] SURGERY  2004  . [place] SURGERY  05/2018   for comression fx    There were no vitals filed for this visit.  Subjective Assessment - 11/05/19 1917    Subjective  Denies pain.  Feels like pool has helped.    Pertinent History  HLD, sleep apnea, squamous cell skin CA, lumbar disc surgery (2004, 2019)    Limitations  Walking;Standing    How long can you walk comfortably?  ~1 block    Patient Stated Goals  "get back to normal", walk without pain, throw a football out in the yard    Currently in Pain?  No/denies       Aquatic therapy at [facility] - pool temp 86.7degrees  Patient seen for aquatic therapy today. Treatment took place in water 3.5-4 feet deep depending upon activity. Pt entered and exited the poolvia step negotiation with use of handrails modified independently. Pt performed runners stretch bil sides with 30 sec hold then toes up wall for 30 second hold. Pt needs cues for technique and UE support of pool wall. .  Pt performed water walking 75m across pool forwards then backwards x 72m.  Walked 16mxforward working on [employer] then 68m backward working on [employer].Sidesteppingx59mx 2 withsquatand shoulder abd/add.  Performed bil LE hip abduction, hip flexion, hamstring curl,hip  extension and hip flexion moving directly to hip extensionsx 15 reps alternating LE's with buoyancy cuffs. Needing cues for technique.Requiring1 UE support for balance with some exercises.  Able to perform some without UE support  Heel<>toe raises x 20.Pt requiring UE support. Squats x 10 with UE support then x 10 without UE support. Seated on pool bench with ankle cuffs for bil LE LAQ x 15 and hip flexion x 15.  Pt needing UE's to maintain seated positionon bench.  Sit<>stand x 10 with 1 UE assist then x 10 without UE assist.   Lunges at pool edge with 1 UE support x 10 bil sides.  Braiding with mod-max cues to maintain technique 58m x 2.  Pt with lob at  times but recovers without assist.  Pt has decreased step size with braiding.  Pt requires the buoyancy and viscosity of the water for resistance and for support with balance exercises - pt able to safely perform balance activities without risk of falling; current of water provides mild perturbations for challenge with balance. Also used resistance of water for strengthening exercises.   PT Short Term Goals - 10/26/19 1137      PT SHORT TERM GOAL #1   Title  Patient will be independent with initial LE strengthening/balance HEP. (all STGs target date 10/28/2019)    Time  4    Period  Weeks    Status  Achieved    Target Date  10/28/19      PT SHORT TERM GOAL #2   Title  Patient will decrease 5xSTS by at least 3 seconds in order to demosntrate clinically significant improvement in LE strength.    Baseline  17 seconds    Time  4    Period  Weeks    Status  Achieved    Target Date  10/28/19      PT SHORT TERM GOAL #3   Title  Patient reports decrease in worst pain (8/10) by 2 points on the NPRS for improved pain and activity tolerance.    Baseline  Pain at worst since starting therapy: 3-4/10    Time  4    Period  Weeks    Status  Achieved    Target Date  10/28/19      PT SHORT TERM GOAL #4   Title  Patient will demosntrate midline resting/sitting posture to indicate dec avoidance of L wt shift.    Time  4    Period  Weeks    Status  Achieved    Target Date  10/28/19      PT SHORT TERM GOAL #5   Title  Patient tolerates ambulation for 5 minutes over various terrain (indoor/outdoor)  without increase in pain for improved activity tolerance.    Time  4    Period  Weeks    Status  Achieved    Target Date  10/28/19        PT Long Term Goals - 10/26/19 1220      PT LONG TERM GOAL #1   Title  Patient is independent with advanced HEP. (all LTGs target date: 11/27/2019)    Time  8    Period  Weeks    Status  New      PT LONG TERM GOAL #2   Title  Patient will decrease 5xSTS  <15 seconds to demonstrate improvement in functional strength.    Time  8    Period  Weeks    Status  New  PT LONG TERM GOAL #3   Title  Pt will perform 6 minute walk test of endurance outside and increase distance to >1200' with </=2/10 pain in LLE    Time  8    Period  Weeks    Status  Revised      PT LONG TERM GOAL #4   Title  Pt will demonstrate ability to rise from the floor without pushing through hands (pt likes to sit on the floor to watch TV).    Time  8    Period  Weeks    Status  Revised    Target Date  11/27/19            Plan - 11/05/19 1922    Clinical Impression Statement  Today was last aquatic session.  Pt's balance has improved with pool activities.  Able to do some standing exercises without holding to pool edge now along with balance activities/high level gait.  Continue PT petr POC.    Personal Factors and Comorbidities  Comorbidity 3+;Past/Current Experience;Fitness;Time since onset of injury/illness/exacerbation    Comorbidities  HLD, sleep apnea, squamous cell skin CA, lumbar disc surgery (2004, 2019) due to compression fracture    Examination-Activity Limitations  Stairs;Stand;Locomotion Level    Examination-Participation Restrictions  Community Activity    Stability/Clinical Decision Making  Evolving/Moderate complexity    Rehab Potential  Good    PT Frequency  2x / week    PT Duration  8 weeks    PT Treatment/Interventions  ADLs/Self Care Home Management;Gait training;Neuromuscular re-education;Stair training;Functional mobility training;Therapeutic activities;Therapeutic exercise;Balance training;Patient/family education;Aquatic Therapy;Cryotherapy;Moist Heat;Electrical Stimulation;DME Instruction;Orthotic Fit/Training;Manual techniques;Passive range of motion;Dry needling;Taping    PT Next Visit Plan  (Avoid flexion with rotation of spine due to h/o compression fracture and osteopenia)   Continue to work on balance and strength to get off floor.   Quadruped back strengthening exercises.  Balance on compliant surface.  Hip extension strength and hamstring length.    Consulted and Agree with Plan of Care  Patient       Patient will benefit from skilled therapeutic intervention in order to improve the following deficits and impairments:  Abnormal gait, Decreased strength, Impaired sensation, Decreased balance, Difficulty walking, Decreased range of motion, Decreased activity tolerance, Postural dysfunction, Pain, Impaired flexibility  Visit Diagnosis: Chronic left-sided low back pain, unspecified whether sciatica present  Muscle weakness (generalized)  Abnormal posture  Other abnormalities of gait and mobility     Problem List Patient Active Problem List   Diagnosis Date Noted  . Cough 11/28/2018  . Osteoporosis with pathological fracture 04/27/2018  . Compression fracture of L3 lumbar vertebra, closed, initial encounter ([place]) 04/05/2018  . Low back pain 03/25/2018  . Hyperglycemia 04/26/2017  . Depression 04/12/2014  . Smoker 04/12/2014  . Right sided sciatica 03/15/2013  . Habitual alcohol use 03/15/2013  . Traveling 03/15/2013  . Preventative health care 03/10/2011  . OBSTRUCTIVE SLEEP APNEA 06/04/2010  . Hyperlipidemia 10/24/2007  . Allergic rhinitis 10/24/2007    Narda Bonds, PTA [place] 11/05/19 7:31 PM Phone: [phone] Fax: [facility] [address] [place], [place], [address] Phone: [phone]   Fax:  [phone]  Name: DAREON SORDEN MRN: [id number] Date of Birth: Oct 27, 1945

## 2019-11-07 ENCOUNTER — Ambulatory Visit: Payer: Medicare Other

## 2019-11-09 ENCOUNTER — Ambulatory Visit: Payer: Medicare Other | Admitting: Physical Therapy

## 2019-11-09 ENCOUNTER — Other Ambulatory Visit: Payer: Self-pay

## 2019-11-09 ENCOUNTER — Encounter: Payer: Self-pay | Admitting: Physical Therapy

## 2019-11-09 DIAGNOSIS — M545 Low back pain, unspecified: Secondary | ICD-10-CM

## 2019-11-09 DIAGNOSIS — G8929 Other chronic pain: Secondary | ICD-10-CM | POA: Diagnosis not present

## 2019-11-09 DIAGNOSIS — R296 Repeated falls: Secondary | ICD-10-CM

## 2019-11-09 DIAGNOSIS — R293 Abnormal posture: Secondary | ICD-10-CM

## 2019-11-09 DIAGNOSIS — R2689 Other abnormalities of gait and mobility: Secondary | ICD-10-CM

## 2019-11-09 DIAGNOSIS — M6281 Muscle weakness (generalized): Secondary | ICD-10-CM

## 2019-11-09 NOTE — Therapy (Signed)
[facility] [address] [place], [place], [address] Phone: [phone]   Fax:  [phone]  Physical Therapy Treatment  Patient Details  Name: Manuel Cruz MRN: [id number] Date of Birth: 01-03-1946 Referring Provider (PT): Penni Bombard, MD   Encounter Date: 11/09/2019  PT End of Session - 11/09/19 1244    Visit Number  11    Number of Visits  17    Date for PT Re-Evaluation  11/27/19    Authorization Type  Medicare Primary/BCBS Seconday    PT Start Time  1150    PT Stop Time  1230    PT Time Calculation (min)  40 min    Activity Tolerance  Patient tolerated treatment well    Behavior During Therapy  [facility] for tasks assessed/performed       Past Medical History:  Diagnosis Date  . Allergic rhinitis   . Allergy   . GERD (gastroesophageal reflux disease)   . Hyperlipidemia   . Sciatica of right side    recurrent  . Sleep apnea    pt denies  . 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 Squamous cell carcinoma in situ (SCCIS) 02/01/2012   Left Sideburn (tx p bx)  . Squamous cell carcinoma in situ (SCCIS) 04/05/2016   Left Forearm(Cx3,5FU), Left Sideburn Sup(Cx3,5FU), and Left Sideburn Inf (Cx3,5FU)  . Squamous cell carcinoma in situ (SCCIS) 05/06/2016   Left Side Neck (tx p bx)  . Squamous cell skin cancer, earlobe   . Warts, genital     Past Surgical History:  Procedure Laterality Date  . COLONOSCOPY  08/12/2005  . [place] SURGERY  2004  . [place] SURGERY  05/2018   for comression fx    There were no vitals filed for this visit.  Subjective Assessment - 11/09/19 1152    Subjective  Doing well, finished with aquatic this week.  No issues or changes to report    Pertinent History  HLD, sleep apnea, squamous cell skin CA, lumbar disc surgery (2004, 2019)    Limitations  Walking;Standing    How long can you walk comfortably?  ~1 block    Patient Stated Goals  "get back to normal", walk without pain, throw a football out in the yard    Currently in Pain?  No/denies                       [facility] Adult PT Treatment/Exercise - 11/09/19 1207      Exercises   Exercises  Other Exercises    Other Exercises   Postural and hamstring strengthening exercises: with 5lb weights in bilat hands performed 2 sets x 8 reps hamstring dead lifts with cues for thoracic extension and hinging at hips.  Also performed bend over rows 2 sets x 8 reps with cues for technique and shoulder/scapular retraction.  Transitioned to supine and performed posterior pelvic tilt training for core activation x 8 reps.  Performed bilat UE flexion overhead holding 5lb weight x 8 reps to facilitate thoracic extension; combined overhead flexion with mini-bridge - 2 sets x 5 reps.  Performed posterior pelvic tilt with contralateral UE and LE flexion (reverse dead bug) x 6 reps each side with therapist providing max cues for technique and sequencing.               PT Short Term Goals - 10/26/19 1137  PT SHORT TERM GOAL #1   Title  Patient will be independent with initial LE strengthening/balance HEP. (all STGs target date 10/28/2019)    Time  4    Period  Weeks    Status  Achieved    Target Date  10/28/19      PT SHORT TERM GOAL #2   Title  Patient will decrease 5xSTS by at least 3 seconds in order to demosntrate clinically significant improvement in LE strength.    Baseline  17 seconds    Time  4    Period  Weeks    Status  Achieved    Target Date  10/28/19      PT SHORT TERM GOAL #3   Title  Patient reports decrease in worst pain (8/10) by 2 points on the NPRS for improved pain and activity tolerance.    Baseline  Pain at worst since starting therapy: 3-4/10    Time  4    Period  Weeks    Status  Achieved    Target Date  10/28/19      PT SHORT TERM GOAL #4   Title  Patient will demosntrate midline resting/sitting posture to  indicate dec avoidance of L wt shift.    Time  4    Period  Weeks    Status  Achieved    Target Date  10/28/19      PT SHORT TERM GOAL #5   Title  Patient tolerates ambulation for 5 minutes over various terrain (indoor/outdoor)  without increase in pain for improved activity tolerance.    Time  4    Period  Weeks    Status  Achieved    Target Date  10/28/19        PT Long Term Goals - 10/26/19 1220      PT LONG TERM GOAL #1   Title  Patient is independent with advanced HEP. (all LTGs target date: 11/27/2019)    Time  8    Period  Weeks    Status  New      PT LONG TERM GOAL #2   Title  Patient will decrease 5xSTS <15 seconds to demonstrate improvement in functional strength.    Time  8    Period  Weeks    Status  New      PT LONG TERM GOAL #3   Title  Pt will perform 6 minute walk test of endurance outside and increase distance to >1200' with </=2/10 pain in LLE    Time  8    Period  Weeks    Status  Revised      PT LONG TERM GOAL #4   Title  Pt will demonstrate ability to rise from the floor without pushing through hands (pt likes to sit on the floor to watch TV).    Time  8    Period  Weeks    Status  Revised    Target Date  11/27/19            Plan - 11/09/19 1245    Clinical Impression Statement  Treatment session focused on progressive strengthening of upper back, core and hamstring muscles in standing and supine focusing on extension and proximal stabilization.  Pt required max-total verbal and visual cues from therapist for correct muscle activation, technique and sequencing.  No pain reported while performing.  Will continued to address in order to progress towards LTG.    Personal Factors and Comorbidities  Comorbidity 3+;Past/Current Experience;Fitness;Time  since onset of injury/illness/exacerbation    Comorbidities  HLD, sleep apnea, squamous cell skin CA, lumbar disc surgery (2004, 2019) due to compression fracture    Examination-Activity Limitations   Stairs;Stand;Locomotion Level    Examination-Participation Restrictions  Community Activity    Stability/Clinical Decision Making  Evolving/Moderate complexity    Rehab Potential  Good    PT Frequency  2x / week    PT Duration  8 weeks    PT Treatment/Interventions  ADLs/Self Care Home Management;Gait training;Neuromuscular re-education;Stair training;Functional mobility training;Therapeutic activities;Therapeutic exercise;Balance training;Patient/family education;Aquatic Therapy;Cryotherapy;Moist Heat;Electrical Stimulation;DME Instruction;Orthotic Fit/Training;Manual techniques;Passive range of motion;Dry needling;Taping    PT Next Visit Plan  (Avoid flexion with rotation of spine due to h/o compression fracture and osteopenia)   Continue to work on balance and strength to get off floor. Postural, back and hip strengthening exercises focusing on extension.  Balance on compliant surface    Consulted and Agree with Plan of Care  Patient       Patient will benefit from skilled therapeutic intervention in order to improve the following deficits and impairments:  Abnormal gait, Decreased strength, Impaired sensation, Decreased balance, Difficulty walking, Decreased range of motion, Decreased activity tolerance, Postural dysfunction, Pain, Impaired flexibility  Visit Diagnosis: Chronic left-sided low back pain, unspecified whether sciatica present  Muscle weakness (generalized)  Abnormal posture  Other abnormalities of gait and mobility  Repeated falls     Problem List Patient Active Problem List   Diagnosis Date Noted  . Cough 11/28/2018  . Osteoporosis with pathological fracture 04/27/2018  . Compression fracture of L3 lumbar vertebra, closed, initial encounter ([place]) 04/05/2018  . Low back pain 03/25/2018  . Hyperglycemia 04/26/2017  . Depression 04/12/2014  . Smoker 04/12/2014  . Right sided sciatica 03/15/2013  . Habitual alcohol use 03/15/2013  . Traveling 03/15/2013  .  Preventative health care 03/10/2011  . OBSTRUCTIVE SLEEP APNEA 06/04/2010  . Hyperlipidemia 10/24/2007  . Allergic rhinitis 10/24/2007    Rico Junker, PT, DPT 11/09/19    12:57 PM    [facility] [address] [place], [place], [address] Phone: [phone]   Fax:  [phone]  Name: Manuel Cruz MRN: [id number] Date of Birth: 01-15-1946

## 2019-11-12 ENCOUNTER — Ambulatory Visit: Payer: Medicare Other | Admitting: Rehabilitation

## 2019-11-15 ENCOUNTER — Other Ambulatory Visit: Payer: Self-pay

## 2019-11-15 ENCOUNTER — Encounter: Payer: Self-pay | Admitting: Physical Therapy

## 2019-11-15 ENCOUNTER — Ambulatory Visit: Payer: Medicare Other | Attending: Diagnostic Neuroimaging | Admitting: Physical Therapy

## 2019-11-15 DIAGNOSIS — M6281 Muscle weakness (generalized): Secondary | ICD-10-CM | POA: Diagnosis not present

## 2019-11-15 DIAGNOSIS — R2689 Other abnormalities of gait and mobility: Secondary | ICD-10-CM | POA: Insufficient documentation

## 2019-11-15 DIAGNOSIS — G8929 Other chronic pain: Secondary | ICD-10-CM | POA: Diagnosis not present

## 2019-11-15 DIAGNOSIS — R293 Abnormal posture: Secondary | ICD-10-CM | POA: Diagnosis not present

## 2019-11-15 DIAGNOSIS — R296 Repeated falls: Secondary | ICD-10-CM | POA: Insufficient documentation

## 2019-11-15 DIAGNOSIS — M545 Low back pain, unspecified: Secondary | ICD-10-CM

## 2019-11-15 NOTE — Therapy (Signed)
[facility] [address] [place], [place], [address] Phone: [phone]   Fax:  [phone]  Physical Therapy Treatment  Patient Details  Name: Manuel Cruz MRN: [id number] Date of Birth: 09-30-1945 Referring Provider (PT): Penni Bombard, MD   Encounter Date: 11/15/2019  PT End of Session - 11/15/19 1154    Visit Number  12    Number of Visits  17    Date for PT Re-Evaluation  11/27/19    Authorization Type  Medicare Primary/BCBS Seconday    PT Start Time  1107    PT Stop Time  1145    PT Time Calculation (min)  38 min    Activity Tolerance  Patient tolerated treatment well    Behavior During Therapy  [facility] for tasks assessed/performed       Past Medical History:  Diagnosis Date  . Allergic rhinitis   . Allergy   . GERD (gastroesophageal reflux disease)   . Hyperlipidemia   . Sciatica of right side    recurrent  . Sleep apnea    pt denies  . 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 Squamous cell carcinoma in situ (SCCIS) 02/01/2012   Left Sideburn (tx p bx)  . Squamous cell carcinoma in situ (SCCIS) 04/05/2016   Left Forearm(Cx3,5FU), Left Sideburn Sup(Cx3,5FU), and Left Sideburn Inf (Cx3,5FU)  . Squamous cell carcinoma in situ (SCCIS) 05/06/2016   Left Side Neck (tx p bx)  . Squamous cell skin cancer, earlobe   . Warts, genital     Past Surgical History:  Procedure Laterality Date  . COLONOSCOPY  08/12/2005  . [place] SURGERY  2004  . [place] SURGERY  05/2018   for comression fx    There were no vitals filed for this visit.  Subjective Assessment - 11/15/19 1109    Subjective  Was a little sore after last session but not bad.  Had a little pain in his L thihg yesterday - started spontaneously and then resolved spontaneously.    Pertinent History  HLD, sleep apnea, squamous cell skin CA, lumbar disc surgery (2004, 2019)    Limitations   Walking;Standing    How long can you walk comfortably?  ~1 block    Patient Stated Goals  "get back to normal", walk without pain, throw a football out in the yard    Currently in Pain?  No/denies                       [facility] Adult PT Treatment/Exercise - 11/15/19 1123      Transfers   Transfers  Floor to Transfer    Floor to Transfer  4: Min assist    Floor to Transfer Details (indicate cue type and reason)  started in tall kneeling on mat performed 3 reps each side transitioning tall > 1/2 kneel > staggered stand > stand and then reversing order to return to tall kneeling on mat.  Min A for balance and support when pushing up on LLE due to weakness.      Exercises   Exercises  Lumbar      Lumbar Exercises: Stretches   Single Knee to Chest Stretch  Right;Left;4 reps;10 seconds    Double Knee to Chest Stretch  3 reps;10 seconds    Lower Trunk Rotation  5 reps      Knee/Hip Exercises: Stretches   Hip Flexor Stretch  Right;Left;2 reps;10 seconds    Hip Flexor Stretch Limitations  standing runner's stretch at counter      Knee/Hip Exercises: Standing   Forward Lunges  Right;Left;1 set;5 reps    Forward Lunges Limitations  staggered stance, static lunges - knee down to mat and then back up with one UE support on mat.          Balance Exercises - 11/15/19 1130      Balance Exercises: Standing   Standing Eyes Opened  Narrow base of support (BOS);Foam/compliant surface;Other reps (comment);Head turns   10 reps head turns/nods, no UE support   Standing Eyes Closed  Narrow base of support (BOS);Head turns;Foam/compliant surface;Other reps (comment)   10 reps head turns/nods; one UE spuport       PT Education - 11/15/19 1151    Education Details  continued floor to stand transfer training and educated pt on focus of final two session    Person(s) Educated  Patient    Methods  Explanation    Comprehension  Verbalized understanding       PT Short Term Goals -  10/26/19 1137      PT SHORT TERM GOAL #1   Title  Patient will be independent with initial LE strengthening/balance HEP. (all STGs target date 10/28/2019)    Time  4    Period  Weeks    Status  Achieved    Target Date  10/28/19      PT SHORT TERM GOAL #2   Title  Patient will decrease 5xSTS by at least 3 seconds in order to demosntrate clinically significant improvement in LE strength.    Baseline  17 seconds    Time  4    Period  Weeks    Status  Achieved    Target Date  10/28/19      PT SHORT TERM GOAL #3   Title  Patient reports decrease in worst pain (8/10) by 2 points on the NPRS for improved pain and activity tolerance.    Baseline  Pain at worst since starting therapy: 3-4/10    Time  4    Period  Weeks    Status  Achieved    Target Date  10/28/19      PT SHORT TERM GOAL #4   Title  Patient will demosntrate midline resting/sitting posture to indicate dec avoidance of L wt shift.    Time  4    Period  Weeks    Status  Achieved    Target Date  10/28/19      PT SHORT TERM GOAL #5   Title  Patient tolerates ambulation for 5 minutes over various terrain (indoor/outdoor)  without increase in pain for improved activity tolerance.    Time  4    Period  Weeks    Status  Achieved    Target Date  10/28/19        PT Long Term Goals - 10/26/19 1220      PT LONG TERM GOAL #1   Title  Patient is independent with advanced HEP. (all LTGs target date: 11/27/2019)    Time  8    Period  Weeks    Status  New      PT LONG TERM GOAL #2   Title  Patient will decrease 5xSTS <15 seconds to demonstrate improvement in functional strength.    Time  8    Period  Weeks    Status  New      PT  LONG TERM GOAL #3   Title  Pt will perform 6 minute walk test of endurance outside and increase distance to >1200' with </=2/10 pain in LLE    Time  8    Period  Weeks    Status  Revised      PT LONG TERM GOAL #4   Title  Pt will demonstrate ability to rise from the floor without pushing  through hands (pt likes to sit on the floor to watch TV).    Time  8    Period  Weeks    Status  Revised    Target Date  11/27/19            Plan - 11/15/19 1155    Clinical Impression Statement  Treatment session continued to focus on LE strengthening and ROM to facilitate safe floor > stand transfers through half kneeling > stand - still has increased difficulty pushing through LLE and experiences significant mm fatigue in LLE.  Continued to progress balance training on compliant surfaces with pt continuing to require UE support when eyes closed.  Pt is progressing well; will check LTG next session and plan to D/C the session after that.  Pt agreeable to plan.    Personal Factors and Comorbidities  Comorbidity 3+;Past/Current Experience;Fitness;Time since onset of injury/illness/exacerbation    Comorbidities  HLD, sleep apnea, squamous cell skin CA, lumbar disc surgery (2004, 2019) due to compression fracture    Examination-Activity Limitations  Stairs;Stand;Locomotion Level    Examination-Participation Restrictions  Community Activity    Stability/Clinical Decision Making  Evolving/Moderate complexity    Rehab Potential  Good    PT Frequency  2x / week    PT Duration  8 weeks    PT Treatment/Interventions  ADLs/Self Care Home Management;Gait training;Neuromuscular re-education;Stair training;Functional mobility training;Therapeutic activities;Therapeutic exercise;Balance training;Patient/family education;Aquatic Therapy;Cryotherapy;Moist Heat;Electrical Stimulation;DME Instruction;Orthotic Fit/Training;Manual techniques;Passive range of motion;Dry needling;Taping    PT Next Visit Plan  check LTG; finalize HEP - runner's stretch, static lunges    Consulted and Agree with Plan of Care  Patient       Patient will benefit from skilled therapeutic intervention in order to improve the following deficits and impairments:  Abnormal gait, Decreased strength, Impaired sensation, Decreased  balance, Difficulty walking, Decreased range of motion, Decreased activity tolerance, Postural dysfunction, Pain, Impaired flexibility  Visit Diagnosis: Chronic left-sided low back pain, unspecified whether sciatica present  Muscle weakness (generalized)  Abnormal posture  Other abnormalities of gait and mobility  Repeated falls     Problem List Patient Active Problem List   Diagnosis Date Noted  . Cough 11/28/2018  . Osteoporosis with pathological fracture 04/27/2018  . Compression fracture of L3 lumbar vertebra, closed, initial encounter ([place]) 04/05/2018  . Low back pain 03/25/2018  . Hyperglycemia 04/26/2017  . Depression 04/12/2014  . Smoker 04/12/2014  . Right sided sciatica 03/15/2013  . Habitual alcohol use 03/15/2013  . Traveling 03/15/2013  . Preventative health care 03/10/2011  . OBSTRUCTIVE SLEEP APNEA 06/04/2010  . Hyperlipidemia 10/24/2007  . Allergic rhinitis 10/24/2007    Rico Junker, PT, DPT 11/15/19    12:03 PM    [facility] [address] [place], [place], [address] Phone: [phone]   Fax:  [phone]  Name: Manuel Cruz MRN: [id number] Date of Birth: 16-Dec-1945

## 2019-11-20 ENCOUNTER — Ambulatory Visit (INDEPENDENT_AMBULATORY_CARE_PROVIDER_SITE_OTHER): Payer: Medicare Other | Admitting: Internal Medicine

## 2019-11-20 ENCOUNTER — Encounter: Payer: Self-pay | Admitting: Internal Medicine

## 2019-11-20 ENCOUNTER — Other Ambulatory Visit: Payer: Self-pay

## 2019-11-20 VITALS — BP 142/82 | HR 76 | Temp 97.8°F | Ht 68.0 in | Wt 156.0 lb

## 2019-11-20 DIAGNOSIS — E611 Iron deficiency: Secondary | ICD-10-CM | POA: Diagnosis not present

## 2019-11-20 DIAGNOSIS — R739 Hyperglycemia, unspecified: Secondary | ICD-10-CM

## 2019-11-20 DIAGNOSIS — E785 Hyperlipidemia, unspecified: Secondary | ICD-10-CM

## 2019-11-20 DIAGNOSIS — E538 Deficiency of other specified B group vitamins: Secondary | ICD-10-CM

## 2019-11-20 DIAGNOSIS — I2510 Atherosclerotic heart disease of native coronary artery without angina pectoris: Secondary | ICD-10-CM

## 2019-11-20 DIAGNOSIS — E559 Vitamin D deficiency, unspecified: Secondary | ICD-10-CM | POA: Diagnosis not present

## 2019-11-20 DIAGNOSIS — F17200 Nicotine dependence, unspecified, uncomplicated: Secondary | ICD-10-CM

## 2019-11-20 DIAGNOSIS — N320 Bladder-neck obstruction: Secondary | ICD-10-CM | POA: Diagnosis not present

## 2019-11-20 LAB — HEPATIC FUNCTION PANEL
ALT: 67 U/L — ABNORMAL HIGH (ref 0–53)
AST: 63 U/L — ABNORMAL HIGH (ref 0–37)
Albumin: 3.9 g/dL (ref 3.5–5.2)
Alkaline Phosphatase: 80 U/L (ref 39–117)
Bilirubin, Direct: 0.2 mg/dL (ref 0.0–0.3)
Total Bilirubin: 0.7 mg/dL (ref 0.2–1.2)
Total Protein: 6.9 g/dL (ref 6.0–8.3)

## 2019-11-20 LAB — CBC WITH DIFFERENTIAL/PLATELET
Basophils Absolute: 0.1 10*3/uL (ref 0.0–0.1)
Basophils Relative: 1.3 % (ref 0.0–3.0)
Eosinophils Absolute: 0.2 10*3/uL (ref 0.0–0.7)
Eosinophils Relative: 3.1 % (ref 0.0–5.0)
HCT: 43 % (ref 39.0–52.0)
Hemoglobin: 14.7 g/dL (ref 13.0–17.0)
Lymphocytes Relative: 40.1 % (ref 12.0–46.0)
Lymphs Abs: 2.5 10*3/uL (ref 0.7–4.0)
MCHC: 34.1 g/dL (ref 30.0–36.0)
MCV: 105.3 fl — ABNORMAL HIGH (ref 78.0–100.0)
Monocytes Absolute: 0.8 10*3/uL (ref 0.1–1.0)
Monocytes Relative: 13.5 % — ABNORMAL HIGH (ref 3.0–12.0)
Neutro Abs: 2.6 10*3/uL (ref 1.4–7.7)
Neutrophils Relative %: 42 % — ABNORMAL LOW (ref 43.0–77.0)
Platelets: 233 10*3/uL (ref 150.0–400.0)
RBC: 4.09 Mil/uL — ABNORMAL LOW (ref 4.22–5.81)
RDW: 13.6 % (ref 11.5–15.5)
WBC: 6.2 10*3/uL (ref 4.0–10.5)

## 2019-11-20 LAB — URINALYSIS, ROUTINE W REFLEX MICROSCOPIC
Bilirubin Urine: NEGATIVE
Hgb urine dipstick: NEGATIVE
Ketones, ur: NEGATIVE
Leukocytes,Ua: NEGATIVE
Nitrite: NEGATIVE
RBC / HPF: NONE SEEN (ref 0–?)
Specific Gravity, Urine: >=1.03 — AB (ref 1.000–1.030)
Total Protein, Urine: NEGATIVE
Urine Glucose: NEGATIVE
Urobilinogen, UA: 0.2 (ref 0.0–1.0)
WBC, UA: NONE SEEN (ref 0–?)
pH: 5 (ref 5.0–8.0)

## 2019-11-20 LAB — VITAMIN B12: Vitamin B-12: 368 pg/mL (ref 211–911)

## 2019-11-20 LAB — LIPID PANEL
Cholesterol: 158 mg/dL (ref 0–200)
HDL: 79 mg/dL (ref >39.00)
NonHDL: 79.04
Total CHOL/HDL Ratio: 2
Triglycerides: 252 mg/dL — ABNORMAL HIGH (ref 0.0–149.0)
VLDL: 50.4 mg/dL — ABNORMAL HIGH (ref 0.0–40.0)

## 2019-11-20 LAB — BASIC METABOLIC PANEL
BUN: 15 mg/dL (ref 6–23)
CO2: 27 mEq/L (ref 19–32)
Calcium: 9.3 mg/dL (ref 8.4–10.5)
Chloride: 104 mEq/L (ref 96–112)
Creatinine, Ser: 0.8 mg/dL (ref 0.40–1.50)
GFR: 94.94 mL/min (ref >60.00)
Glucose, Bld: 92 mg/dL (ref 70–99)
Potassium: 4.6 mEq/L (ref 3.5–5.1)
Sodium: 138 mEq/L (ref 135–145)

## 2019-11-20 LAB — VITAMIN D 25 HYDROXY (VIT D DEFICIENCY, FRACTURES): VITD: 57.12 ng/mL (ref 30.00–100.00)

## 2019-11-20 LAB — IBC PANEL
Iron: 196 ug/dL — ABNORMAL HIGH (ref 42–165)
Saturation Ratios: 63.1 % — ABNORMAL HIGH (ref 20.0–50.0)
Transferrin: 222 mg/dL (ref 212.0–360.0)

## 2019-11-20 LAB — TSH: TSH: 1.66 u[IU]/mL (ref 0.35–4.50)

## 2019-11-20 LAB — HEMOGLOBIN A1C: Hgb A1c MFr Bld: 5.6 % (ref 4.6–6.5)

## 2019-11-20 LAB — LDL CHOLESTEROL, DIRECT: Direct LDL: 54 mg/dL

## 2019-11-20 LAB — PSA: PSA: 1.24 ng/mL (ref 0.10–4.00)

## 2019-11-20 NOTE — Assessment & Plan Note (Signed)
Counseled to quit, pt not ready 

## 2019-11-20 NOTE — Assessment & Plan Note (Addendum)
stable overall by history and exam, recent data reviewed with pt, and pt to continue medical treatment as before,  to f/u any worsening symptoms or concerns, for lower chol diet  I spent 30 minutes in preparing to see the patient by review of recent labs, imaging and procedures, obtaining and reviewing separately obtained history, communicating with the patient and family or caregiver, ordering medications, tests or procedures, and documenting clinical information in the EHR including the differential Dx, treatment, and any further evaluation and other management of CAD, HLD, hyperglycemia, smoking

## 2019-11-20 NOTE — Patient Instructions (Signed)
Ok to increase the lipitor to 80 mg per day  Please stop smoking  Please continue all other medications as before, and refills have been done if requested.  Please have the pharmacy call with any other refills you may need.  Please continue your efforts at being more active, low cholesterol diet, and weight control.  You are otherwise up to date with prevention measures today.  Please keep your appointments with your specialists as you may have planned  Please go to the LAB at the blood drawing area for the tests to be done  You will be contacted by phone if any changes need to be made immediately.  Otherwise, you will receive a letter about your results with an explanation, but please check with MyChart first.  Please remember to sign up for MyChart if you have not done so, as this will be important to you in the future with finding out test results, communicating by private email, and scheduling acute appointments online when needed.  Please make an Appointment to return in 6 months, or sooner if needed

## 2019-11-20 NOTE — Assessment & Plan Note (Signed)
stable overall by history and exam, recent data reviewed with pt, and pt to continue medical treatment as before,  to f/u any worsening symptoms or concerns, for contd high dose statin

## 2019-11-20 NOTE — Progress Notes (Signed)
Subjective:    Patient ID: Manuel Cruz, male    DOB: [birth year], 74 y.o.   MRN: [id number]  HPI  Here to f/u; overall doing ok,  Pt denies chest pain, increasing sob or doe, wheezing, orthopnea, PND, increased LE swelling, palpitations, dizziness or syncope.  Pt denies new neurological symptoms such as new headache, or facial or extremity weakness or numbness.  Pt denies polydipsia, polyuria, or low sugar episode.  Pt states overall good compliance with meds, mostly trying to follow appropriate diet, with wt overall stable,  but little exercise however.  Had significant sciatica, saw neurology Dr Leta Baptist and referred for PT, 2 more visits and seems to be helping. Still smoking, not ready to quit.   Past Medical History:  Diagnosis Date  . Allergic rhinitis   . Allergy   . GERD (gastroesophageal reflux disease)   . Hyperlipidemia   . Sciatica of right side    recurrent  . Sleep apnea    pt denies  . 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 Squamous cell carcinoma in situ (SCCIS) 02/01/2012   Left Sideburn (tx p bx)  . Squamous cell carcinoma in situ (SCCIS) 04/05/2016   Left Forearm(Cx3,5FU), Left Sideburn Sup(Cx3,5FU), and Left Sideburn Inf (Cx3,5FU)  . Squamous cell carcinoma in situ (SCCIS) 05/06/2016   Left Side Neck (tx p bx)  . Squamous cell skin cancer, earlobe   . Warts, genital    Past Surgical History:  Procedure Laterality Date  . COLONOSCOPY  08/12/2005  . [place] SURGERY  2004  . [place] SURGERY  05/2018   for comression fx    reports that he has been smoking cigarettes. He has been smoking about 0.25 packs per day. He has never used smokeless tobacco. He reports current alcohol use of about 10.0 standard drinks of alcohol per week. He reports that he does not use drugs. family history includes Cancer in his mother; Hyperlipidemia in his brother and father. No Known Allergies Current Outpatient Medications  on File Prior to Visit  Medication Sig Dispense Refill  . aspirin 81 MG EC tablet Take 1 tablet (81 mg total) by mouth daily. 90 tablet 1  . atorvastatin (LIPITOR) 80 MG tablet TAKE 1 TABLET DAILY 90 tablet 3  . b complex vitamins tablet Take 1 tablet by mouth daily.    . calcitonin, salmon, (MIACALCIN/FORTICAL) 200 UNIT/ACT nasal spray PLACE 1 SPRAY INTO ALTERNATE NOSTRILS DAILY. 3.7 mL 1  . Carboxymeth-Glycerin-Polysorb (REFRESH OPTIVE ADVANCED OP) Apply 1 drop to eye 2 (two) times daily.    . Cetirizine HCl (ZYRTEC ALLERGY) 10 MG CAPS Take 1 capsule (10 mg total) by mouth as needed. 90 capsule 3  . gabapentin (NEURONTIN) 300 MG capsule TAKE 2 CAPSULES 3 TIMES A  DAY 540 capsule 0  . glucosamine-chondroitin 500-400 MG tablet Take 1 tablet by mouth 3 (three) times daily.    Marland Kitchen ketotifen (ZADITOR) 0.025 % ophthalmic solution Place 1 drop into both eyes 2 (two) times daily.    . Melatonin 3 MG TABS Take by mouth.    . Multiple Vitamin (MULTIVITAMIN) tablet Take 1 tablet by mouth daily.    . naproxen (NAPROSYN) 250 MG tablet Take by mouth 2 (two) times daily with a meal.    . omeprazole (PRILOSEC) 20 MG capsule Take 1 capsule (20 mg total) by mouth daily. 90 capsule 3  . Probiotic Product (PROBIOTIC PO) Take by mouth.    Marland Kitchen  traMADol (ULTRAM) 50 MG tablet Take by mouth every 6 (six) hours as needed.     No current facility-administered medications on file prior to visit.   Review of Systems All otherwise neg per pt     Objective:   Physical Exam BP (!) 142/82   Pulse 76   Temp 97.8 F (36.6 C)   Ht 5\' 8"  (1.727 m)   Wt 156 lb (70.8 kg)   SpO2 100%   BMI 23.72 kg/m  VS noted,  Constitutional: Pt appears in NAD HENT: Head: NCAT.  Right Ear: External ear normal.  Left Ear: External ear normal.  Eyes: . Pupils are equal, round, and reactive to light. Conjunctivae and EOM are normal Nose: without d/c or deformity Neck: Neck supple. Gross normal ROM Cardiovascular: Normal rate and  regular rhythm.   Pulmonary/Chest: Effort normal and breath sounds without rales or wheezing.  Abd:  Soft, NT, ND, + BS, no organomegaly Neurological: Pt is alert. At baseline orientation, motor grossly intact Skin: Skin is warm. No rashes, other new lesions, no LE edema Psychiatric: Pt behavior is normal without agitation  All otherwise neg per pt Lab Results  Component Value Date   WBC 6.2 11/20/2019   HGB 14.7 11/20/2019   HCT 43.0 11/20/2019   PLT 233.0 11/20/2019   GLUCOSE 92 11/20/2019   CHOL 158 11/20/2019   TRIG 252.0 (H) 11/20/2019   HDL 79.00 11/20/2019   LDLDIRECT 54.0 11/20/2019   LDLCALC 80 11/08/2018   ALT 67 (H) 11/20/2019   AST 63 (H) 11/20/2019   NA 138 11/20/2019   K 4.6 11/20/2019   CL 104 11/20/2019   CREATININE 0.80 11/20/2019   BUN 15 11/20/2019   CO2 27 11/20/2019   TSH 1.66 11/20/2019   PSA 1.24 11/20/2019   HGBA1C 5.6 11/20/2019      Assessment & Plan:

## 2019-11-20 NOTE — Assessment & Plan Note (Signed)
stable overall by history and exam, recent data reviewed with pt, and pt to continue medical treatment as before,  to f/u any worsening symptoms or concerns  

## 2019-11-21 ENCOUNTER — Encounter: Payer: Self-pay | Admitting: Physical Therapy

## 2019-11-21 ENCOUNTER — Other Ambulatory Visit: Payer: Self-pay

## 2019-11-21 ENCOUNTER — Ambulatory Visit: Payer: Medicare Other | Admitting: Physical Therapy

## 2019-11-21 DIAGNOSIS — R2689 Other abnormalities of gait and mobility: Secondary | ICD-10-CM | POA: Diagnosis not present

## 2019-11-21 DIAGNOSIS — R293 Abnormal posture: Secondary | ICD-10-CM | POA: Diagnosis not present

## 2019-11-21 DIAGNOSIS — M545 Low back pain, unspecified: Secondary | ICD-10-CM

## 2019-11-21 DIAGNOSIS — M6281 Muscle weakness (generalized): Secondary | ICD-10-CM | POA: Diagnosis not present

## 2019-11-21 DIAGNOSIS — R296 Repeated falls: Secondary | ICD-10-CM

## 2019-11-21 DIAGNOSIS — G8929 Other chronic pain: Secondary | ICD-10-CM

## 2019-11-21 NOTE — Therapy (Signed)
[facility] [address] [place], [place], [address] Phone: [phone]   Fax:  [phone]  Physical Therapy Treatment  Patient Details  Name: Manuel Cruz MRN: [id number] Date of Birth: Jan 01, 1946 Referring Provider (PT): Penni Bombard, MD   Encounter Date: 11/21/2019  PT End of Session - 11/21/19 1409    Visit Number  13    Number of Visits  17    Date for PT Re-Evaluation  11/27/19    Authorization Type  Medicare Primary/BCBS Seconday    PT Start Time  1230    PT Stop Time  1317    PT Time Calculation (min)  47 min    Activity Tolerance  Patient tolerated treatment well    Behavior During Therapy  [facility] for tasks assessed/performed       Past Medical History:  Diagnosis Date  . Allergic rhinitis   . Allergy   . GERD (gastroesophageal reflux disease)   . Hyperlipidemia   . Sciatica of right side    recurrent  . Sleep apnea    pt denies  . 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 Squamous cell carcinoma in situ (SCCIS) 02/01/2012   Left Sideburn (tx p bx)  . Squamous cell carcinoma in situ (SCCIS) 04/05/2016   Left Forearm(Cx3,5FU), Left Sideburn Sup(Cx3,5FU), and Left Sideburn Inf (Cx3,5FU)  . Squamous cell carcinoma in situ (SCCIS) 05/06/2016   Left Side Neck (tx p bx)  . Squamous cell skin cancer, earlobe   . Warts, genital     Past Surgical History:  Procedure Laterality Date  . COLONOSCOPY  08/12/2005  . [place] SURGERY  2004  . [place] SURGERY  05/2018   for comression fx    There were no vitals filed for this visit.  Subjective Assessment - 11/21/19 1240    Subjective  LE were sore after last visit - lasted only about a day.  Had appointment with internal medicine yesterday, went up on Lipitor.  Asking if the few missed visits will be added back to his schedule.    Pertinent History  HLD, sleep apnea, squamous cell skin CA,  lumbar disc surgery (2004, 2019)    Limitations  Walking;Standing    How long can you walk comfortably?  ~1 block    Patient Stated Goals  "get back to normal", walk without pain, throw a football out in the yard    Currently in Pain?  No/denies         [facility] PT Assessment - 11/21/19 1243      6 Minute Walk- Baseline   6 Minute Walk- Baseline  yes    BP (mmHg)  116/76    HR (bpm)  110    02 Sat (%RA)  99 %    Modified Borg Scale for Dyspnea  0- Nothing at all    Perceived Rate of Exertion (Borg)  6-      6 Minute walk- Post Test   6 Minute Walk Post Test  yes    BP (mmHg)  124/89    HR (bpm)  134    02 Sat (%RA)  100 %    Modified Borg Scale for Dyspnea  2- Mild shortness of breath    Perceived Rate of Exertion (Borg)  11- Fairly light      6 minute walk test results    Aerobic Endurance Distance Walked  1364  Endurance additional comments  no pain; peformed outside      Standardized Balance Assessment   Standardized Balance Assessment  Five Times Sit to Stand    Five times sit to stand comments   14.59 seconds from mat, no UE -no pain                   [facility] Adult PT Treatment/Exercise - 11/21/19 1310      Transfers   Transfers  Floor to Transfer    Floor to Transfer  6: Modified independent (Device/Increase time)    Floor to Transfer Details (indicate cue type and reason)  from tall > half kneeling to stand through R then LLE - required one UE support on mat for balance and strength to push all the way to stand due to LLE weakness.  Educated pt on safest way to stand from floor if he has something to hold to to use it for balance; if he doesn't have UE support advised to stand from floor with wide stance for improved BOS and to decrease risk for falling back down to floor             PT Education - 11/21/19 1408    Education Details  discussed plan to D/C next visit due to progress and that missed visits had not affected patient's progress so it  would not be necessary to add more; discussed progress towards goals and recommendations for safe transfer from floor    Person(s) Educated  Patient    Methods  Explanation    Comprehension  Verbalized understanding       PT Short Term Goals - 10/26/19 1137      PT SHORT TERM GOAL #1   Title  Patient will be independent with initial LE strengthening/balance HEP. (all STGs target date 10/28/2019)    Time  4    Period  Weeks    Status  Achieved    Target Date  10/28/19      PT SHORT TERM GOAL #2   Title  Patient will decrease 5xSTS by at least 3 seconds in order to demosntrate clinically significant improvement in LE strength.    Baseline  17 seconds    Time  4    Period  Weeks    Status  Achieved    Target Date  10/28/19      PT SHORT TERM GOAL #3   Title  Patient reports decrease in worst pain (8/10) by 2 points on the NPRS for improved pain and activity tolerance.    Baseline  Pain at worst since starting therapy: 3-4/10    Time  4    Period  Weeks    Status  Achieved    Target Date  10/28/19      PT SHORT TERM GOAL #4   Title  Patient will demosntrate midline resting/sitting posture to indicate dec avoidance of L wt shift.    Time  4    Period  Weeks    Status  Achieved    Target Date  10/28/19      PT SHORT TERM GOAL #5   Title  Patient tolerates ambulation for 5 minutes over various terrain (indoor/outdoor)  without increase in pain for improved activity tolerance.    Time  4    Period  Weeks    Status  Achieved    Target Date  10/28/19        PT Long Term Goals - 11/21/19 1242  PT LONG TERM GOAL #1   Title  Patient is independent with advanced HEP. (all LTGs target date: 11/27/2019)    Time  8    Period  Weeks    Status  On-going      PT LONG TERM GOAL #2   Title  Patient will decrease 5xSTS <15 seconds to demonstrate improvement in functional strength.    Baseline  14.5 seconds    Time  8    Period  Weeks    Status  Achieved      PT LONG TERM  GOAL #3   Title  Pt will perform 6 minute walk test of endurance outside and increase distance to >1200' with </=2/10 pain in LLE    Baseline  1364' outside, slight ache in LLE down side of the leg - eased off quickly    Time  8    Period  Weeks    Status  Achieved      PT LONG TERM GOAL #4   Title  Pt will demonstrate ability to rise from the floor without pushing through hands (pt likes to sit on the floor to watch TV).    Baseline  requires UE to push to stand for balance and due to LLE weakness but can perform MOD I    Time  8    Period  Weeks    Status  Partially Met            Plan - 11/21/19 1410    Clinical Impression Statement  Treatment session focused on assessment of progress towards LTG.  Pt has met 5 time sit to stand and 6 minute walk goal; pt partially met floor > stand transfers - pt able to perform MOD I but requires UE support for balance when standing through half kneeling.  Pt reports significant improvement in pain and activity tolerance.  Will finalize HEP next session and plan to D/C.    Personal Factors and Comorbidities  Comorbidity 3+;Past/Current Experience;Fitness;Time since onset of injury/illness/exacerbation    Comorbidities  HLD, sleep apnea, squamous cell skin CA, lumbar disc surgery (2004, 2019) due to compression fracture    Examination-Activity Limitations  Stairs;Stand;Locomotion Level    Examination-Participation Restrictions  Community Activity    Stability/Clinical Decision Making  Evolving/Moderate complexity    Rehab Potential  Good    PT Frequency  2x / week    PT Duration  8 weeks    PT Treatment/Interventions  ADLs/Self Care Home Management;Gait training;Neuromuscular re-education;Stair training;Functional mobility training;Therapeutic activities;Therapeutic exercise;Balance training;Patient/family education;Aquatic Therapy;Cryotherapy;Moist Heat;Electrical Stimulation;DME Instruction;Orthotic Fit/Training;Manual techniques;Passive range of  motion;Dry needling;Taping    PT Next Visit Plan  finalize HEP - runner's stretch, static lunges, balance, walking program    Consulted and Agree with Plan of Care  Patient       Patient will benefit from skilled therapeutic intervention in order to improve the following deficits and impairments:  Abnormal gait, Decreased strength, Impaired sensation, Decreased balance, Difficulty walking, Decreased range of motion, Decreased activity tolerance, Postural dysfunction, Pain, Impaired flexibility  Visit Diagnosis: Chronic left-sided low back pain, unspecified whether sciatica present  Muscle weakness (generalized)  Abnormal posture  Other abnormalities of gait and mobility  Repeated falls     Problem List Patient Active Problem List   Diagnosis Date Noted  . Coronary artery calcification seen on CT scan 11/20/2019  . Osteoporosis with pathological fracture 04/27/2018  . Compression fracture of L3 lumbar vertebra, closed, initial encounter ([place]) 04/05/2018  . Low back pain  03/25/2018  . Hyperglycemia 04/26/2017  . Depression 04/12/2014  . Smoker 04/12/2014  . Right sided sciatica 03/15/2013  . Habitual alcohol use 03/15/2013  . Traveling 03/15/2013  . Preventative health care 03/10/2011  . OBSTRUCTIVE SLEEP APNEA 06/04/2010  . Hyperlipidemia 10/24/2007  . Allergic rhinitis 10/24/2007    Rico Junker, PT, DPT 11/21/19    2:13 PM    [facility] [address] [place], [place], [address] Phone: [phone]   Fax:  [phone]  Name: Manuel Cruz MRN: [id number] Date of Birth: Jun 02, 1946

## 2019-11-23 ENCOUNTER — Ambulatory Visit: Payer: Medicare Other | Admitting: Physical Therapy

## 2019-11-23 ENCOUNTER — Other Ambulatory Visit: Payer: Self-pay

## 2019-11-23 ENCOUNTER — Encounter: Payer: Self-pay | Admitting: Physical Therapy

## 2019-11-23 DIAGNOSIS — G8929 Other chronic pain: Secondary | ICD-10-CM

## 2019-11-23 DIAGNOSIS — R293 Abnormal posture: Secondary | ICD-10-CM

## 2019-11-23 DIAGNOSIS — M545 Low back pain: Secondary | ICD-10-CM | POA: Diagnosis not present

## 2019-11-23 DIAGNOSIS — R296 Repeated falls: Secondary | ICD-10-CM

## 2019-11-23 DIAGNOSIS — R2689 Other abnormalities of gait and mobility: Secondary | ICD-10-CM

## 2019-11-23 DIAGNOSIS — M6281 Muscle weakness (generalized): Secondary | ICD-10-CM | POA: Diagnosis not present

## 2019-11-23 NOTE — Therapy (Signed)
[facility] [address] [place], [place], [address] Phone: [phone]   Fax:  [phone]  Physical Therapy Treatment and Discharge Summary  Patient Details  Name: Manuel Cruz MRN: [id number] Date of Birth: [birth year] Referring Provider (PT): Penni Bombard, MD   Encounter Date: 11/23/2019  PT End of Session - 11/23/19 1241    Visit Number  14    Number of Visits  17    Date for PT Re-Evaluation  11/27/19    Authorization Type  Medicare Primary/BCBS Seconday    PT Start Time  1154    PT Stop Time  1232    PT Time Calculation (min)  38 min    Activity Tolerance  Patient tolerated treatment well    Behavior During Therapy  [facility] for tasks assessed/performed       Past Medical History:  Diagnosis Date  . Allergic rhinitis   . Allergy   . GERD (gastroesophageal reflux disease)   . Hyperlipidemia   . Sciatica of right side    recurrent  . Sleep apnea    pt denies  . 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 Squamous cell carcinoma in situ (SCCIS) 02/01/2012   Left Sideburn (tx p bx)  . Squamous cell carcinoma in situ (SCCIS) 04/05/2016   Left Forearm(Cx3,5FU), Left Sideburn Sup(Cx3,5FU), and Left Sideburn Inf (Cx3,5FU)  . Squamous cell carcinoma in situ (SCCIS) 05/06/2016   Left Side Neck (tx p bx)  . Squamous cell skin cancer, earlobe   . Warts, genital     Past Surgical History:  Procedure Laterality Date  . COLONOSCOPY  08/12/2005  . [place] SURGERY  2004  . [place] SURGERY  05/2018   for comression fx    There were no vitals filed for this visit.  Subjective Assessment - 11/23/19 1156    Subjective  No questions or thoughts after last session; has not been outside walking this week -"for not particular reason".    Pertinent History  HLD, sleep apnea, squamous cell skin CA, lumbar disc surgery (2004, 2019)    Limitations   Walking;Standing    How long can you walk comfortably?  ~1 block    Patient Stated Goals  "get back to normal", walk without pain, throw a football out in the yard    Currently in Pain?  No/denies       Reviewed and had pt return demonstrate the following exercises:  Walking: Walking at the park for 6-8 minutes at a comfortable pace.  Pain should not go above 4-5 out of 10.    Access Code: [email or web address] URL: [email or web address] Date: 11/23/2019 Prepared by: Misty Stanley  Exercises Seated Hamstring Stretch with Strap - 2 sets - 30 second hold - 1x daily - 7x weekly Hooklying Single Knee to Chest - 4 reps - 10 second hold - 1x daily - 7x weekly Supine Double Knee to Chest - 2 reps - 10 second hold - 1x daily - 7x weekly Supine Figure 4 Piriformis Stretch - 2 sets - 30 second hold - 1x daily - 7x weekly Supine Lower Trunk Rotation - 10 reps - 1x daily - 7x weekly Squat with Chair Touch - 10 reps - 1 sets - 1x daily - 7x weekly Staggered stance with Eyes Open - 10 reps - 4 sets - 1x daily - 7x weekly     PT Education - 11/23/19 1240  Education Details  reviewed and finalized HEP recommendations    Person(s) Educated  Patient    Methods  Explanation;Demonstration;Handout    Comprehension  Verbalized understanding;Returned demonstration       PT Short Term Goals - 10/26/19 1137      PT SHORT TERM GOAL #1   Title  Patient will be independent with initial LE strengthening/balance HEP. (all STGs target date 10/28/2019)    Time  4    Period  Weeks    Status  Achieved    Target Date  10/28/19      PT SHORT TERM GOAL #2   Title  Patient will decrease 5xSTS by at least 3 seconds in order to demosntrate clinically significant improvement in LE strength.    Baseline  17 seconds    Time  4    Period  Weeks    Status  Achieved    Target Date  10/28/19      PT SHORT TERM GOAL #3   Title  Patient reports decrease in worst pain (8/10) by 2 points on the NPRS for  improved pain and activity tolerance.    Baseline  Pain at worst since starting therapy: 3-4/10    Time  4    Period  Weeks    Status  Achieved    Target Date  10/28/19      PT SHORT TERM GOAL #4   Title  Patient will demosntrate midline resting/sitting posture to indicate dec avoidance of L wt shift.    Time  4    Period  Weeks    Status  Achieved    Target Date  10/28/19      PT SHORT TERM GOAL #5   Title  Patient tolerates ambulation for 5 minutes over various terrain (indoor/outdoor)  without increase in pain for improved activity tolerance.    Time  4    Period  Weeks    Status  Achieved    Target Date  10/28/19        PT Long Term Goals - 11/23/19 1242      PT LONG TERM GOAL #1   Title  Patient is independent with advanced HEP. (all LTGs target date: 11/27/2019)    Time  8    Period  Weeks    Status  Achieved      PT LONG TERM GOAL #2   Title  Patient will decrease 5xSTS <15 seconds to demonstrate improvement in functional strength.    Baseline  14.5 seconds    Time  8    Period  Weeks    Status  Achieved      PT LONG TERM GOAL #3   Title  Pt will perform 6 minute walk test of endurance outside and increase distance to >1200' with </=2/10 pain in LLE    Baseline  1364' outside, slight ache in LLE down side of the leg - eased off quickly    Time  8    Period  Weeks    Status  Achieved      PT LONG TERM GOAL #4   Title  Pt will demonstrate ability to rise from the floor without pushing through hands (pt likes to sit on the floor to watch TV).    Baseline  requires UE to push to stand for balance and due to LLE weakness but can perform MOD I    Time  8    Period  Weeks    Status  Partially  Met            Plan - 11/23/19 1242    Clinical Impression Statement  Treatment session focused on final LTG of independence with HEP.  Reviewed, condensed and revised final HEP recommendations and continued recommendations for walking program.  Pt return demonstrated  all exercises.  Pt has made excellent progress and has met 3/4 LTG, partially meeting final goal. Pt demonstrates significant improvement and resolution of back and LE pain, improved LE strength, improved posture and increase in functional LE ROM.  Discussed indications for returning to therapy; pt ready and safe for D/C today.    Personal Factors and Comorbidities  Comorbidity 3+;Past/Current Experience;Fitness;Time since onset of injury/illness/exacerbation    Comorbidities  HLD, sleep apnea, squamous cell skin CA, lumbar disc surgery (2004, 2019) due to compression fracture    Examination-Activity Limitations  Stairs;Stand;Locomotion Level    Examination-Participation Restrictions  Community Activity    Stability/Clinical Decision Making  Evolving/Moderate complexity    Rehab Potential  Good    PT Frequency  2x / week    PT Duration  8 weeks    PT Treatment/Interventions  ADLs/Self Care Home Management;Gait training;Neuromuscular re-education;Stair training;Functional mobility training;Therapeutic activities;Therapeutic exercise;Balance training;Patient/family education;Aquatic Therapy;Cryotherapy;Moist Heat;Electrical Stimulation;DME Instruction;Orthotic Fit/Training;Manual techniques;Passive range of motion;Dry needling;Taping    Consulted and Agree with Plan of Care  Patient       Patient will benefit from skilled therapeutic intervention in order to improve the following deficits and impairments:  Abnormal gait, Decreased strength, Impaired sensation, Decreased balance, Difficulty walking, Decreased range of motion, Decreased activity tolerance, Postural dysfunction, Pain, Impaired flexibility  Visit Diagnosis: Chronic left-sided low back pain, unspecified whether sciatica present  Muscle weakness (generalized)  Abnormal posture  Other abnormalities of gait and mobility  Repeated falls     Problem List Patient Active Problem List   Diagnosis Date Noted  . Coronary artery  calcification seen on CT scan 11/20/2019  . Osteoporosis with pathological fracture 04/27/2018  . Compression fracture of L3 lumbar vertebra, closed, initial encounter ([place]) 04/05/2018  . Low back pain 03/25/2018  . Hyperglycemia 04/26/2017  . Depression 04/12/2014  . Smoker 04/12/2014  . Right sided sciatica 03/15/2013  . Habitual alcohol use 03/15/2013  . Traveling 03/15/2013  . Preventative health care 03/10/2011  . OBSTRUCTIVE SLEEP APNEA 06/04/2010  . Hyperlipidemia 10/24/2007  . Allergic rhinitis 10/24/2007    PHYSICAL THERAPY DISCHARGE SUMMARY  Visits from Start of Care: 14   Current functional level related to goals / functional outcomes: See LTG achievement and impression statement above   Remaining deficits: Impaired LLE strength, impaired balance   Education / Equipment: HEP  Plan: Patient agrees to discharge.  Patient goals were met. Patient is being discharged due to meeting the stated rehab goals.  ?????     Rico Junker, PT, DPT 11/23/19    12:48 PM    [facility] [address] [place], [place], [address] Phone: [phone]   Fax:  [phone]  Name: STYLIANOS STRADLING MRN: [id number] Date of Birth: 1946-09-01

## 2019-11-23 NOTE — Patient Instructions (Signed)
Walking: Walking at the park for 6-8 minutes at a comfortable pace.  Pain should not go above 4-5 out of 10.    Access Code: [email or web address] URL: [email or web address] Date: 11/23/2019 Prepared by: Misty Stanley  Exercises Seated Hamstring Stretch with Strap - 2 sets - 30 second hold - 1x daily - 7x weekly Hooklying Single Knee to Chest - 4 reps - 10 second hold - 1x daily - 7x weekly Supine Double Knee to Chest - 2 reps - 10 second hold - 1x daily - 7x weekly Supine Figure 4 Piriformis Stretch - 2 sets - 30 second hold - 1x daily - 7x weekly Supine Lower Trunk Rotation - 10 reps - 1x daily - 7x weekly Squat with Chair Touch - 10 reps - 1 sets - 1x daily - 7x weekly Staggered stance with Eyes Open - 10 reps - 4 sets - 1x daily - 7x weekly

## 2019-12-10 ENCOUNTER — Other Ambulatory Visit: Payer: Self-pay

## 2019-12-10 MED ORDER — OMEPRAZOLE 20 MG PO CPDR: 20.0000 mg | DELAYED_RELEASE_CAPSULE | Freq: Every day | ORAL | 3 refills | Status: DC

## 2019-12-31 ENCOUNTER — Other Ambulatory Visit: Payer: Self-pay | Admitting: Internal Medicine

## 2020-01-25 ENCOUNTER — Other Ambulatory Visit: Payer: Self-pay | Admitting: Internal Medicine

## 2020-01-25 MED ORDER — CALCITONIN (SALMON) 200 UNIT/ACT NA SOLN: 1.0000 | Freq: Every day | NASAL | 1 refills | Status: DC

## 2020-01-25 NOTE — Telephone Encounter (Signed)
New message:   1.Medication Requested: calcitonin, salmon, (MIACALCIN/FORTICAL) 200 UNIT/ACT nasal spray 2. Pharmacy (Name, [facility]): [id number] - [place], [place]. AT [address] [place] 3. On Med List: Yes  4. Last Visit with PCP: 11/20/19  5. Next visit date with PCP: 05/22/20   Agent: Please be advised that RX refills may take up to 3 business days. We ask that you follow-up with your pharmacy.

## 2020-02-17 ENCOUNTER — Other Ambulatory Visit: Payer: Self-pay | Admitting: Internal Medicine

## 2020-02-19 DIAGNOSIS — H04123 Dry eye syndrome of bilateral lacrimal glands: Secondary | ICD-10-CM | POA: Diagnosis not present

## 2020-02-19 DIAGNOSIS — H2513 Age-related nuclear cataract, bilateral: Secondary | ICD-10-CM | POA: Diagnosis not present

## 2020-02-19 DIAGNOSIS — H524 Presbyopia: Secondary | ICD-10-CM | POA: Diagnosis not present

## 2020-02-19 DIAGNOSIS — H10413 Chronic giant papillary conjunctivitis, bilateral: Secondary | ICD-10-CM | POA: Diagnosis not present

## 2020-02-19 DIAGNOSIS — H35363 Drusen (degenerative) of macula, bilateral: Secondary | ICD-10-CM | POA: Diagnosis not present

## 2020-02-19 DIAGNOSIS — H52223 Regular astigmatism, bilateral: Secondary | ICD-10-CM | POA: Diagnosis not present

## 2020-03-10 ENCOUNTER — Other Ambulatory Visit: Payer: Self-pay

## 2020-03-10 MED ORDER — CALCITONIN (SALMON) 200 UNIT/ACT NA SOLN: 1.0000 | Freq: Every day | NASAL | 1 refills | Status: DC

## 2020-03-10 NOTE — Telephone Encounter (Signed)
erx sent as requested.  

## 2020-03-10 NOTE — Telephone Encounter (Signed)
1.Medication Requested:calcitonin, salmon, (MIACALCIN/FORTICAL) 200 UNIT/ACT nasal spray -  90 day supply    2. Pharmacy (Name[place] [facility] [id number] [place], [place] - [place]. AT [address] [place]  3. On Med List: Yes   4. Last Visit with PCP: 39.21  5. Next visit date with PCP: 9.9.21   Agent: Please be advised that RX refills may take up to 3 business days. We ask that you follow-up with your pharmacy.

## 2020-03-11 ENCOUNTER — Other Ambulatory Visit: Payer: Self-pay | Admitting: Internal Medicine

## 2020-03-30 ENCOUNTER — Other Ambulatory Visit: Payer: Self-pay | Admitting: Internal Medicine

## 2020-04-16 ENCOUNTER — Encounter: Payer: Self-pay | Admitting: Dermatology

## 2020-04-16 ENCOUNTER — Ambulatory Visit (INDEPENDENT_AMBULATORY_CARE_PROVIDER_SITE_OTHER): Payer: Medicare Other | Admitting: Dermatology

## 2020-04-16 ENCOUNTER — Other Ambulatory Visit: Payer: Self-pay

## 2020-04-16 DIAGNOSIS — I2510 Atherosclerotic heart disease of native coronary artery without angina pectoris: Secondary | ICD-10-CM

## 2020-04-16 DIAGNOSIS — L720 Epidermal cyst: Secondary | ICD-10-CM

## 2020-04-16 DIAGNOSIS — L570 Actinic keratosis: Secondary | ICD-10-CM | POA: Diagnosis not present

## 2020-04-16 DIAGNOSIS — A630 Anogenital (venereal) warts: Secondary | ICD-10-CM | POA: Diagnosis not present

## 2020-04-16 DIAGNOSIS — D229 Melanocytic nevi, unspecified: Secondary | ICD-10-CM

## 2020-04-16 DIAGNOSIS — D225 Melanocytic nevi of trunk: Secondary | ICD-10-CM | POA: Diagnosis not present

## 2020-04-16 DIAGNOSIS — Z1283 Encounter for screening for malignant neoplasm of skin: Secondary | ICD-10-CM | POA: Diagnosis not present

## 2020-04-16 NOTE — Patient Instructions (Addendum)
Routine follow-up visit for Manuel Cruz date of birth 1946-01-18.  Several skin issues discussed.  He has a new lump on the back of his right shoulder which represents an open epidermoid cyst.  Small closed epidermoid cysts are on the left neck laterally and posteriorly.  These are invariably benign and if Manuel Cruz should want them surgically removed he will schedule 30 minutes each.  There are no atypical moles or skin cancer from the waist up.  He has diffuse sun damage on the face particularly right outer eyebrow, forehead, and left cheek.  He is good candidate in the winter to either do 1 to 2 months of [place] cream or bluelight PDT therapy.  A PDT brochure provided and if he wants to schedule this it will be in the early morning sometime between late October and February.  Finally he has some new bumps on the penis and upper scrotum.  These are verrucous 4 to 5 mm pink papules which are new genital warts.  Multiple treatments discussed and we elected to treat the spots with 6-second liquid nitrogen freeze; these will swell and may peel in the next 2 weeks.  Other than common sense like no marathon bicycle ride, no special care.  There is also a patch of slightly inflamed pink spots on the head of the penis and the left shaft.  He was unaware of these and there is no history of HSV but if these persist I asked him to get back to me.  Routine follow-up will be scheduled in the winter.

## 2020-04-17 ENCOUNTER — Ambulatory Visit: Payer: Medicare Other | Admitting: Physician Assistant

## 2020-04-22 DIAGNOSIS — H2513 Age-related nuclear cataract, bilateral: Secondary | ICD-10-CM | POA: Diagnosis not present

## 2020-04-22 DIAGNOSIS — H04123 Dry eye syndrome of bilateral lacrimal glands: Secondary | ICD-10-CM | POA: Diagnosis not present

## 2020-04-22 DIAGNOSIS — H10413 Chronic giant papillary conjunctivitis, bilateral: Secondary | ICD-10-CM | POA: Diagnosis not present

## 2020-04-22 DIAGNOSIS — H43821 Vitreomacular adhesion, right eye: Secondary | ICD-10-CM | POA: Diagnosis not present

## 2020-04-22 DIAGNOSIS — H35363 Drusen (degenerative) of macula, bilateral: Secondary | ICD-10-CM | POA: Diagnosis not present

## 2020-04-22 LAB — HM DIABETES EYE EXAM

## 2020-04-28 ENCOUNTER — Encounter: Payer: Self-pay | Admitting: Internal Medicine

## 2020-04-29 ENCOUNTER — Encounter (INDEPENDENT_AMBULATORY_CARE_PROVIDER_SITE_OTHER): Payer: Medicare Other | Admitting: Ophthalmology

## 2020-04-29 ENCOUNTER — Other Ambulatory Visit: Payer: Self-pay

## 2020-04-29 ENCOUNTER — Telehealth: Payer: Self-pay

## 2020-04-29 DIAGNOSIS — H35071 Retinal telangiectasis, right eye: Secondary | ICD-10-CM | POA: Diagnosis not present

## 2020-04-29 DIAGNOSIS — H43813 Vitreous degeneration, bilateral: Secondary | ICD-10-CM | POA: Diagnosis not present

## 2020-04-29 DIAGNOSIS — H353121 Nonexudative age-related macular degeneration, left eye, early dry stage: Secondary | ICD-10-CM | POA: Diagnosis not present

## 2020-04-29 DIAGNOSIS — M810 Age-related osteoporosis without current pathological fracture: Secondary | ICD-10-CM

## 2020-04-29 NOTE — Telephone Encounter (Signed)
Forwarding msg to front staff to schedule bone density.Marland KitchenJohny Chess

## 2020-04-29 NOTE — Telephone Encounter (Signed)
New message    Need an order for bone density test - reason repeat from 2019.

## 2020-04-29 NOTE — Telephone Encounter (Signed)
   Scheduler left message vcml to call for bone density appt

## 2020-04-29 NOTE — Telephone Encounter (Signed)
Staff to assist pt with scheduling of dxa at [place] please

## 2020-05-02 ENCOUNTER — Ambulatory Visit (INDEPENDENT_AMBULATORY_CARE_PROVIDER_SITE_OTHER)
Admission: RE | Admit: 2020-05-02 | Discharge: 2020-05-02 | Disposition: A | Discharge status: Home / Self Care | Payer: Medicare Other | Source: Ambulatory Visit | Attending: Internal Medicine | Admitting: Internal Medicine

## 2020-05-02 ENCOUNTER — Encounter: Payer: Self-pay | Admitting: Internal Medicine

## 2020-05-02 ENCOUNTER — Other Ambulatory Visit: Payer: Self-pay

## 2020-05-02 DIAGNOSIS — M810 Age-related osteoporosis without current pathological fracture: Secondary | ICD-10-CM

## 2020-05-22 ENCOUNTER — Other Ambulatory Visit: Payer: Self-pay

## 2020-05-22 ENCOUNTER — Encounter: Payer: Self-pay | Admitting: Internal Medicine

## 2020-05-22 ENCOUNTER — Ambulatory Visit (INDEPENDENT_AMBULATORY_CARE_PROVIDER_SITE_OTHER): Payer: Medicare Other | Admitting: Internal Medicine

## 2020-05-22 VITALS — BP 130/80 | HR 78 | Temp 97.9°F | Ht 68.0 in | Wt 149.0 lb

## 2020-05-22 DIAGNOSIS — R739 Hyperglycemia, unspecified: Secondary | ICD-10-CM | POA: Diagnosis not present

## 2020-05-22 DIAGNOSIS — F32A Depression, unspecified: Secondary | ICD-10-CM

## 2020-05-22 DIAGNOSIS — F329 Major depressive disorder, single episode, unspecified: Secondary | ICD-10-CM | POA: Diagnosis not present

## 2020-05-22 DIAGNOSIS — R7989 Other specified abnormal findings of blood chemistry: Secondary | ICD-10-CM | POA: Insufficient documentation

## 2020-05-22 DIAGNOSIS — I2510 Atherosclerotic heart disease of native coronary artery without angina pectoris: Secondary | ICD-10-CM | POA: Diagnosis not present

## 2020-05-22 DIAGNOSIS — E785 Hyperlipidemia, unspecified: Secondary | ICD-10-CM

## 2020-05-22 LAB — COMPLETE METABOLIC PANEL WITH GFR
AG Ratio: 1.4 (calc) (ref 1.0–2.5)
ALT: 69 U/L — ABNORMAL HIGH (ref 9–46)
AST: 68 U/L — ABNORMAL HIGH (ref 10–35)
Albumin: 4.1 g/dL (ref 3.6–5.1)
Alkaline phosphatase (APISO): 73 U/L (ref 35–144)
BUN: 13 mg/dL (ref 7–25)
CO2: 24 mmol/L (ref 20–32)
Calcium: 9.2 mg/dL (ref 8.6–10.3)
Chloride: 105 mmol/L (ref 98–110)
Creat: 0.75 mg/dL (ref 0.70–1.18)
GFR, Est African American: 106 mL/min/{1.73_m2} (ref >=60)
GFR, Est Non African American: 92 mL/min/{1.73_m2} (ref >=60)
Globulin: 2.9 g/dL (calc) (ref 1.9–3.7)
Glucose, Bld: 74 mg/dL (ref 65–99)
Potassium: 4.4 mmol/L (ref 3.5–5.3)
Sodium: 140 mmol/L (ref 135–146)
Total Bilirubin: 0.6 mg/dL (ref 0.2–1.2)
Total Protein: 7 g/dL (ref 6.1–8.1)

## 2020-05-22 NOTE — Patient Instructions (Addendum)

## 2020-05-22 NOTE — Progress Notes (Signed)
Subjective:    Patient ID: Manuel Cruz, male    DOB: [birth year], 74 y.o.   MRN: [id number]  HPI  .Here to f/u; overall doing ok,  Pt denies chest pain, increasing sob or doe, wheezing, orthopnea, PND, increased LE swelling, palpitations, dizziness or syncope.  Pt denies new neurological symptoms such as new headache, or facial or extremity weakness or numbness.  Pt denies polydipsia, polyuria, or low sugar episode.  Pt states overall good compliance with meds, mostly trying to follow appropriate diet, with wt overall stable,  but little exercise however. Denies worsening depressive symptoms, suicidal ideation, or panic;  Has wt loss due to better diet Wt Readings from Last 3 Encounters:  05/22/20 149 lb (67.6 kg)  11/20/19 156 lb (70.8 kg)  09/24/19 154 lb 3.2 oz (69.9 kg)   Past Medical History:  Diagnosis Date  . Allergic rhinitis   . Allergy   . GERD (gastroesophageal reflux disease)   . Hyperlipidemia   . Sciatica of right side    recurrent  . Sleep apnea    pt denies  . 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 Squamous cell carcinoma in situ (SCCIS) 02/01/2012   Left Sideburn (tx p bx)  . Squamous cell carcinoma in situ (SCCIS) 04/05/2016   Left Forearm(Cx3,5FU), Left Sideburn Sup(Cx3,5FU), and Left Sideburn Inf (Cx3,5FU)  . Squamous cell carcinoma in situ (SCCIS) 05/06/2016   Left Side Neck (tx p bx)  . Squamous cell skin cancer, earlobe   . Warts, genital    Past Surgical History:  Procedure Laterality Date  . COLONOSCOPY  08/12/2005  . [place] SURGERY  2004  . [place] SURGERY  05/2018   for comression fx    reports that he has been smoking cigarettes. He has been smoking about 0.25 packs per day. He has never used smokeless tobacco. He reports current alcohol use of about 10.0 standard drinks of alcohol per week. He reports that he does not use drugs. family history includes Cancer in his mother;  Hyperlipidemia in his brother and father. No Known Allergies Current Outpatient Medications on File Prior to Visit  Medication Sig Dispense Refill  . aspirin 81 MG EC tablet Take 1 tablet (81 mg total) by mouth daily. 90 tablet 1  . atorvastatin (LIPITOR) 80 MG tablet TAKE 1 TABLET DAILY 90 tablet 3  . b complex vitamins tablet Take 1 tablet by mouth daily.    . calcitonin, salmon, (MIACALCIN/FORTICAL) 200 UNIT/ACT nasal spray INSTILL 1 SPRAY INTO ALTERNATE NOSTRILS DAILY 3.7 mL 1  . Carboxymeth-Glycerin-Polysorb (REFRESH OPTIVE ADVANCED OP) Apply 1 drop to eye 2 (two) times daily.    . Cetirizine HCl (ZYRTEC ALLERGY) 10 MG CAPS Take 1 capsule (10 mg total) by mouth as needed. 90 capsule 3  . gabapentin (NEURONTIN) 300 MG capsule TAKE 2 CAPSULES 3 TIMES A  DAY 540 capsule 1  . Glucosamine-Chondroitin (MOVE FREE PO) Take by mouth daily.    Marland Kitchen ketotifen (ZADITOR) 0.025 % ophthalmic solution Place 1 drop into both eyes 2 (two) times daily.    . Melatonin 3 MG TABS Take by mouth.    . Multiple Vitamin (MULTIVITAMIN) tablet Take 1 tablet by mouth daily.    . naproxen (NAPROSYN) 250 MG tablet Take by mouth 2 (two) times daily with a meal.    . omeprazole (PRILOSEC) 20 MG capsule Take 1 capsule (20 mg total) by mouth daily. 90 capsule  3  . Probiotic Product (PROBIOTIC PO) Take by mouth.     No current facility-administered medications on file prior to visit.   Review of Systems All otherwise neg per pt    Objective:   Physical Exam BP 130/80 (BP Location: Left Arm, Patient Position: Sitting, Cuff Size: Large)   Pulse 78   Temp 97.9 F (36.6 C) (Oral)   Ht 5\' 8"  (1.727 m)   Wt 149 lb (67.6 kg)   SpO2 97%   BMI 22.66 kg/m  VS noted,  Constitutional: Pt appears in NAD HENT: Head: NCAT.  Right Ear: External ear normal.  Left Ear: External ear normal.  Eyes: . Pupils are equal, round, and reactive to light. Conjunctivae and EOM are normal Nose: without d/c or deformity Neck: Neck supple.  Gross normal ROM Cardiovascular: Normal rate and regular rhythm.   Pulmonary/Chest: Effort normal and breath sounds without rales or wheezing.  Abd:  Soft, NT, ND, + BS, no organomegaly Neurological: Pt is alert. At baseline orientation, motor grossly intact Skin: Skin is warm. No rashes, other new lesions, no LE edema Psychiatric: Pt behavior is normal without agitation  All otherwise neg per pt Lab Results  Component Value Date   WBC 6.2 11/20/2019   HGB 14.7 11/20/2019   HCT 43.0 11/20/2019   PLT 233.0 11/20/2019   GLUCOSE 74 05/22/2020   CHOL 165 05/22/2020   TRIG 226 (H) 05/22/2020   HDL 78 05/22/2020   LDLDIRECT 54.0 11/20/2019   LDLCALC 59 05/22/2020   ALT 69 (H) 05/22/2020   AST 68 (H) 05/22/2020   NA 140 05/22/2020   K 4.4 05/22/2020   CL 105 05/22/2020   CREATININE 0.75 05/22/2020   BUN 13 05/22/2020   CO2 24 05/22/2020   TSH 1.66 11/20/2019   PSA 1.24 11/20/2019   HGBA1C 5.4 05/22/2020      Assessment & Plan:

## 2020-05-23 ENCOUNTER — Ambulatory Visit (INDEPENDENT_AMBULATORY_CARE_PROVIDER_SITE_OTHER): Payer: Medicare Other

## 2020-05-23 DIAGNOSIS — Z0000 Encounter for general adult medical examination without abnormal findings: Secondary | ICD-10-CM

## 2020-05-23 LAB — HEMOGLOBIN A1C
Hgb A1c MFr Bld: 5.4 % of total Hgb (ref <5.7)
Mean Plasma Glucose: 108 (calc)
eAG (mmol/L): 6 (calc)

## 2020-05-23 LAB — LIPID PANEL
Cholesterol: 165 mg/dL (ref <200)
HDL: 78 mg/dL (ref >=40)
LDL Cholesterol (Calc): 59 mg/dL (calc)
Non-HDL Cholesterol (Calc): 87 mg/dL (calc) (ref <130)
Total CHOL/HDL Ratio: 2.1 (calc) (ref <5.0)
Triglycerides: 226 mg/dL — ABNORMAL HIGH (ref <150)

## 2020-05-23 NOTE — Progress Notes (Signed)
I connected with Manuel Cruz today by telephone and verified that I am speaking with the correct person using two identifiers. Location patient: home Location provider: work Persons participating in the virtual visit: Almir Botts and Lisette Abu, LPN   I discussed the limitations, risks, security and privacy concerns of performing an evaluation and management service by telephone and the availability of in person appointments. I also discussed with the patient that there may be a patient responsible charge related to this service. The patient expressed understanding and verbally consented to this telephonic visit.    Interactive audio and video telecommunications were attempted between this provider and patient, however failed, due to patient having technical difficulties OR patient did not have access to video capability.  We continued and completed visit with audio only.  Some vital signs may be absent or patient reported.   Time Spent with patient on telephone encounter: 20 minutes  Subjective:   Manuel Cruz is a 75 y.o. male who presents for Medicare Annual/Subsequent preventive examination.  Review of Systems    No ROS. Medicare Wellness Visit Cardiac Risk Factors include: advanced age (>47men, >35 women);dyslipidemia;male gender     Objective:    There were no vitals filed for this visit. There is no height or weight on file to calculate BMI.  Advanced Directives 05/23/2020 09/28/2019 05/09/2019 01/09/2018 01/04/2017 09/03/2015 07/23/2015  Does Patient Have a Medical Advance Directive? Yes Yes Yes Yes Yes Yes Yes  Type of Advance Directive - [place];Living will [place];Living will [place];Living will Living will [place]  Does patient want to make changes to medical advance directive? No - Patient declined - - - - - -  Copy of [place] in Chart?  - - No - copy requested No - copy requested No - copy requested No - copy requested -    Current Medications (verified) Outpatient Encounter Medications as of 05/23/2020  Medication Sig  . aspirin 81 MG EC tablet Take 1 tablet (81 mg total) by mouth daily.  Marland Kitchen atorvastatin (LIPITOR) 80 MG tablet TAKE 1 TABLET DAILY  . b complex vitamins tablet Take 1 tablet by mouth daily.  . calcitonin, salmon, (MIACALCIN/FORTICAL) 200 UNIT/ACT nasal spray INSTILL 1 SPRAY INTO ALTERNATE NOSTRILS DAILY  . Carboxymeth-Glycerin-Polysorb (REFRESH OPTIVE ADVANCED OP) Apply 1 drop to eye 2 (two) times daily.  . Cetirizine HCl (ZYRTEC ALLERGY) 10 MG CAPS Take 1 capsule (10 mg total) by mouth as needed.  . gabapentin (NEURONTIN) 300 MG capsule TAKE 2 CAPSULES 3 TIMES A  DAY  . Glucosamine-Chondroitin (MOVE FREE PO) Take by mouth daily.  Marland Kitchen ketotifen (ZADITOR) 0.025 % ophthalmic solution Place 1 drop into both eyes 2 (two) times daily.  . Melatonin 3 MG TABS Take by mouth.  . Multiple Vitamin (MULTIVITAMIN) tablet Take 1 tablet by mouth daily.  . naproxen (NAPROSYN) 250 MG tablet Take by mouth 2 (two) times daily with a meal.  . omeprazole (PRILOSEC) 20 MG capsule Take 1 capsule (20 mg total) by mouth daily.  . Probiotic Product (PROBIOTIC PO) Take by mouth.   No facility-administered encounter medications on file as of 05/23/2020.    Allergies (verified) Patient has no known allergies.   History: Past Medical History:  Diagnosis Date  . Allergic rhinitis   . Allergy   . GERD (gastroesophageal reflux disease)   . Hyperlipidemia   . Sciatica of right side  recurrent  . Sleep apnea    pt denies  . 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 Squamous cell carcinoma in situ (SCCIS) 02/01/2012   Left Sideburn (tx p bx)  . Squamous cell carcinoma in situ (SCCIS) 04/05/2016   Left Forearm(Cx3,5FU), Left Sideburn Sup(Cx3,5FU), and Left Sideburn Inf (Cx3,5FU)  .  Squamous cell carcinoma in situ (SCCIS) 05/06/2016   Left Side Neck (tx p bx)  . Squamous cell skin cancer, earlobe   . Warts, genital    Past Surgical History:  Procedure Laterality Date  . COLONOSCOPY  08/12/2005  . [place] SURGERY  2004  . [place] SURGERY  05/2018   for comression fx   Family History  Problem Relation Age of Onset  . Hyperlipidemia Father   . Hyperlipidemia Brother   . Cancer Mother        lung   Social History   Socioeconomic History  . Marital status: Single    Spouse name: Not on file  . Number of children: 0  . Years of education: Not on file  . Highest education level: Bachelor's degree (e.g., BA, AB, BS)  Occupational History  . Occupation: insurance    Comment: retired  Tobacco Use  . Smoking status: Current Every Day Smoker    Packs/day: 0.25    Types: Cigarettes  . Smokeless tobacco: Never Used  Vaping Use  . Vaping Use: Never used  Substance and Sexual Activity  . Alcohol use: Yes    Alcohol/week: 10.0 standard drinks    Types: 10 Standard drinks or equivalent per week    Comment: wine  . Drug use: No  . Sexual activity: Yes  Other Topics Concern  . Not on file  Social History Narrative   Lives with sig other   Caffeine- sodas 2 daily    Social Determinants of Health   Financial Resource Strain: Low Risk   . Difficulty of Paying Living Expenses: Not hard at all  Food Insecurity: No Food Insecurity  . Worried About Charity fundraiser in the Last Year: Never true  . Ran Out of Food in the Last Year: Never true  Transportation Needs: No Transportation Needs  . Lack of Transportation (Medical): No  . Lack of Transportation (Non-Medical): No  Physical Activity: Insufficiently Active  . Days of Exercise per Week: 3 days  . Minutes of Exercise per Session: 20 min  Stress: No Stress Concern Present  . Feeling of Stress : Not at all  Social Connections:   . Frequency of Communication with Friends and Family: Not on file    . Frequency of Social Gatherings with Friends and Family: Not on file  . Attends Religious Services: Not on file  . Active Member of Clubs or Organizations: Not on file  . Attends Archivist Meetings: Not on file  . Marital Status: Not on file    Tobacco Counseling Ready to quit: Not Answered Counseling given: Not Answered   Clinical Intake:  Pre-visit preparation completed: Yes  Pain : No/denies pain     Nutritional Risks: None Diabetes: No  How often do you need to have someone help you when you read instructions, pamphlets, or other written materials from your doctor or pharmacy?: 1 - Never What is the last grade level you completed in school?: Bachelor's Degree  Diabetic? no  Interpreter Needed?: No  Information entered by :: Traves Majchrzak N. Lowell Guitar, LPN  Activities of Daily Living In your present state of health, do you have any difficulty performing the following activities: 05/23/2020  Hearing? N  Vision? N  Difficulty concentrating or making decisions? N  Walking or climbing stairs? N  Dressing or bathing? N  Doing errands, shopping? N  Preparing Food and eating ? N  Using the Toilet? N  In the past six months, have you accidently leaked urine? N  Do you have problems with loss of bowel control? N  Managing your Medications? N  Managing your Finances? N  Housekeeping or managing your Housekeeping? N  Some recent data might be hidden    Patient Care Team: Biagio Borg, MD as PCP - Cristino Martes, MD as Consulting Physician (Dermatology)  Indicate any recent Medical Services you may have received from other than [facility] providers in the past year (date may be approximate).     Assessment:   This is a routine wellness examination for Manuel Cruz.  Hearing/Vision screen No exam data present  Dietary issues and exercise activities discussed: Current Exercise Habits: Home exercise routine, Type of exercise: walking;strength training/weights,  Time (Minutes): 20, Frequency (Times/Week): 3, Weekly Exercise (Minutes/Week): 60, Intensity: Mild, Exercise limited by: neurologic condition(s);psychological condition(s)  Goals    .  mix of ideas      Volunteering to help others, reading to spiritually enhance life, continue to be active, eat healthy, enjoy life.    .  Patient Stated      I want to tweak a few things, like increase water intake to 2 glasses daily, eat heart healthy to lower triglycerides and blood pressure. I will watch how much salt and saturated fat I eat.    .  Patient Stated (pt-stated)      My goal is to improve my health status by walking more and being more physical.      Depression Screen PHQ 2/9 Scores 05/23/2020 05/22/2020 11/20/2019 05/09/2019 05/09/2019 11/08/2018 05/03/2018  PHQ - 2 Score 0 0 0 0 0 1 1  PHQ- 9 Score - - - - 0 - -    Fall Risk Fall Risk  05/23/2020 05/22/2020 05/22/2020 11/20/2019 05/09/2019  Falls in the past year? 0 0 0 1 1  Number falls in past yr: 0 - 0 0 1  Injury with Fall? 0 - 0 - 0  Risk for fall due to : No Fall Risks - No Fall Risks - -  Follow up Falls evaluation completed - Falls evaluation completed - -    Any stairs in or around the home? Yes  If so, are there any without handrails? No  Home free of loose throw rugs in walkways, pet beds, electrical cords, etc? Yes  Adequate lighting in your home to reduce risk of falls? Yes   ASSISTIVE DEVICES UTILIZED TO PREVENT FALLS:  Life alert? No  Use of a cane, walker or w/c? No  Grab bars in the bathroom? Yes  Shower chair or bench in shower? No  Elevated toilet seat or a handicapped toilet? No   TIMED UP AND GO:  Was the test performed? No .  Length of time to ambulate 10 feet: 0 sec.   Gait steady and fast without use of assistive device  Cognitive Function: Patient is cogitatively intact.        Immunizations Immunization History  Administered Date(s) Administered  . H1N1 08/13/2008  . Influenza Whole 06/13/2008  .  Influenza, High Dose Seasonal PF 06/08/2018, 04/30/2019, 04/30/2019  . Influenza,inj,Quad  PF,6+ Mos 06/11/2014  . Influenza-Unspecified 04/25/2019  . PFIZER SARS-COV-2 Vaccination 10/08/2019, 10/29/2019  . Pneumococcal Conjugate-13 03/15/2013, 04/10/2014  . Pneumococcal Polysaccharide-23 04/17/2015  . Td 10/24/2008  . Tdap 11/08/2018  . Zoster 01/19/2010  . Zoster Recombinat (Shingrix) 06/02/2017, 11/27/2017    TDAP status: Up to date Flu Vaccine status: Up to date Pneumococcal vaccine status: Up to date Covid-19 vaccine status: Completed vaccines  Qualifies for Shingles Vaccine? Yes   Zostavax completed Yes   Shingrix Completed?: Yes  Screening Tests Health Maintenance  Topic Date Due  . INFLUENZA VACCINE  12/11/2020 (Originally 04/13/2020)  . COLONOSCOPY  09/02/2020  . TETANUS/TDAP  11/08/2028  . COVID-19 Vaccine  Completed  . Hepatitis C Screening  Completed  . PNA vac Low Risk Adult  Completed    Health Maintenance  There are no preventive care reminders to display for this patient.  Colorectal cancer screening: Completed 09/03/2015. Repeat every 5 years  Lung Cancer Screening: (Low Dose CT Chest recommended if Age 33-80 years, 30 pack-year currently smoking OR have quit w/in 15years.) does qualify.   Lung Cancer Screening Referral: no  Additional Screening:  Hepatitis C Screening: does qualify; Completed yes  Vision Screening: Recommended annual ophthalmology exams for early detection of glaucoma and other disorders of the eye. Is the patient up to date with their annual eye exam?  Yes  Who is the provider or what is the name of the office in which the patient attends annual eye exams? Virgina Evener, OD with [facility] If pt is not established with a provider, would they like to be referred to a provider to establish care? No .   Dental Screening: Recommended annual dental exams for proper oral hygiene  Community Resource Referral / Chronic Care  Management: CRR required this visit?  No   CCM required this visit?  No      Plan:     I have personally reviewed and noted the following in the patient's chart:   . Medical and social history . Use of alcohol, tobacco or illicit drugs  . Current medications and supplements . Functional ability and status . Nutritional status . Physical activity . Advanced directives . List of other physicians . Hospitalizations, surgeries, and ER visits in previous 12 months . Vitals . Screenings to include cognitive, depression, and falls . Referrals and appointments  In addition, I have reviewed and discussed with patient certain preventive protocols, quality metrics, and best practice recommendations. A written personalized care plan for preventive services as well as general preventive health recommendations were provided to patient.     Sheral Flow, LPN   04/21/9832   Nurse Notes:  Patient is cogitatively intact. There were no vitals filed for this visit. There is no height or weight on file to calculate BMI. Patient stated that he has no issues with gait or balance; does not use any assistive devices.

## 2020-05-23 NOTE — Patient Instructions (Addendum)
Manuel Cruz , Thank you for taking time to come for your Medicare Wellness Visit. I appreciate your ongoing commitment to your health goals. Please review the following plan we discussed and let me know if I can assist you in the future.   Screening recommendations/referrals: Colonoscopy: 09/03/2015; due every 5 years Recommended yearly ophthalmology/optometry visit for glaucoma screening and checkup Recommended yearly dental visit for hygiene and checkup  Vaccinations: Influenza vaccine: 04/30/2019 Pneumococcal vaccine: up to date Tdap vaccine: 11/08/2018 Shingles vaccine: up to date   Covid-19: up to date  Advanced directives: Documents on file.  Conditions/risks identified: Yes; Reviewed health maintenance screenings with patient today and relevant education, vaccines, and/or referrals were provided. Please continue to do your personal lifestyle choices by: daily care of teeth and gums, regular physical activity (goal should be 5 days a week for 30 minutes), eat a healthy diet, avoid tobacco and drug use, limiting any alcohol intake, taking a low-dose aspirin (if not allergic or have been advised by your provider otherwise) and taking vitamins and minerals as recommended by your provider. Continue doing brain stimulating activities (puzzles, reading, adult coloring books, staying active) to keep memory sharp. Continue to eat heart healthy diet (full of fruits, vegetables, whole grains, lean protein, water--limit salt, fat, and sugar intake) and increase physical activity as tolerated.  Next appointment: Please schedule your next Medicare Wellness Visit with your Nurse Health Advisor in 1 year.  Preventive Care 31 Years and Older, Male Preventive care refers to lifestyle choices and visits with your health care provider that can promote health and wellness. What does preventive care include?  A yearly physical exam. This is also called an annual well check.  Dental exams once or twice a  year.  Routine eye exams. Ask your health care provider how often you should have your eyes checked.  Personal lifestyle choices, including:  Daily care of your teeth and gums.  Regular physical activity.  Eating a healthy diet.  Avoiding tobacco and drug use.  Limiting alcohol use.  Practicing safe sex.  Taking low doses of aspirin every day.  Taking vitamin and mineral supplements as recommended by your health care provider. What happens during an annual well check? The services and screenings done by your health care provider during your annual well check will depend on your age, overall health, lifestyle risk factors, and family history of disease. Counseling  Your health care provider may ask you questions about your:  Alcohol use.  Tobacco use.  Drug use.  Emotional well-being.  Home and relationship well-being.  Sexual activity.  Eating habits.  History of falls.  Memory and ability to understand (cognition).  Work and work Statistician. Screening  You may have the following tests or measurements:  Height, weight, and BMI.  Blood pressure.  Lipid and cholesterol levels. These may be checked every 5 years, or more frequently if you are over 40 years old.  Skin check.  Lung cancer screening. You may have this screening every year starting at age 31 if you have a 30-pack-year history of smoking and currently smoke or have quit within the past 15 years.  Fecal occult blood test (FOBT) of the stool. You may have this test every year starting at age 66.  Flexible sigmoidoscopy or colonoscopy. You may have a sigmoidoscopy every 5 years or a colonoscopy every 10 years starting at age 51.  Prostate cancer screening. Recommendations will vary depending on your family history and other risks.  Hepatitis C blood  test.  Hepatitis B blood test.  Sexually transmitted disease (STD) testing.  Diabetes screening. This is done by checking your blood sugar  (glucose) after you have not eaten for a while (fasting). You may have this done every 1-3 years.  Abdominal aortic aneurysm (AAA) screening. You may need this if you are a current or former smoker.  Osteoporosis. You may be screened starting at age 6 if you are at high risk. Talk with your health care provider about your test results, treatment options, and if necessary, the need for more tests. Vaccines  Your health care provider may recommend certain vaccines, such as:  Influenza vaccine. This is recommended every year.  Tetanus, diphtheria, and acellular pertussis (Tdap, Td) vaccine. You may need a Td booster every 10 years.  Zoster vaccine. You may need this after age 62.  Pneumococcal 13-valent conjugate (PCV13) vaccine. One dose is recommended after age 58.  Pneumococcal polysaccharide (PPSV23) vaccine. One dose is recommended after age 97. Talk to your health care provider about which screenings and vaccines you need and how often you need them. This information is not intended to replace advice given to you by your health care provider. Make sure you discuss any questions you have with your health care provider. Document Released: 09/26/2015 Document Revised: 05/19/2016 Document Reviewed: 07/01/2015 Elsevier Interactive Patient Education  2017 [employer] Prevention in the Home Falls can cause injuries. They can happen to people of all ages. There are many things you can do to make your home safe and to help prevent falls. What can I do on the outside of my home?  Regularly fix the edges of walkways and driveways and fix any cracks.  Remove anything that might make you trip as you walk through a door, such as a raised step or threshold.  Trim any bushes or trees on the path to your home.  Use bright outdoor lighting.  Clear any walking paths of anything that might make someone trip, such as rocks or tools.  Regularly check to see if handrails are loose or  broken. Make sure that both sides of any steps have handrails.  Any raised decks and porches should have guardrails on the edges.  Have any leaves, snow, or ice cleared regularly.  Use sand or salt on walking paths during winter.  Clean up any spills in your garage right away. This includes oil or grease spills. What can I do in the bathroom?  Use night lights.  Install grab bars by the toilet and in the tub and shower. Do not use towel bars as grab bars.  Use non-skid mats or decals in the tub or shower.  If you need to sit down in the shower, use a plastic, non-slip stool.  Keep the floor dry. Clean up any water that spills on the floor as soon as it happens.  Remove soap buildup in the tub or shower regularly.  Attach bath mats securely with double-sided non-slip rug tape.  Do not have throw rugs and other things on the floor that can make you trip. What can I do in the bedroom?  Use night lights.  Make sure that you have a light by your bed that is easy to reach.  Do not use any sheets or blankets that are too big for your bed. They should not hang down onto the floor.  Have a firm chair that has side arms. You can use this for support while you get dressed.  Do not have throw rugs and other things on the floor that can make you trip. What can I do in the kitchen?  Clean up any spills right away.  Avoid walking on wet floors.  Keep items that you use a lot in easy-to-reach places.  If you need to reach something above you, use a strong step stool that has a grab bar.  Keep electrical cords out of the way.  Do not use floor polish or wax that makes floors slippery. If you must use wax, use non-skid floor wax.  Do not have throw rugs and other things on the floor that can make you trip. What can I do with my stairs?  Do not leave any items on the stairs.  Make sure that there are handrails on both sides of the stairs and use them. Fix handrails that are  broken or loose. Make sure that handrails are as long as the stairways.  Check any carpeting to make sure that it is firmly attached to the stairs. Fix any carpet that is loose or worn.  Avoid having throw rugs at the top or bottom of the stairs. If you do have throw rugs, attach them to the floor with carpet tape.  Make sure that you have a light switch at the top of the stairs and the bottom of the stairs. If you do not have them, ask someone to add them for you. What else can I do to help prevent falls?  Wear shoes that:  Do not have high heels.  Have rubber bottoms.  Are comfortable and fit you well.  Are closed at the toe. Do not wear sandals.  If you use a stepladder:  Make sure that it is fully opened. Do not climb a closed stepladder.  Make sure that both sides of the stepladder are locked into place.  Ask someone to hold it for you, if possible.  Clearly mark and make sure that you can see:  Any grab bars or handrails.  First and last steps.  Where the edge of each step is.  Use tools that help you move around (mobility aids) if they are needed. These include:  Canes.  Walkers.  Scooters.  Crutches.  Turn on the lights when you go into a dark area. Replace any light bulbs as soon as they burn out.  Set up your furniture so you have a clear path. Avoid moving your furniture around.  If any of your floors are uneven, fix them.  If there are any pets around you, be aware of where they are.  Review your medicines with your doctor. Some medicines can make you feel dizzy. This can increase your chance of falling. Ask your doctor what other things that you can do to help prevent falls. This information is not intended to replace advice given to you by your health care provider. Make sure you discuss any questions you have with your health care provider. Document Released: 06/26/2009 Document Revised: 02/05/2016 Document Reviewed: 10/04/2014 Elsevier  Interactive Patient Education  2017 [employer].

## 2020-05-24 NOTE — Assessment & Plan Note (Addendum)
stable overall by history and exam, recent data reviewed with pt, and pt to continue medical treatment as before,  to f/u any worsening symptoms or concerns  I spent 31 minutes in preparing to see the patient by review of recent labs, imaging and procedures, obtaining and reviewing separately obtained history, communicating with the patient and family or caregiver, ordering medications, tests or procedures, and documenting clinical information in the EHR including the differential Dx, treatment, and any further evaluation and other management of hyperglycemia, hld, elev lft, depression

## 2020-05-24 NOTE — Assessment & Plan Note (Addendum)
Suspect related to etoh or fatty liver, for f/u lab

## 2020-05-24 NOTE — Assessment & Plan Note (Signed)
stable overall by history and exam, recent data reviewed with pt, and pt to continue medical treatment as before,  to f/u any worsening symptoms or concerns  

## 2020-05-26 ENCOUNTER — Encounter: Payer: Self-pay | Admitting: Internal Medicine

## 2020-05-31 ENCOUNTER — Encounter: Payer: Self-pay | Admitting: Dermatology

## 2020-05-31 NOTE — Progress Notes (Addendum)
   Follow-Up Visit   Subjective  Manuel Cruz is a 74 y.o. male who presents for the following: Follow-up (yearly skin check).  New bumps on penis, crusts on face Location:  Duration:  Quality:  Associated Signs/Symptoms: Modifying Factors:  Severity:  Timing: Context:   Objective  Well appearing patient in no apparent distress; mood and affect are within normal limits.  A full examination was performed including scalp, head, eyes, ears, nose, lips, neck, chest, axillae, abdomen, back, buttocks, bilateral upper extremities, bilateral lower extremities, hands, feet, fingers, toes, fingernails, and toenails. All findings within normal limits unless otherwise noted below.   Assessment & Plan   Routine follow-up visit for Manuel Cruz date of birth 13-Apr-1946.  Several skin issues discussed.  He has a new lump on the back of his right shoulder which represents an open epidermoid cyst.  Small closed epidermoid cysts are on the left neck laterally and posteriorly.  These are invariably benign and if Manuel Cruz should want them surgically removed he will schedule 30 minutes each.  There are no atypical moles or skin cancer from the waist up.  He has diffuse sun damage on the face particularly right outer eyebrow, forehead, and left cheek.  He is good candidate in the winter to either do 1 to 2 months of [place] cream or bluelight PDT therapy.  A PDT brochure provided and if he wants to schedule this it will be in the early morning sometime between late October and February.  Finally he has some new bumps on the penis and upper scrotum.  These are verrucous 4 to 5 mm pink papules which are new genital warts.  Multiple treatments discussed and we elected to treat the spots with 6-second liquid nitrogen freeze; these will swell and may peel in the next 2 weeks.  Other than common sense like no marathon bicycle ride, no special care.  There is also a patch of slightly inflamed pink spots on the  head of the penis and the left shaft.  He was unaware of these and there is no history of HSV but if these persist I asked him to get back to me.  Routine follow-up will be scheduled in the winter. Condylomata acuminata in men (2) Corona of Penis; Posterior Scrotum  After patient consent, 5-second close LN2 freeze to all papules. He understands these may swell and he will and in addition to treated spots not always clearing, this does not prevent new warts from appearing.  Destruction of lesion - Corona of Penis, Posterior Scrotum Complexity: simple   Destruction method: cryotherapy   Informed consent: discussed and consent obtained   Timeout:  patient name, date of birth, surgical site, and procedure verified Lesion destroyed using liquid nitrogen: Yes   Region frozen until ice ball extended beyond lesion: Yes   Cryotherapy cycles:  5 Outcome: patient tolerated procedure well with no complications   Post-procedure details: wound care instructions given    AK (actinic keratosis) (2) Left Forehead; Right Forehead  Bluelight PDT in the late fall.  Nevus Mid Back     I, Lavonna Monarch, MD, have reviewed all documentation for this visit.  The documentation on 07/02/20 for the exam, diagnosis, procedures, and orders are all accurate and complete.

## 2020-06-02 DIAGNOSIS — Z23 Encounter for immunization: Secondary | ICD-10-CM | POA: Diagnosis not present

## 2020-06-06 NOTE — Addendum Note (Signed)
Addended by: Ashok Cordia B on: 06/06/2020 10:06 AM   Modules accepted: Orders

## 2020-06-10 ENCOUNTER — Other Ambulatory Visit: Payer: Self-pay

## 2020-06-10 ENCOUNTER — Telehealth: Payer: Self-pay | Admitting: Emergency Medicine

## 2020-06-10 MED ORDER — CALCITONIN (SALMON) 200 UNIT/ACT NA SOLN: NASAL | 3 refills | Status: DC

## 2020-06-10 NOTE — Telephone Encounter (Signed)
Pt is requesting a 90 day supply of calcitonin, salmon, (MIACALCIN/FORTICAL) 200 UNIT/ACT nasal spray. [facility]. Please give patient a call to let them know this has been called in thanks.

## 2020-06-16 ENCOUNTER — Encounter: Payer: Self-pay | Admitting: Gastroenterology

## 2020-06-16 DIAGNOSIS — Z23 Encounter for immunization: Secondary | ICD-10-CM | POA: Diagnosis not present

## 2020-06-16 NOTE — Telephone Encounter (Signed)
Patient's wife is calling to follow up on if he is going to send in a 90 day supply of the following medication.

## 2020-06-17 MED ORDER — CALCITONIN (SALMON) 200 UNIT/ACT NA SOLN
NASAL | 3 refills | Status: DC
Start: 2020-06-17 — End: 2020-07-11

## 2020-06-17 NOTE — Telephone Encounter (Signed)
Done erx 

## 2020-06-17 NOTE — Telephone Encounter (Signed)
Sent to Dr. John to advise. 

## 2020-07-10 ENCOUNTER — Telehealth: Payer: Self-pay | Admitting: Internal Medicine

## 2020-07-10 NOTE — Telephone Encounter (Signed)
   Patient requesting 3 month supply of calcitonin, salmon, (MIACALCIN/FORTICAL) 200 UNIT/ACT nasal spray Please send to local pharmacy [id number] - [place], [address]. AT [address]

## 2020-07-11 ENCOUNTER — Other Ambulatory Visit: Payer: Self-pay

## 2020-07-11 MED ORDER — CALCITONIN (SALMON) 200 UNIT/ACT NA SOLN: NASAL | 3 refills | Status: DC

## 2020-07-29 ENCOUNTER — Other Ambulatory Visit: Payer: Self-pay

## 2020-07-29 ENCOUNTER — Encounter: Payer: Self-pay | Admitting: Dermatology

## 2020-07-29 ENCOUNTER — Ambulatory Visit (INDEPENDENT_AMBULATORY_CARE_PROVIDER_SITE_OTHER): Payer: Medicare Other | Admitting: Dermatology

## 2020-07-29 DIAGNOSIS — A630 Anogenital (venereal) warts: Secondary | ICD-10-CM | POA: Diagnosis not present

## 2020-07-29 DIAGNOSIS — I2510 Atherosclerotic heart disease of native coronary artery without angina pectoris: Secondary | ICD-10-CM | POA: Diagnosis not present

## 2020-07-29 DIAGNOSIS — Q828 Other specified congenital malformations of skin: Secondary | ICD-10-CM

## 2020-07-29 NOTE — Progress Notes (Signed)
   Follow-Up Visit   Subjective  Manuel Cruz is a 74 y.o. male who presents for the following: Follow-up (groin- seems better- condylomata acuminata ).  Warts Location: Penile shaft Duration:  Quality: Some improvement but not clear Associated Signs/Symptoms: Modifying Factors:  Severity:  Timing: Context: Would like remaining spots treated  Objective  Well appearing patient in no apparent distress; mood and affect are within normal limits.  A focused examination was performed including Head, groin.. Relevant physical exam findings are noted in the Assessment and Plan.   Assessment & Plan   LN2 6 seconds for each of seven 27mm tiny warts on the left mid shaft of the penis.  We also discussed 2 dozen 2 mm light rough spots on the lower legs ankle area called acro keratosis verruciformis. Condylomata acuminata in men Dorsal Penile Shaft  Each papule treated with liquid nitrogen spray freeze 1 to 2 seconds x 4.  Told to expect some swelling to try to minimize trauma for the next few days.  If all spots are clear, may cancel follow-up.  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 I reviewed with Manuel Cruz the nonexistence of improving antiviral for HPV and the possibility that even if we can are able to achieve visible clearing this may not represent true cure for his warts.    I, Lavonna Monarch, MD, have reviewed all documentation for this visit.  The documentation on 08/01/20 for the exam, diagnosis, procedures, and orders are all accurate and complete.

## 2020-07-30 ENCOUNTER — Encounter: Payer: Self-pay | Admitting: Dermatology

## 2020-08-27 ENCOUNTER — Other Ambulatory Visit: Payer: Self-pay

## 2020-08-27 ENCOUNTER — Ambulatory Visit (AMBULATORY_SURGERY_CENTER): Payer: Self-pay

## 2020-08-27 VITALS — Ht 68.0 in | Wt 150.0 lb

## 2020-08-27 DIAGNOSIS — Z86010 Personal history of colonic polyps: Secondary | ICD-10-CM

## 2020-08-27 MED ORDER — PLENVU 140 G PO SOLR: 1.0000 | ORAL | 0 refills | Status: DC

## 2020-08-27 NOTE — Progress Notes (Signed)

## 2020-09-10 ENCOUNTER — Other Ambulatory Visit: Payer: Self-pay

## 2020-09-10 ENCOUNTER — Encounter: Payer: Self-pay | Admitting: Gastroenterology

## 2020-09-10 ENCOUNTER — Ambulatory Visit (AMBULATORY_SURGERY_CENTER): Payer: Medicare Other | Admitting: Gastroenterology

## 2020-09-10 VITALS — BP 125/74 | HR 79 | Temp 96.2°F | Resp 12 | Ht 68.0 in | Wt 150.0 lb

## 2020-09-10 DIAGNOSIS — K6289 Other specified diseases of anus and rectum: Secondary | ICD-10-CM

## 2020-09-10 DIAGNOSIS — Z86010 Personal history of colonic polyps: Secondary | ICD-10-CM | POA: Diagnosis not present

## 2020-09-10 DIAGNOSIS — D122 Benign neoplasm of ascending colon: Secondary | ICD-10-CM | POA: Diagnosis not present

## 2020-09-10 DIAGNOSIS — D120 Benign neoplasm of cecum: Secondary | ICD-10-CM

## 2020-09-10 MED ORDER — SODIUM CHLORIDE 0.9 % IV SOLN: 500.0000 mL | Freq: Once | INTRAVENOUS | Status: DC

## 2020-09-10 NOTE — Patient Instructions (Signed)
HANDOUTS PROVIDED ON: polyps, hemorrhoids, diverticulosis  The polyps removed/biopsies taken today have been sent for pathology.  The results can take 1-3 weeks to receive.  When your next colonoscopy should occur will be based on the pathology results.    You may resume your previous diet and medication schedule.  Thank you for allowing [place] to care for you today!!!   YOU HAD AN ENDOSCOPIC PROCEDURE TODAY AT THE [facility]:   Refer to the procedure report that was given to you for any specific questions about what was found during the examination.  If the procedure report does not answer your questions, please call your gastroenterologist to clarify.  If you requested that your care partner not be given the details of your procedure findings, then the procedure report has been included in a sealed envelope for you to review at your convenience later.  YOU SHOULD EXPECT: Some feelings of bloating in the abdomen. Passage of more gas than usual.  Walking can help get rid of the air that was put into your GI tract during the procedure and reduce the bloating. If you had a lower endoscopy (such as a colonoscopy or flexible sigmoidoscopy) you may notice spotting of blood in your stool or on the toilet paper. If you underwent a bowel prep for your procedure, you may not have a normal bowel movement for a few days.  Please Note:  You might notice some irritation and congestion in your nose or some drainage.  This is from the oxygen used during your procedure.  There is no need for concern and it should clear up in a day or so.  SYMPTOMS TO REPORT IMMEDIATELY:   Following lower endoscopy (colonoscopy or flexible sigmoidoscopy):  Excessive amounts of blood in the stool  Significant tenderness or worsening of abdominal pains  Swelling of the abdomen that is new, acute  Fever of 100F or higher   For urgent or emergent issues, a gastroenterologist can be reached at any hour by calling [phone]  [phone]. Do not use MyChart messaging for urgent concerns.    DIET:  We do recommend a small meal at first, but then you may proceed to your regular diet.  Drink plenty of fluids but you should avoid alcoholic beverages for 24 hours.  ACTIVITY:  You should plan to take it easy for the rest of today and you should NOT DRIVE or use heavy machinery until tomorrow (because of the sedation medicines used during the test).    FOLLOW UP: Our staff will call the number listed on your records 48-72 hours following your procedure to check on you and address any questions or concerns that you may have regarding the information given to you following your procedure. If we do not reach you, we will leave a message.  We will attempt to reach you two times.  During this call, we will ask if you have developed any symptoms of COVID 19. If you develop any symptoms (ie: fever, flu-like symptoms, shortness of breath, cough etc.) before then, please call [phone].  If you test positive for Covid 19 in the 2 weeks post procedure, please call and report this information to [place].    If any biopsies were taken you will be contacted by phone or by letter within the next 1-3 weeks.  Please call us at [phone] if you have not heard about the biopsies in 3 weeks.    SIGNATURES/CONFIDENTIALITY: You and/or your care partner have signed paperwork which  will be entered into your electronic medical record.  These signatures attest to the fact that that the information above on your After Visit Summary has been reviewed and is understood.  Full responsibility of the confidentiality of this discharge information lies with you and/or your care-partner.

## 2020-09-10 NOTE — Op Note (Signed)
[facility] Patient Name: Manuel Cruz Procedure Date: 09/10/2020 10:17 AM MRN: [id number] Endoscopist: Remo Lipps P. Havery Moros , MD Age: 74 Referring MD:  Date of Birth: [birth year] Gender: Male Account #: [id number] Procedure:                Colonoscopy Indications:              High risk colon cancer surveillance: Personal                            history of colonic polyps (adenomas 2016) Medicines:                Monitored Anesthesia Care Procedure:                Pre-Anesthesia Assessment:                           - Prior to the procedure, a History and Physical                            was performed, and patient medications and                            allergies were reviewed. The patient's tolerance of                            previous anesthesia was also reviewed. The risks                            and benefits of the procedure and the sedation                            options and risks were discussed with the patient.                            All questions were answered, and informed consent                            was obtained. Prior Anticoagulants: The patient has                            taken no previous anticoagulant or antiplatelet                            agents. ASA Grade Assessment: III - A patient with                            severe systemic disease. After reviewing the risks                            and benefits, the patient was deemed in                            satisfactory condition to undergo the procedure.  After obtaining informed consent, the colonoscope                            was passed under direct vision. Throughout the                            procedure, the patient's blood pressure, pulse, and                            oxygen saturations were monitored continuously. The                            Olympus CF-HQ190 [id number]) Colonoscope was                            introduced through  the anus and advanced to the the                            cecum, identified by appendiceal orifice and                            ileocecal valve. The colonoscopy was performed                            without difficulty. The patient tolerated the                            procedure well. The quality of the bowel                            preparation was good. The ileocecal valve,                            appendiceal orifice, and rectum were photographed. Scope In: 10:44:17 AM Scope Out: 11:00:44 AM Scope Withdrawal Time: 0 hours 14 minutes 12 seconds  Total Procedure Duration: 0 hours 16 minutes 27 seconds  Findings:                 The perianal and digital rectal examinations were                            normal.                           Two sessile polyps were found in the cecum. The                            polyps were 3 mm in size. These polyps were removed                            with a cold snare. Resection and retrieval were                            complete.  A few small-mouthed diverticula were found in the                            sigmoid colon.                           Anal papilla(e) were hypertrophied. Biopsies were                            taken with a cold forceps for histology to rule out                            AIN.                           Internal hemorrhoids were found during retroflexion.                           The exam was otherwise without abnormality. Complications:            No immediate complications. Estimated blood loss:                            Minimal. Estimated Blood Loss:     Estimated blood loss was minimal. Impression:               - Two 3 mm polyps in the cecum, removed with a cold                            snare. Resected and retrieved.                           - Diverticulosis in the sigmoid colon.                           - Anal papilla(e) were hypertrophied. Biopsied.                            - Internal hemorrhoids.                           - The examination was otherwise normal. Recommendation:           - Patient has a contact number available for                            emergencies. The signs and symptoms of potential                            delayed complications were discussed with the                            patient. Return to normal activities tomorrow.                            Written discharge instructions were provided to the  patient.                           - Resume previous diet.                           - Continue present medications.                           - Await pathology results. Remo Lipps P. Taliya Mcclard, MD 09/10/2020 11:05:45 AM This report has been signed electronically.

## 2020-09-10 NOTE — Progress Notes (Signed)
Called to room to assist during endoscopic procedure.  Patient ID and intended procedure confirmed with present staff. Received instructions for my participation in the procedure from the performing physician.  

## 2020-09-10 NOTE — Progress Notes (Signed)
Pt's states no medical or surgical changes since previsit or office visit.  ° °VS AG °

## 2020-09-10 NOTE — Progress Notes (Signed)
PT taken to PACU. Monitors in place. VSS. Report given to RN. 

## 2020-09-15 ENCOUNTER — Telehealth: Payer: Self-pay | Admitting: *Deleted

## 2020-09-15 NOTE — Telephone Encounter (Signed)
  Follow up Call-  Call back number 09/10/2020  Post procedure Call Back phone  # [phone]  Permission to leave phone message Yes  Some recent data might be hidden     Patient questions:  Do you have a fever, pain , or abdominal swelling? No. Pain Score  0 *  Have you tolerated food without any problems? Yes.    Have you been able to return to your normal activities? Yes.    Do you have any questions about your discharge instructions: Diet   No. Medications  No. Follow up visit  No.  Do you have questions or concerns about your Care? No.  Actions: * If pain score is 4 or above: No action needed, pain <4.  1. Have you developed a fever since your procedure? no  2.   Have you had an respiratory symptoms (SOB or cough) since your procedure? no  3.   Have you tested positive for COVID 19 since your procedure no  4.   Have you had any family members/close contacts diagnosed with the COVID 19 since your procedure?  no   If yes to any of these questions please route to Laverna Peace, RN and Karlton Lemon, RN

## 2020-10-03 ENCOUNTER — Other Ambulatory Visit: Payer: Self-pay | Admitting: Internal Medicine

## 2020-10-27 ENCOUNTER — Other Ambulatory Visit: Payer: Self-pay | Admitting: Internal Medicine

## 2020-11-19 ENCOUNTER — Ambulatory Visit: Payer: Medicare Other | Admitting: Internal Medicine

## 2020-11-22 ENCOUNTER — Other Ambulatory Visit: Payer: Self-pay | Admitting: Internal Medicine

## 2020-11-26 ENCOUNTER — Other Ambulatory Visit: Payer: Self-pay

## 2020-11-27 ENCOUNTER — Other Ambulatory Visit: Payer: Self-pay | Admitting: Internal Medicine

## 2020-11-27 ENCOUNTER — Encounter: Payer: Self-pay | Admitting: Internal Medicine

## 2020-11-27 ENCOUNTER — Ambulatory Visit (INDEPENDENT_AMBULATORY_CARE_PROVIDER_SITE_OTHER): Payer: Medicare Other | Admitting: Internal Medicine

## 2020-11-27 VITALS — BP 118/70 | HR 85 | Temp 98.3°F | Ht 68.0 in | Wt 152.0 lb

## 2020-11-27 DIAGNOSIS — E559 Vitamin D deficiency, unspecified: Secondary | ICD-10-CM | POA: Diagnosis not present

## 2020-11-27 DIAGNOSIS — F17200 Nicotine dependence, unspecified, uncomplicated: Secondary | ICD-10-CM | POA: Diagnosis not present

## 2020-11-27 DIAGNOSIS — Z7289 Other problems related to lifestyle: Secondary | ICD-10-CM | POA: Diagnosis not present

## 2020-11-27 DIAGNOSIS — E538 Deficiency of other specified B group vitamins: Secondary | ICD-10-CM | POA: Diagnosis not present

## 2020-11-27 DIAGNOSIS — G8929 Other chronic pain: Secondary | ICD-10-CM | POA: Diagnosis not present

## 2020-11-27 DIAGNOSIS — F32A Depression, unspecified: Secondary | ICD-10-CM

## 2020-11-27 DIAGNOSIS — F1090 Alcohol use, unspecified, uncomplicated: Secondary | ICD-10-CM

## 2020-11-27 DIAGNOSIS — R739 Hyperglycemia, unspecified: Secondary | ICD-10-CM

## 2020-11-27 DIAGNOSIS — R7989 Other specified abnormal findings of blood chemistry: Secondary | ICD-10-CM

## 2020-11-27 DIAGNOSIS — M5440 Lumbago with sciatica, unspecified side: Secondary | ICD-10-CM | POA: Diagnosis not present

## 2020-11-27 DIAGNOSIS — R945 Abnormal results of liver function studies: Secondary | ICD-10-CM

## 2020-11-27 DIAGNOSIS — N320 Bladder-neck obstruction: Secondary | ICD-10-CM

## 2020-11-27 DIAGNOSIS — E785 Hyperlipidemia, unspecified: Secondary | ICD-10-CM | POA: Diagnosis not present

## 2020-11-27 LAB — CBC WITH DIFFERENTIAL/PLATELET
Basophils Absolute: 0.1 10*3/uL (ref 0.0–0.1)
Basophils Relative: 1.3 % (ref 0.0–3.0)
Eosinophils Absolute: 0.1 10*3/uL (ref 0.0–0.7)
Eosinophils Relative: 1.8 % (ref 0.0–5.0)
HCT: 41.8 % (ref 39.0–52.0)
Hemoglobin: 14.2 g/dL (ref 13.0–17.0)
Lymphocytes Relative: 36.9 % (ref 12.0–46.0)
Lymphs Abs: 2.2 10*3/uL (ref 0.7–4.0)
MCHC: 33.9 g/dL (ref 30.0–36.0)
MCV: 105.3 fl — ABNORMAL HIGH (ref 78.0–100.0)
Monocytes Absolute: 0.9 10*3/uL (ref 0.1–1.0)
Monocytes Relative: 14.1 % — ABNORMAL HIGH (ref 3.0–12.0)
Neutro Abs: 2.8 10*3/uL (ref 1.4–7.7)
Neutrophils Relative %: 45.9 % (ref 43.0–77.0)
Platelets: 219 10*3/uL (ref 150.0–400.0)
RBC: 3.97 Mil/uL — ABNORMAL LOW (ref 4.22–5.81)
RDW: 13.7 % (ref 11.5–15.5)
WBC: 6.1 10*3/uL (ref 4.0–10.5)

## 2020-11-27 LAB — URINALYSIS, ROUTINE W REFLEX MICROSCOPIC
Bilirubin Urine: NEGATIVE
Hgb urine dipstick: NEGATIVE
Ketones, ur: NEGATIVE
Leukocytes,Ua: NEGATIVE
Nitrite: NEGATIVE
Specific Gravity, Urine: 1.025 (ref 1.000–1.030)
Total Protein, Urine: NEGATIVE
Urine Glucose: NEGATIVE
Urobilinogen, UA: 0.2 (ref 0.0–1.0)
pH: 5.5 (ref 5.0–8.0)

## 2020-11-27 LAB — BASIC METABOLIC PANEL
BUN: 12 mg/dL (ref 6–23)
CO2: 27 mEq/L (ref 19–32)
Calcium: 9.4 mg/dL (ref 8.4–10.5)
Chloride: 104 mEq/L (ref 96–112)
Creatinine, Ser: 0.71 mg/dL (ref 0.40–1.50)
GFR: 91.13 mL/min (ref >60.00)
Glucose, Bld: 90 mg/dL (ref 70–99)
Potassium: 4.2 mEq/L (ref 3.5–5.1)
Sodium: 140 mEq/L (ref 135–145)

## 2020-11-27 LAB — LIPID PANEL
Cholesterol: 153 mg/dL (ref 0–200)
HDL: 75.5 mg/dL (ref >39.00)
Total CHOL/HDL Ratio: 2
Triglycerides: 429 mg/dL — ABNORMAL HIGH (ref 0.0–149.0)

## 2020-11-27 LAB — LDL CHOLESTEROL, DIRECT: Direct LDL: 42 mg/dL

## 2020-11-27 LAB — HEPATIC FUNCTION PANEL
ALT: 76 U/L — ABNORMAL HIGH (ref 0–53)
AST: 76 U/L — ABNORMAL HIGH (ref 0–37)
Albumin: 4 g/dL (ref 3.5–5.2)
Alkaline Phosphatase: 82 U/L (ref 39–117)
Bilirubin, Direct: 0.1 mg/dL (ref 0.0–0.3)
Total Bilirubin: 0.5 mg/dL (ref 0.2–1.2)
Total Protein: 7 g/dL (ref 6.0–8.3)

## 2020-11-27 LAB — PSA: PSA: 1.39 ng/mL (ref 0.10–4.00)

## 2020-11-27 LAB — HEMOGLOBIN A1C: Hgb A1c MFr Bld: 5.9 % (ref 4.6–6.5)

## 2020-11-27 LAB — VITAMIN B12: Vitamin B-12: 401 pg/mL (ref 211–911)

## 2020-11-27 LAB — TSH: TSH: 1.44 u[IU]/mL (ref 0.35–4.50)

## 2020-11-27 LAB — VITAMIN D 25 HYDROXY (VIT D DEFICIENCY, FRACTURES): VITD: 56.06 ng/mL (ref 30.00–100.00)

## 2020-11-27 NOTE — Progress Notes (Signed)
Patient ID: Manuel Cruz, male   DOB: 1946/07/07, 76 y.o.   MRN: [id number]        Chief Complaint: follow up HLD, hyperglycemia, neuropathy with chronic LBP, depression, and ongoing ETOH use       HPI:  Manuel Cruz is a 75 y.o. male here to state overall stable ETOH use, walking 1 mile per day now and using "Rebuilder" water tx for the feet which seems to help the neuoropathy pain.  Pt continues to have recurring LBP without change in severity, bowel or bladder change, fever, wt loss,  worsening LE pain/numbness/weakness, gait change or falls.  Also had recent PT and taking B complex MVI.  Has seen surgury and declines fusion surgury for now as pain is tolerable.  Pt denies chest pain, increased sob or doe, wheezing, orthopnea, PND, increased LE swelling, palpitations, dizziness or syncope.   Pt denies polydipsia, polyuria,  Denies new worsening focal neuro s/s.   Pt denies fever, wt loss, night sweats, loss of appetite, or other constitutional symptoms  Denies worsening depressive symptoms, suicidal ideation, or panic  No other new complaints Wt Readings from Last 3 Encounters:  11/27/20 152 lb (68.9 kg)  09/10/20 150 lb (68 kg)  08/27/20 150 lb (68 kg)   BP Readings from Last 3 Encounters:  11/27/20 118/70  09/10/20 125/74  05/22/20 130/80         Past Medical History:  Diagnosis Date  . Allergic rhinitis   . Allergy    seasonal allergies  . GERD (gastroesophageal reflux disease)    on meds  . Hyperlipidemia    on meds  . Osteopenia 2021  . Peripheral neuropathy   . Sciatica of right side    recurrent  . Sleep apnea    pt denies  . 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 Squamous cell carcinoma in situ (SCCIS) 02/01/2012   Left Sideburn (tx p bx)  . Squamous cell carcinoma in situ (SCCIS) 04/05/2016   Left Forearm(Cx3,5FU), Left Sideburn Sup(Cx3,5FU), and Left Sideburn Inf (Cx3,5FU)  . Squamous cell carcinoma in situ (SCCIS)  05/06/2016   Left Side Neck (tx p bx)  . Squamous cell skin cancer, earlobe   . Warts, genital    Past Surgical History:  Procedure Laterality Date  . COLONOSCOPY  08/12/2005   SA-MAC-(good)suprep-TA/tics  . [place] SURGERY  2004  . [place] SURGERY  05/2018   for comression fx  . MOUTH SURGERY  2019   implants  . TONSILLECTOMY      reports that he has been smoking cigarettes. He has been smoking about 0.25 packs per day. He has never used smokeless tobacco. He reports current alcohol use of about 20.0 standard drinks of alcohol per week. He reports that he does not use drugs. family history includes Bladder Cancer (age of onset: 86) in his father; Brain cancer (age of onset: 8) in his mother; Hernia in his brother; Hyperlipidemia in his father; Lung cancer (age of onset: 89) in his mother; Lymphoma (age of onset: 77) in his father. No Known Allergies Current Outpatient Medications on File Prior to Visit  Medication Sig Dispense Refill  . aspirin 81 MG EC tablet Take 1 tablet (81 mg total) by mouth daily. 90 tablet 1  . atorvastatin (LIPITOR) 80 MG tablet TAKE 1 TABLET DAILY 90 tablet 3  . b complex vitamins tablet Take 1 tablet by mouth daily.    Marland Kitchen  calcitonin, salmon, (MIACALCIN/FORTICAL) 200 UNIT/ACT nasal spray SPRAY 1 SPRAY INTO EACH NOSTRIL EVERY DAY 11.1 mL 1  . Carboxymeth-Glycerin-Polysorb (REFRESH OPTIVE ADVANCED OP) Apply 1 drop to eye daily.    Marland Kitchen gabapentin (NEURONTIN) 300 MG capsule TAKE 2 CAPSULES 3 TIMES A  DAY 540 capsule 1  . Glucosamine-Chondroitin (MOVE FREE PO) Take by mouth daily.    Marland Kitchen ketotifen (ZADITOR) 0.025 % ophthalmic solution Place 1 drop into both eyes 2 (two) times daily as needed.    . Loratadine (CLARITIN PO) Take by mouth daily.    . Melatonin 3 MG TABS Take 3 mg by mouth at bedtime.    . Multiple Vitamin (MULTIVITAMIN) tablet Take 1 tablet by mouth daily.    . naproxen (NAPROSYN) 250 MG tablet Take by mouth daily as needed.    Marland Kitchen omeprazole  (PRILOSEC) 20 MG capsule Take 1 capsule (20 mg total) by mouth daily. (Patient taking differently: Take 20 mg by mouth every other day.) 90 capsule 3  . Probiotic Product (PROBIOTIC PO) Take by mouth daily.     No current facility-administered medications on file prior to visit.        ROS:  All others reviewed and negative.  Objective        PE:  BP 118/70   Pulse 85   Temp 98.3 F (36.8 C) (Oral)   Ht _0  (1.727 m)   Wt 152 lb (68.9 kg)   SpO2 96%   BMI 23.11 kg/m                 Constitutional: Pt appears in NAD               HENT: Head: NCAT.                Right Ear: External ear normal.                 Left Ear: External ear normal.                Eyes: . Pupils are equal, round, and reactive to light. Conjunctivae and EOM are normal               Nose: without d/c or deformity               Neck: Neck supple. Gross normal ROM               Cardiovascular: Normal rate and regular rhythm.                 Pulmonary/Chest: Effort normal and breath sounds without rales or wheezing.                Abd:  Soft, NT, ND, + BS, no organomegaly               Neurological: Pt is alert. At baseline orientation, motor grossly intact               Skin: Skin is warm. No rashes, no other new lesions, LE edema - trace bilateral               Psychiatric: Pt behavior is normal without agitation   Micro: none  Cardiac tracings I have personally interpreted today:  none  Pertinent Radiological findings (summarize): none   Lab Results  Component Value Date   WBC 6.1 11/27/2020   HGB 14.2 11/27/2020   HCT 41.8 11/27/2020   PLT 219.0 11/27/2020   GLUCOSE 90  11/27/2020   CHOL 153 11/27/2020   TRIG (H) 11/27/2020    429.0 Triglyceride is over 400; calculations on Lipids are invalid.   HDL 75.50 11/27/2020   LDLDIRECT 42.0 11/27/2020   LDLCALC 59 05/22/2020   ALT 76 (H) 11/27/2020   AST 76 (H) 11/27/2020   NA 140 11/27/2020   K 4.2 11/27/2020   CL 104 11/27/2020   CREATININE  0.71 11/27/2020   BUN 12 11/27/2020   CO2 27 11/27/2020   TSH 1.44 11/27/2020   PSA 1.39 11/27/2020   HGBA1C 5.9 11/27/2020   Assessment/Plan:  Manuel Cruz is a 75 y.o. White or Caucasian [1] male with  has a past medical history of Allergic rhinitis, Allergy, GERD (gastroesophageal reflux disease), Hyperlipidemia, Osteopenia (2021), Peripheral neuropathy, Sciatica of right side, Sleep apnea, Squamous cell carcinoma in situ (SCCIS) (06/15/2005), Squamous cell carcinoma in situ (SCCIS) (02/01/2012), Squamous cell carcinoma in situ (SCCIS) (04/05/2016), Squamous cell carcinoma in situ (SCCIS) (05/06/2016), Squamous cell skin cancer, earlobe, and Warts, genital.  Depression Stable overall, declines need for SSRI  Hyperglycemia Lab Results  Component Value Date   HGBA1C 5.9 11/27/2020   Stable, pt to continue current medical treatment  - diet, wt control   Hyperlipidemia Lab Results  Component Value Date   LDLCALC 59 05/22/2020   Stable, pt to continue current statin  - lipitor 80   Habitual alcohol use Stable overall, encourage to quit  Low back pain Stable overall, to f/u surgury for fusion if worsening  Smoker Urged to quit,  Pt not ready, to f/u any worsening symptoms or concerns  Followup: Return in about 6 months (around 05/30/2021).  Cathlean Cower, MD 11/30/2020 9:28 PM [facility]

## 2020-11-27 NOTE — Patient Instructions (Signed)

## 2020-11-28 ENCOUNTER — Other Ambulatory Visit: Payer: Self-pay | Admitting: Internal Medicine

## 2020-11-28 DIAGNOSIS — R7989 Other specified abnormal findings of blood chemistry: Secondary | ICD-10-CM

## 2020-11-28 DIAGNOSIS — R945 Abnormal results of liver function studies: Secondary | ICD-10-CM

## 2020-11-30 ENCOUNTER — Encounter: Payer: Self-pay | Admitting: Internal Medicine

## 2020-11-30 NOTE — Assessment & Plan Note (Signed)
Lab Results  Component Value Date   LDLCALC 59 05/22/2020   Stable, pt to continue current statin  - lipitor 80

## 2020-11-30 NOTE — Assessment & Plan Note (Signed)
Stable overall, encourage to quit

## 2020-11-30 NOTE — Assessment & Plan Note (Signed)
Lab Results  Component Value Date   HGBA1C 5.9 11/27/2020   Stable, pt to continue current medical treatment  - diet, wt control

## 2020-11-30 NOTE — Assessment & Plan Note (Signed)
Stable overall, to f/u surgury for fusion if worsening

## 2020-11-30 NOTE — Assessment & Plan Note (Signed)
Stable overall, declines need for SSRI

## 2020-11-30 NOTE — Assessment & Plan Note (Addendum)
Urged to quit,  Pt not ready, to f/u any worsening symptoms or concerns

## 2020-12-11 ENCOUNTER — Ambulatory Visit
Admission: RE | Admit: 2020-12-11 | Discharge: 2020-12-11 | Disposition: A | Discharge status: Home / Self Care | Payer: Medicare Other | Source: Ambulatory Visit | Attending: Internal Medicine | Admitting: Internal Medicine

## 2020-12-11 DIAGNOSIS — R7989 Other specified abnormal findings of blood chemistry: Secondary | ICD-10-CM | POA: Diagnosis not present

## 2020-12-11 DIAGNOSIS — R945 Abnormal results of liver function studies: Secondary | ICD-10-CM

## 2020-12-11 DIAGNOSIS — K760 Fatty (change of) liver, not elsewhere classified: Secondary | ICD-10-CM | POA: Diagnosis not present

## 2020-12-11 DIAGNOSIS — K8020 Calculus of gallbladder without cholecystitis without obstruction: Secondary | ICD-10-CM | POA: Diagnosis not present

## 2020-12-12 ENCOUNTER — Encounter: Payer: Self-pay | Admitting: Internal Medicine

## 2020-12-30 DIAGNOSIS — Z23 Encounter for immunization: Secondary | ICD-10-CM | POA: Diagnosis not present

## 2021-03-03 DIAGNOSIS — H43821 Vitreomacular adhesion, right eye: Secondary | ICD-10-CM | POA: Diagnosis not present

## 2021-03-03 DIAGNOSIS — H2513 Age-related nuclear cataract, bilateral: Secondary | ICD-10-CM | POA: Diagnosis not present

## 2021-03-03 DIAGNOSIS — H52223 Regular astigmatism, bilateral: Secondary | ICD-10-CM | POA: Diagnosis not present

## 2021-03-03 DIAGNOSIS — H524 Presbyopia: Secondary | ICD-10-CM | POA: Diagnosis not present

## 2021-03-03 DIAGNOSIS — H04123 Dry eye syndrome of bilateral lacrimal glands: Secondary | ICD-10-CM | POA: Diagnosis not present

## 2021-03-03 DIAGNOSIS — H35363 Drusen (degenerative) of macula, bilateral: Secondary | ICD-10-CM | POA: Diagnosis not present

## 2021-03-10 ENCOUNTER — Other Ambulatory Visit: Payer: Self-pay

## 2021-03-10 ENCOUNTER — Encounter (INDEPENDENT_AMBULATORY_CARE_PROVIDER_SITE_OTHER): Payer: Medicare Other | Admitting: Ophthalmology

## 2021-03-10 DIAGNOSIS — H353121 Nonexudative age-related macular degeneration, left eye, early dry stage: Secondary | ICD-10-CM | POA: Diagnosis not present

## 2021-03-10 DIAGNOSIS — H35071 Retinal telangiectasis, right eye: Secondary | ICD-10-CM | POA: Diagnosis not present

## 2021-03-10 DIAGNOSIS — H43813 Vitreous degeneration, bilateral: Secondary | ICD-10-CM | POA: Diagnosis not present

## 2021-03-13 DIAGNOSIS — Z20822 Contact with and (suspected) exposure to covid-19: Secondary | ICD-10-CM | POA: Diagnosis not present

## 2021-04-10 ENCOUNTER — Other Ambulatory Visit: Payer: Self-pay | Admitting: Internal Medicine

## 2021-04-14 ENCOUNTER — Telehealth: Payer: Self-pay | Admitting: Dermatology

## 2021-04-14 NOTE — Telephone Encounter (Signed)
Said someone left message to call us back

## 2021-04-14 NOTE — Telephone Encounter (Signed)
Phone call to patient's spouse [name] to inform her that no one called him yesterday.

## 2021-04-28 ENCOUNTER — Ambulatory Visit (INDEPENDENT_AMBULATORY_CARE_PROVIDER_SITE_OTHER): Payer: Medicare Other | Admitting: Dermatology

## 2021-04-28 ENCOUNTER — Other Ambulatory Visit: Payer: Self-pay

## 2021-04-28 DIAGNOSIS — Z1283 Encounter for screening for malignant neoplasm of skin: Secondary | ICD-10-CM

## 2021-04-28 DIAGNOSIS — Z85828 Personal history of other malignant neoplasm of skin: Secondary | ICD-10-CM

## 2021-04-28 DIAGNOSIS — L570 Actinic keratosis: Secondary | ICD-10-CM

## 2021-04-28 DIAGNOSIS — L821 Other seborrheic keratosis: Secondary | ICD-10-CM

## 2021-04-28 DIAGNOSIS — A630 Anogenital (venereal) warts: Secondary | ICD-10-CM | POA: Diagnosis not present

## 2021-04-28 MED ORDER — IMIQUIMOD 5 % EX CREA: TOPICAL_CREAM | CUTANEOUS | 0 refills | Status: DC

## 2021-05-10 ENCOUNTER — Encounter: Payer: Self-pay | Admitting: Dermatology

## 2021-05-10 NOTE — Progress Notes (Signed)
   Follow-Up Visit   Subjective  Manuel Cruz is a 75 y.o. male who presents for the following: Annual Exam (Patient here today for yearly skin check. Per patient he believes he's having a flare of genital warts. Personal history of non mole skin cancer. No personal history of atypical moles or melanoma. No family history of atypical moles, melanoma or non mole skin cancer. ).  New papules penis plus general skin check Location:  Duration:  Quality:  Associated Signs/Symptoms: Modifying Factors:  Severity:  Timing: Context:   Objective  Well appearing patient in no apparent distress; mood and affect are within normal limits. Dorsal Penile Shaft (12) Multiple 2 to 4 mm pink slightly verrucous papules compatible with condyloma.  Left Malar Cheek 4 mm gritty pink crust  Torso - Posterior (Back) Multiple 3 to 8 mm brown flattopped textured papules    A full examination was performed including scalp, head, eyes, ears, nose, lips, neck, chest, axillae, abdomen, back, buttocks, bilateral upper extremities, bilateral lower extremities, hands, feet, fingers, toes, fingernails, and toenails. All findings within normal limits unless otherwise noted below.   Assessment & Plan    Condylomata acuminata in men (12) Dorsal Penile Shaft  Destruction of lesion - Dorsal Penile Shaft Complexity: simple   Destruction method: cryotherapy   Informed consent: discussed and consent obtained   Timeout:  patient name, date of birth, surgical site, and procedure verified Lesion destroyed using liquid nitrogen: Yes   Cryotherapy cycles:  3 Outcome: patient tolerated procedure well with no complications   Post-procedure details: wound care instructions given    AK (actinic keratosis) Left Malar Cheek  Destruction of lesion - Left Malar Cheek Complexity: simple   Destruction method: cryotherapy   Informed consent: discussed and consent obtained   Lesion destroyed using liquid nitrogen: Yes    Cryotherapy cycles:  3 Outcome: patient tolerated procedure well with no complications    Related Medications imiquimod (ALDARA) 5 % cream Apply To Affected Area Monday, Wed, Fri x 6-8 weeks  Seborrheic keratosis Torso - Posterior (Back)  May leave if stable      I, Lavonna Monarch, MD, have reviewed all documentation for this visit.  The documentation on 05/10/21 for the exam, diagnosis, procedures, and orders are all accurate and complete.

## 2021-06-01 ENCOUNTER — Ambulatory Visit: Payer: Medicare Other | Admitting: Internal Medicine

## 2021-06-05 ENCOUNTER — Encounter: Payer: Self-pay | Admitting: Internal Medicine

## 2021-06-05 ENCOUNTER — Ambulatory Visit (INDEPENDENT_AMBULATORY_CARE_PROVIDER_SITE_OTHER): Payer: Medicare Other | Admitting: Internal Medicine

## 2021-06-05 ENCOUNTER — Other Ambulatory Visit: Payer: Self-pay

## 2021-06-05 VITALS — BP 122/70 | HR 76 | Temp 97.8°F | Ht 68.0 in | Wt 145.0 lb

## 2021-06-05 DIAGNOSIS — Z23 Encounter for immunization: Secondary | ICD-10-CM | POA: Diagnosis not present

## 2021-06-05 DIAGNOSIS — M8000XD Age-related osteoporosis with current pathological fracture, unspecified site, subsequent encounter for fracture with routine healing: Secondary | ICD-10-CM

## 2021-06-05 DIAGNOSIS — F17200 Nicotine dependence, unspecified, uncomplicated: Secondary | ICD-10-CM | POA: Diagnosis not present

## 2021-06-05 DIAGNOSIS — E781 Pure hyperglyceridemia: Secondary | ICD-10-CM

## 2021-06-05 DIAGNOSIS — F1090 Alcohol use, unspecified, uncomplicated: Secondary | ICD-10-CM

## 2021-06-05 DIAGNOSIS — Z7289 Other problems related to lifestyle: Secondary | ICD-10-CM | POA: Diagnosis not present

## 2021-06-05 DIAGNOSIS — R739 Hyperglycemia, unspecified: Secondary | ICD-10-CM

## 2021-06-05 DIAGNOSIS — G629 Polyneuropathy, unspecified: Secondary | ICD-10-CM | POA: Insufficient documentation

## 2021-06-05 DIAGNOSIS — K760 Fatty (change of) liver, not elsewhere classified: Secondary | ICD-10-CM | POA: Diagnosis not present

## 2021-06-05 LAB — HEPATIC FUNCTION PANEL
ALT: 64 U/L — ABNORMAL HIGH (ref 0–53)
AST: 58 U/L — ABNORMAL HIGH (ref 0–37)
Albumin: 4.3 g/dL (ref 3.5–5.2)
Alkaline Phosphatase: 83 U/L (ref 39–117)
Bilirubin, Direct: 0.2 mg/dL (ref 0.0–0.3)
Total Bilirubin: 0.6 mg/dL (ref 0.2–1.2)
Total Protein: 7.5 g/dL (ref 6.0–8.3)

## 2021-06-05 LAB — LIPID PANEL
Cholesterol: 164 mg/dL (ref 0–200)
HDL: 85.5 mg/dL (ref >39.00)
LDL Cholesterol: 48 mg/dL (ref 0–99)
NonHDL: 78.36
Total CHOL/HDL Ratio: 2
Triglycerides: 153 mg/dL — ABNORMAL HIGH (ref 0.0–149.0)
VLDL: 30.6 mg/dL (ref 0.0–40.0)

## 2021-06-05 LAB — BASIC METABOLIC PANEL
BUN: 13 mg/dL (ref 6–23)
CO2: 27 mEq/L (ref 19–32)
Calcium: 9.8 mg/dL (ref 8.4–10.5)
Chloride: 104 mEq/L (ref 96–112)
Creatinine, Ser: 0.8 mg/dL (ref 0.40–1.50)
GFR: 87.59 mL/min (ref >60.00)
Glucose, Bld: 80 mg/dL (ref 70–99)
Potassium: 4.1 mEq/L (ref 3.5–5.1)
Sodium: 139 mEq/L (ref 135–145)

## 2021-06-05 LAB — HEMOGLOBIN A1C: Hgb A1c MFr Bld: 5.6 % (ref 4.6–6.5)

## 2021-06-05 NOTE — Progress Notes (Signed)
Patient ID: Manuel Cruz, male   DOB: 08-14-1946, 75 y.o.   MRN: [id number]        Chief Complaint: follow up smoker, elevated triglycerides, hyperglycemia, ETOH use, fatty liver       HPI:  Manuel Cruz is a 75 y.o. male here overall doing ok, Pt denies chest pain, increased sob or doe, wheezing, orthopnea, PND, increased LE swelling, palpitations, dizziness or syncope.   Pt denies polydipsia, polyuria, or new focal neuro s/s.   Pt denies fever, wt loss, night sweats, loss of appetite, or other constitutional symptoms    Has hx of low back pain, with increase recent neuropathic numbness, has had 2 falls recent, does not think etoh related; has seen NS who suggested water therapy and on land PT as well, did help, plans to consider increasing walking, does not think he need cane for now.  Has ongong ETOH use, no beer but maybe 1/2 bottle wine per day and some bourbon 2 drinks per day.  Still smoking, not ready to quit   Has also osteoporosis, taking vit D, but not sure if he wants prolia.  Due for flu shot Wt Readings from Last 3 Encounters:  06/05/21 145 lb (65.8 kg)  11/27/20 152 lb (68.9 kg)  09/10/20 150 lb (68 kg)   BP Readings from Last 3 Encounters:  06/05/21 122/70  11/27/20 118/70  09/10/20 125/74         Past Medical History:  Diagnosis Date   Allergic rhinitis    Allergy    seasonal allergies   GERD (gastroesophageal reflux disease)    on meds   Hyperlipidemia    on meds   Osteopenia 2021   Peripheral neuropathy    Sciatica of right side    recurrent   Sleep apnea    pt denies   Squamous cell carcinoma in situ (SCCIS) 06/15/2005   Outer Right Rim of Ear (Cx3,5FU)   Squamous cell carcinoma in situ (SCCIS) 02/01/2012   Left Sideburn (tx p bx)   Squamous cell carcinoma in situ (SCCIS) 04/05/2016   Left Forearm(Cx3,5FU), Left Sideburn Sup(Cx3,5FU), and Left Sideburn Inf (Cx3,5FU)   Squamous cell carcinoma in situ (SCCIS) 05/06/2016   Left Side Neck (tx p bx)   Squamous  cell skin cancer, earlobe    Warts, genital    Past Surgical History:  Procedure Laterality Date   COLONOSCOPY  08/12/2005   SA-MAC-(good)suprep-TA/tics   LUMBAR [place]C SURGERY  2004   LUMBAR [place] SURGERY  05/2018   for comression fx   MOUTH SURGERY  2019   implants   TONSILLECTOMY      reports that he has been smoking cigarettes. He has been smoking an average of .25 packs per day. He has never used smokeless tobacco. He reports current alcohol use of about 20.0 standard drinks per week. He reports that he does not use drugs. family history includes Bladder Cancer (age of onset: 41) in his father; Brain cancer (age of onset: 44) in his mother; Hernia in his brother; Hyperlipidemia in his father; Lung cancer (age of onset: 4) in his mother; Lymphoma (age of onset: 73) in his father. No Known Allergies Current Outpatient Medications on File Prior to Visit  Medication Sig Dispense Refill   aspirin 81 MG EC tablet Take 1 tablet (81 mg total) by mouth daily. 90 tablet 1   atorvastatin (LIPITOR) 80 MG tablet TAKE 1 TABLET DAILY 90 tablet 3   b complex vitamins tablet Take 1  tablet by mouth daily.     calcitonin, salmon, (MIACALCIN/FORTICAL) 200 UNIT/ACT nasal spray SPRAY 1 SPRAY INTO EACH NOSTRIL EVERY DAY 11.1 mL 1   Carboxymeth-Glycerin-Polysorb (REFRESH OPTIVE ADVANCED OP) Apply 1 drop to eye daily.     gabapentin (NEURONTIN) 300 MG capsule TAKE 2 CAPSULES 3 TIMES A  DAY 540 capsule 1   Glucosamine-Chondroitin (MOVE FREE PO) Take by mouth daily.     imiquimod (ALDARA) 5 % cream Apply To Affected Area Monday, Wed, Fri x 6-8 weeks 6 each 0   ketotifen (ZADITOR) 0.025 % ophthalmic solution Place 1 drop into both eyes 2 (two) times daily as needed.     Loratadine (CLARITIN PO) Take by mouth daily.     Melatonin 3 MG TABS Take 3 mg by mouth at bedtime.     Multiple Vitamin (MULTIVITAMIN) tablet Take 1 tablet by mouth daily.     naproxen (NAPROSYN) 250 MG tablet Take by mouth daily as needed.      omeprazole (PRILOSEC) 20 MG capsule Take 1 capsule (20 mg total) by mouth daily. (Patient taking differently: Take 20 mg by mouth every other day.) 90 capsule 3   Probiotic Product (PROBIOTIC PO) Take by mouth daily.     No current facility-administered medications on file prior to visit.        ROS:  All others reviewed and negative.  Objective        PE:  BP 122/70 (BP Location: Left Arm, Patient Position: Sitting, Cuff Size: Normal)   Pulse 76   Temp 97.8 F (36.6 C) (Oral)   Ht _0  (1.727 m)   Wt 145 lb (65.8 kg)   SpO2 98%   BMI 22.05 kg/m                 Constitutional: Pt appears in NAD               HENT: Head: NCAT.                Right Ear: External ear normal.                 Left Ear: External ear normal.                Eyes: . Pupils are equal, round, and reactive to light. Conjunctivae and EOM are normal               Nose: without d/c or deformity               Neck: Neck supple. Gross normal ROM               Cardiovascular: Normal rate and regular rhythm.                 Pulmonary/Chest: Effort normal and breath sounds without rales or wheezing.                Abd:  Soft, NT, ND, + BS, no organomegaly               Neurological: Pt is alert. At baseline orientation, motor grossly intact               Skin: Skin is warm. No rashes, no other new lesions, LE edema - none               Psychiatric: Pt behavior is normal without agitation   Micro: none  Cardiac tracings I have personally interpreted today:  none  Pertinent Radiological findings (summarize): none   Lab Results  Component Value Date   WBC 6.1 11/27/2020   HGB 14.2 11/27/2020   HCT 41.8 11/27/2020   PLT 219.0 11/27/2020   GLUCOSE 80 06/05/2021   CHOL 164 06/05/2021   TRIG 153.0 (H) 06/05/2021   HDL 85.50 06/05/2021   LDLDIRECT 42.0 11/27/2020   LDLCALC 48 06/05/2021   ALT 64 (H) 06/05/2021   AST 58 (H) 06/05/2021   NA 139 06/05/2021   K 4.1 06/05/2021   CL 104 06/05/2021    CREATININE 0.80 06/05/2021   BUN 13 06/05/2021   CO2 27 06/05/2021   TSH 1.44 11/27/2020   PSA 1.39 11/27/2020   HGBA1C 5.6 06/05/2021   Assessment/Plan:  Manuel Cruz is a 75 y.o. White or Caucasian [1] male with  has a past medical history of Allergic rhinitis, Allergy, GERD (gastroesophageal reflux disease), Hyperlipidemia, Osteopenia (2021), Peripheral neuropathy, Sciatica of right side, Sleep apnea, Squamous cell carcinoma in situ (SCCIS) (06/15/2005), Squamous cell carcinoma in situ (SCCIS) (02/01/2012), Squamous cell carcinoma in situ (SCCIS) (04/05/2016), Squamous cell carcinoma in situ (SCCIS) (05/06/2016), Squamous cell skin cancer, earlobe, and Warts, genital.  Smoker Pt counseled to quit, pt not ready  Habitual alcohol use Pt counseled to quit, pt not ready  Hypertriglyceridemia Moderate elevated, for low fat diet,  to f/u any worsening symptoms or concerns   Hyperglycemia Lab Results  Component Value Date   HGBA1C 5.6 06/05/2021   Stable, pt to continue current medical treatment  - diet   Fatty liver For low fat diet, wt control,  to f/u any worsening symptoms or concerns  Osteoporosis with pathological fracture fracture healed, cont vit d, consider prolia  Followup: Return in about 6 months (around 12/03/2021).  Cathlean Cower, MD 06/08/2021 9:28 PM [facility]

## 2021-06-05 NOTE — Patient Instructions (Signed)
You had the flu shot today  Please continue all other medications as before, and refills have been done if requested.  Please have the pharmacy call with any other refills you may need.  Please continue your efforts at being more active, low cholesterol diet, and weight control.  Please keep your appointments with your specialists as you may have planned  Please go to the LAB at the blood drawing area for the tests to be done  You will be contacted by phone if any changes need to be made immediately.  Otherwise, you will receive a letter about your results with an explanation, but please check with MyChart first.  Please remember to sign up for MyChart if you have not done so, as this will be important to you in the future with finding out test results, communicating by private email, and scheduling acute appointments online when needed.  Please make an Appointment to return in 6 months, or sooner if needed 

## 2021-06-08 ENCOUNTER — Encounter: Payer: Self-pay | Admitting: Internal Medicine

## 2021-06-08 NOTE — Assessment & Plan Note (Signed)
Pt counseled to quit, pt not ready 

## 2021-06-08 NOTE — Assessment & Plan Note (Signed)
Moderate elevated, for low fat diet,  to f/u any worsening symptoms or concerns

## 2021-06-08 NOTE — Assessment & Plan Note (Signed)
For low fat diet, wt control,  to f/u any worsening symptoms or concerns

## 2021-06-08 NOTE — Assessment & Plan Note (Signed)
Lab Results  Component Value Date   HGBA1C 5.6 06/05/2021   Stable, pt to continue current medical treatment  - diet

## 2021-06-08 NOTE — Assessment & Plan Note (Signed)
fracture healed, cont vit d, consider prolia

## 2021-06-25 ENCOUNTER — Other Ambulatory Visit: Payer: Self-pay | Admitting: Internal Medicine

## 2021-07-21 ENCOUNTER — Telehealth: Payer: Self-pay | Admitting: Internal Medicine

## 2021-07-21 NOTE — Telephone Encounter (Signed)
Left message for patient to call back to schedule Medicare Annual Wellness Visit   Last AWV  05/23/20  Please schedule at anytime with [facility] if patient calls the office back.    40 Minutes appointment   Any questions, please call me at [phone]

## 2021-08-08 DIAGNOSIS — Z23 Encounter for immunization: Secondary | ICD-10-CM | POA: Diagnosis not present

## 2021-08-12 DIAGNOSIS — Z20828 Contact with and (suspected) exposure to other viral communicable diseases: Secondary | ICD-10-CM | POA: Diagnosis not present

## 2021-09-02 DIAGNOSIS — H524 Presbyopia: Secondary | ICD-10-CM | POA: Diagnosis not present

## 2021-09-02 DIAGNOSIS — H2513 Age-related nuclear cataract, bilateral: Secondary | ICD-10-CM | POA: Diagnosis not present

## 2021-09-02 DIAGNOSIS — H353121 Nonexudative age-related macular degeneration, left eye, early dry stage: Secondary | ICD-10-CM | POA: Diagnosis not present

## 2021-09-02 DIAGNOSIS — H43821 Vitreomacular adhesion, right eye: Secondary | ICD-10-CM | POA: Diagnosis not present

## 2021-09-02 DIAGNOSIS — H04123 Dry eye syndrome of bilateral lacrimal glands: Secondary | ICD-10-CM | POA: Diagnosis not present

## 2021-09-11 ENCOUNTER — Telehealth: Payer: Self-pay | Admitting: Diagnostic Neuroimaging

## 2021-09-11 ENCOUNTER — Other Ambulatory Visit: Payer: Self-pay | Admitting: Internal Medicine

## 2021-09-11 NOTE — Telephone Encounter (Signed)
Manuel Cruz called for pt reporting that the pt had a fall about 6 weeks ago on the hardwood floor and is now having issues with his back again and is wanting to discuss with RN. Please advise.

## 2021-09-11 NOTE — Telephone Encounter (Signed)
Please refill as per office routine med refill policy (all routine meds to be refilled for 3 mo or monthly (per pt preference) up to one year from last visit, then month to month grace period for 3 mo, then further med refills will have to be denied) ? ?

## 2021-09-15 NOTE — Telephone Encounter (Signed)
Manuel Cruz, on [place] and advised she call PCP to have patient evaluated post fall. PCP can order xray or refer as needed. May need to see orthopedic. She verbalized understanding, appreciation.

## 2021-09-23 ENCOUNTER — Ambulatory Visit: Payer: Medicare Other | Admitting: Internal Medicine

## 2021-09-29 ENCOUNTER — Ambulatory Visit (INDEPENDENT_AMBULATORY_CARE_PROVIDER_SITE_OTHER): Payer: Medicare Other

## 2021-09-29 ENCOUNTER — Encounter: Payer: Self-pay | Admitting: Internal Medicine

## 2021-09-29 ENCOUNTER — Other Ambulatory Visit: Payer: Self-pay

## 2021-09-29 ENCOUNTER — Ambulatory Visit (INDEPENDENT_AMBULATORY_CARE_PROVIDER_SITE_OTHER): Payer: Medicare Other | Admitting: Internal Medicine

## 2021-09-29 VITALS — BP 120/78 | HR 84 | Ht 68.0 in | Wt 146.0 lb

## 2021-09-29 DIAGNOSIS — G8929 Other chronic pain: Secondary | ICD-10-CM

## 2021-09-29 DIAGNOSIS — M5489 Other dorsalgia: Secondary | ICD-10-CM

## 2021-09-29 DIAGNOSIS — M545 Low back pain, unspecified: Secondary | ICD-10-CM | POA: Diagnosis not present

## 2021-09-29 DIAGNOSIS — R739 Hyperglycemia, unspecified: Secondary | ICD-10-CM | POA: Diagnosis not present

## 2021-09-29 DIAGNOSIS — F17200 Nicotine dependence, unspecified, uncomplicated: Secondary | ICD-10-CM | POA: Diagnosis not present

## 2021-09-29 DIAGNOSIS — M5440 Lumbago with sciatica, unspecified side: Secondary | ICD-10-CM | POA: Diagnosis not present

## 2021-09-29 DIAGNOSIS — S22080A Wedge compression fracture of T11-T12 vertebra, initial encounter for closed fracture: Secondary | ICD-10-CM | POA: Diagnosis not present

## 2021-09-29 MED ORDER — MELOXICAM 15 MG PO TABS: 15.0000 mg | ORAL_TABLET | Freq: Every day | ORAL | 1 refills | Status: AC

## 2021-09-29 MED ORDER — CYCLOBENZAPRINE HCL 5 MG PO TABS: 5.0000 mg | ORAL_TABLET | Freq: Three times a day (TID) | ORAL | 2 refills | Status: DC | PRN

## 2021-09-29 MED ORDER — TRAMADOL HCL 50 MG PO TABS
50.0000 mg | ORAL_TABLET | Freq: Four times a day (QID) | ORAL | 0 refills | Status: DC | PRN
Start: 2021-09-29 — End: 2021-12-07

## 2021-09-29 NOTE — Progress Notes (Signed)
Patient ID: Manuel Cruz, male   DOB: [birth year], 76 y.o.   MRN: [id number]         Chief Complaint:: yearly exam and  Office Visit (Back pain from a fall) Here with wife       HPI:  Manuel Cruz is a 76 y.o. male here with c/o lbp x 2 mo, started after a slip and fall to left lower back but pain mostly located across the low back bilateral and midline including the middle back as well; pain was severe to start now somewhat improved to moderate, intermitttent, aching, without worsening LE pain, numbness or weakness and no further giveaways or other falls.  Any movement can seem to make worse.  Pt has hx of neuropathy since 2019 Lumbar compression fx and chronic LE aching no change since then.  Pt denies chest pain, increased sob or doe, wheezing, orthopnea, PND, increased LE swelling, palpitations, dizziness or syncope.   Pt denies polydipsia, polyuria, or new focal neuro s/s    Pt denies fever, wt loss, night sweats, loss of appetite, or other constitutional symptoms  Still smoking, not ready to quit Wt Readings from Last 3 Encounters:  09/29/21 146 lb (66.2 kg)  06/05/21 145 lb (65.8 kg)  11/27/20 152 lb (68.9 kg)   BP Readings from Last 3 Encounters:  09/29/21 120/78  06/05/21 122/70  11/27/20 118/70   Immunization History  Administered Date(s) Administered   Fluad Quad(high Dose 65+) 06/02/2020, 06/05/2021   H1N1 08/13/2008   Influenza Whole 06/13/2008   Influenza, High Dose Seasonal PF 06/08/2018, 04/30/2019, 04/30/2019   Influenza,inj,Quad PF,6+ Mos 06/11/2014   Influenza-Unspecified 04/25/2019   PFIZER(Purple Top)SARS-COV-2 Vaccination 10/08/2019, 10/29/2019   Pneumococcal Conjugate-13 03/15/2013, 04/10/2014   Pneumococcal Polysaccharide-23 04/17/2015   Td 10/24/2008   Tdap 11/08/2018   Zoster Recombinat (Shingrix) 06/02/2017, 11/27/2017   Zoster, Live 01/19/2010  There are no preventive care reminders to display for this patient.    Past Medical History:  Diagnosis Date    Allergic rhinitis    Allergy    seasonal allergies   GERD (gastroesophageal reflux disease)    on meds   Hyperlipidemia    on meds   Osteopenia 2021   Peripheral neuropathy    Sciatica of right side    recurrent   Sleep apnea    pt denies   Squamous cell carcinoma in situ (SCCIS) 06/15/2005   Outer Right Rim of Ear (Cx3,5FU)   Squamous cell carcinoma in situ (SCCIS) 02/01/2012   Left Sideburn (tx p bx)   Squamous cell carcinoma in situ (SCCIS) 04/05/2016   Left Forearm(Cx3,5FU), Left Sideburn Sup(Cx3,5FU), and Left Sideburn Inf (Cx3,5FU)   Squamous cell carcinoma in situ (SCCIS) 05/06/2016   Left Side Neck (tx p bx)   Squamous cell skin cancer, earlobe    Warts, genital    Past Surgical History:  Procedure Laterality Date   COLONOSCOPY  08/12/2005   SA-MAC-(good)suprep-TA/tics   LUMBAR DISC SURGERY  2004   LUMBAR [place] SURGERY  05/2018   for comression fx   MOUTH SURGERY  2019   implants   TONSILLECTOMY      reports that he has been smoking cigarettes. He has been smoking an average of .25 packs per day. He has never used smokeless tobacco. He reports current alcohol use of about 20.0 standard drinks per week. He reports that he does not use drugs. family history includes Bladder Cancer (age of onset: 39) in his father; Brain cancer (age of  onset: 10) in his mother; Hernia in his brother; Hyperlipidemia in his father; Lung cancer (age of onset: 24) in his mother; Lymphoma (age of onset: 18) in his father. No Known Allergies Current Outpatient Medications on File Prior to Visit  Medication Sig Dispense Refill   aspirin 81 MG EC tablet Take 1 tablet (81 mg total) by mouth daily. 90 tablet 1   atorvastatin (LIPITOR) 80 MG tablet TAKE 1 TABLET DAILY 90 tablet 3   b complex vitamins tablet Take 1 tablet by mouth daily.     calcitonin, salmon, (MIACALCIN/FORTICAL) 200 UNIT/ACT nasal spray SPRAY 1 SPRAY INTO EACH NOSTRIL EVERY DAY 3.7 mL 5   Carboxymeth-Glycerin-Polysorb  (REFRESH OPTIVE ADVANCED OP) Apply 1 drop to eye daily.     gabapentin (NEURONTIN) 300 MG capsule TAKE 2 CAPSULES 3 TIMES A  DAY 540 capsule 1   Glucosamine-Chondroitin (MOVE FREE PO) Take by mouth daily.     imiquimod (ALDARA) 5 % cream Apply To Affected Area Monday, Wed, Fri x 6-8 weeks 6 each 0   ketotifen (ZADITOR) 0.025 % ophthalmic solution Place 1 drop into both eyes 2 (two) times daily as needed.     Loratadine (CLARITIN PO) Take by mouth daily.     Melatonin 3 MG TABS Take 3 mg by mouth at bedtime.     Multiple Vitamin (MULTIVITAMIN) tablet Take 1 tablet by mouth daily.     naproxen (NAPROSYN) 250 MG tablet Take by mouth daily as needed.     omeprazole (PRILOSEC) 20 MG capsule Take 1 capsule (20 mg total) by mouth daily. (Patient taking differently: Take 20 mg by mouth every other day.) 90 capsule 3   Probiotic Product (PROBIOTIC PO) Take by mouth daily.     No current facility-administered medications on file prior to visit.        ROS:  All others reviewed and negative.  Objective        PE:  BP 120/78 (BP Location: Right Arm, Patient Position: Sitting, Cuff Size: Normal)    Pulse 84    Ht _0  (1.727 m)    Wt 146 lb (66.2 kg)    SpO2 100%    BMI 22.20 kg/m                 Constitutional: Pt appears in NAD               HENT: Head: NCAT.                Right Ear: External ear normal.                 Left Ear: External ear normal.                Eyes: . Pupils are equal, round, and reactive to light. Conjunctivae and EOM are normal               Nose: without d/c or deformity               Neck: Neck supple. Gross normal ROM               Cardiovascular: Normal rate and regular rhythm.                 Pulmonary/Chest: Effort normal and breath sounds without rales or wheezing.                Spine - tender in midline over lower thoracic vertebra o/w has mild bilateral  msk tension tender in the lumbar               Abd:  Soft, NT, ND, + BS, no organomegaly                Neurological: Pt is alert. At baseline orientation, motor grossly intact               Skin: Skin is warm. No rashes, no other new lesions, LE edema - none               Psychiatric: Pt behavior is normal without agitation   Micro: none  Cardiac tracings I have personally interpreted today:  none  Pertinent Radiological findings (summarize): none   Lab Results  Component Value Date   WBC 6.1 11/27/2020   HGB 14.2 11/27/2020   HCT 41.8 11/27/2020   PLT 219.0 11/27/2020   GLUCOSE 80 06/05/2021   CHOL 164 06/05/2021   TRIG 153.0 (H) 06/05/2021   HDL 85.50 06/05/2021   LDLDIRECT 42.0 11/27/2020   LDLCALC 48 06/05/2021   ALT 64 (H) 06/05/2021   AST 58 (H) 06/05/2021   NA 139 06/05/2021   K 4.1 06/05/2021   CL 104 06/05/2021   CREATININE 0.80 06/05/2021   BUN 13 06/05/2021   CO2 27 06/05/2021   TSH 1.44 11/27/2020   PSA 1.39 11/27/2020   HGBA1C 5.6 06/05/2021   Assessment/Plan:  BRIGHTEN ORNDOFF is a 76 y.o. White or Caucasian [1] male with  has a past medical history of Allergic rhinitis, Allergy, GERD (gastroesophageal reflux disease), Hyperlipidemia, Osteopenia (2021), Peripheral neuropathy, Sciatica of right side, Sleep apnea, Squamous cell carcinoma in situ (SCCIS) (06/15/2005), Squamous cell carcinoma in situ (SCCIS) (02/01/2012), Squamous cell carcinoma in situ (SCCIS) (04/05/2016), Squamous cell carcinoma in situ (SCCIS) (05/06/2016), Squamous cell skin cancer, earlobe, and Warts, genital.  Low back pain Pt c/o lower back pain most likely msk strain post fall, but cant r/o underlying fx - for ls spine and t spine films , mobic prn, tizanidine prn, tramadol prn breakthrough pain, consider PT, consider MRI and pt request change of NS if needed to be seen for kyphoplasty such as Dr Louanne Skye he has heard positive reviews.    Smoker Pt counseled to quit, pt not ready  Hyperglycemia Lab Results  Component Value Date   HGBA1C 5.6 06/05/2021   Stable, pt to continue current  medical treatment  - diet  Followup: Return if symptoms worsen or fail to improve.  Cathlean Cower, MD 10/04/2021 7:20 AM [facility]

## 2021-09-29 NOTE — Patient Instructions (Signed)
Please take all new medication as prescribed - the anti-inflammatory, muscle relaxer, and tramadol for breakthrough pain as needed  Please continue all other medications as before, and refills have been done if requested.  Please have the pharmacy call with any other refills you may need.  Please keep your appointments with your specialists as you may have planned  Please go to the XRAY Department in the first floor for the x-ray testing  You will be contacted by phone if any changes need to be made immediately.  Otherwise, you will receive a letter about your results with an explanation, but please check with MyChart first.  Please remember to sign up for MyChart if you have not done so, as this will be important to you in the future with finding out test results, communicating by private email, and scheduling acute appointments online when needed.  Please call in 1-2 wks , or see [place] at your next visit if not improved to consider MRI and then seeing Dr Louanne Skye

## 2021-09-30 ENCOUNTER — Encounter: Payer: Self-pay | Admitting: Internal Medicine

## 2021-09-30 ENCOUNTER — Other Ambulatory Visit: Payer: Self-pay | Admitting: Internal Medicine

## 2021-09-30 ENCOUNTER — Telehealth: Payer: Self-pay | Admitting: Internal Medicine

## 2021-09-30 DIAGNOSIS — S22080A Wedge compression fracture of T11-T12 vertebra, initial encounter for closed fracture: Secondary | ICD-10-CM

## 2021-09-30 NOTE — Telephone Encounter (Signed)
This is what I suspected from yesterday exam.  Ok to let pt know the tender spot in the mid thoracic area (which is above the lumbar area) we found yesterday was indeed a fracture; I will order the MRI for the Thoracic spine only, and refer to Dr Louanne Skye as we discussed

## 2021-09-30 NOTE — Telephone Encounter (Signed)
[place] with a call report on this patient - no one was available to take the call - so they had me check and make sure that the thoracic images taken yesterday were able to be seen in his chart.  [facility] wanted Dr. Jenny Reichmann to be aware of the compression noted in the Thoracic spine xray.  Left message with Lovena Le.

## 2021-09-30 NOTE — Telephone Encounter (Signed)
Dr. Jenny Reichmann out of office 09/30/21; will route to him for when he returns 10/01/21

## 2021-10-04 ENCOUNTER — Encounter: Payer: Self-pay | Admitting: Internal Medicine

## 2021-10-04 NOTE — Assessment & Plan Note (Signed)
Pt c/o lower back pain most likely msk strain post fall, but cant r/o underlying fx - for ls spine and t spine films , mobic prn, tizanidine prn, tramadol prn breakthrough pain, consider PT, consider MRI and pt request change of NS if needed to be seen for kyphoplasty such as Dr Louanne Skye he has heard positive reviews.

## 2021-10-04 NOTE — Assessment & Plan Note (Signed)
Lab Results  Component Value Date   HGBA1C 5.6 06/05/2021   Stable, pt to continue current medical treatment  - diet

## 2021-10-04 NOTE — Assessment & Plan Note (Signed)
Pt counseled to quit, pt not ready 

## 2021-10-05 ENCOUNTER — Telehealth: Payer: Self-pay | Admitting: Internal Medicine

## 2021-10-05 NOTE — Telephone Encounter (Signed)
Caller [name] states patient request Dr. Louanne Skye f@ [address] for Neurosurgery referral  Caller states patient received a call from [facility] to schedule appt  Please call [name] [phone]

## 2021-10-07 ENCOUNTER — Telehealth: Payer: Self-pay | Admitting: Internal Medicine

## 2021-10-07 ENCOUNTER — Inpatient Hospital Stay: Admission: RE | Admit: 2021-10-07 | Payer: Medicare Other | Source: Ambulatory Visit

## 2021-10-07 NOTE — Telephone Encounter (Signed)
I see where he has appt to have this done; if he wants to wait until after the 2 yr mark so insurance pays, that would be ok

## 2021-10-07 NOTE — Telephone Encounter (Signed)
[facility] has been notified as stated by provider

## 2021-10-07 NOTE — Telephone Encounter (Signed)
[facility] has called and states pt had last bone density 8.20.2021. Usually insurance only covers for at least two years. Can pt. Wait to have it done? Appt scheduled for today.   Please advise.    Callback [phone]

## 2021-10-08 ENCOUNTER — Other Ambulatory Visit: Payer: Self-pay

## 2021-10-08 ENCOUNTER — Ambulatory Visit
Admission: RE | Admit: 2021-10-08 | Discharge: 2021-10-08 | Disposition: A | Discharge status: Home / Self Care | Payer: Medicare Other | Source: Ambulatory Visit | Attending: Internal Medicine | Admitting: Internal Medicine

## 2021-10-08 DIAGNOSIS — M2578 Osteophyte, vertebrae: Secondary | ICD-10-CM | POA: Diagnosis not present

## 2021-10-08 DIAGNOSIS — S22080A Wedge compression fracture of T11-T12 vertebra, initial encounter for closed fracture: Secondary | ICD-10-CM

## 2021-11-05 ENCOUNTER — Other Ambulatory Visit: Payer: Self-pay

## 2021-11-05 ENCOUNTER — Ambulatory Visit: Payer: Self-pay

## 2021-11-05 ENCOUNTER — Ambulatory Visit (INDEPENDENT_AMBULATORY_CARE_PROVIDER_SITE_OTHER): Payer: Medicare Other | Admitting: Specialist

## 2021-11-05 ENCOUNTER — Encounter: Payer: Self-pay | Admitting: Specialist

## 2021-11-05 VITALS — BP 128/76 | HR 87 | Ht 68.0 in | Wt 146.0 lb

## 2021-11-05 DIAGNOSIS — S22080A Wedge compression fracture of T11-T12 vertebra, initial encounter for closed fracture: Secondary | ICD-10-CM

## 2021-11-05 DIAGNOSIS — E559 Vitamin D deficiency, unspecified: Secondary | ICD-10-CM | POA: Diagnosis not present

## 2021-11-05 DIAGNOSIS — M8588 Other specified disorders of bone density and structure, other site: Secondary | ICD-10-CM | POA: Diagnosis not present

## 2021-11-05 LAB — TESTOSTERONE TOTAL,FREE,BIO, MALES
Albumin: 4.6 g/dL (ref 3.6–5.1)
Sex Hormone Binding: 54 nmol/L (ref 22–77)
Testosterone, Bioavailable: 96 ng/dL (ref 15.0–150.0)
Testosterone, Free: 45.7 pg/mL (ref 6.0–73.0)
Testosterone: 522 ng/dL (ref 250–827)

## 2021-11-05 LAB — EXTRA LAV TOP TUBE

## 2021-11-05 LAB — VITAMIN D 25 HYDROXY (VIT D DEFICIENCY, FRACTURES): Vit D, 25-Hydroxy: 33 ng/mL (ref 30–100)

## 2021-11-05 MED ORDER — PREGABALIN 75 MG PO CAPS: 75.0000 mg | ORAL_CAPSULE | Freq: Two times a day (BID) | ORAL | 0 refills | Status: DC

## 2021-11-05 NOTE — Progress Notes (Signed)
Office Visit Note   Patient: Manuel Cruz           Date of Birth: December 21, 1945           MRN: [id number] Visit Date: 11/05/2021              Requested by: Biagio Borg, MD [address] [place],  [place] [address] PCP: Biagio Borg, MD   Assessment & Plan: Visit Diagnoses:  1. Closed wedge compression fracture of T11 vertebra, initial encounter ([place])   2. Osteopenia of spine   3. Vitamin D deficiency     Plan: Avoid frequent bending and stooping  No lifting greater than 10 lbs. May use ice or moist heat for pain. Testing for both testosterone and vitamin D deficiency  Giving time for this to heal, may take 6 months for healing. If pain is such that you wish to consider kyphoplasty or vertebroplasty then we can schedule then.    Exercise is important to improve your indurance and does allow people to function better inspite of back pain.    Follow-Up Instructions: No follow-ups on file.   Orders:  Orders Placed This Encounter  Procedures   XR Thoracic Spine 2 View   No orders of the defined types were placed in this encounter.     Procedures: No procedures performed   Clinical Data: No additional findings.   Subjective: Chief Complaint  Patient presents with   Middle Back - Pain    T11 Compression fx    76 year old male with history of previous lumbar surgery by Dr. Lorin Mercy 15 or so years ago. He had a fall in Nov at Thanksgiving week prior and he fell walking on a wooden floor with socks and slipped falling backwards landing on buttocks. He had pain and this is in the back and radiates to the upper buttocks bilaterally. Cough and sneeze is not painful. Until he fell he was not painful in the back. He also has pain into the left medial knee and it is painful to standa   Review of Systems  Constitutional: Negative.   HENT: Negative.    Eyes: Negative.   Respiratory: Negative.    Cardiovascular: Negative.   Gastrointestinal: Negative.   Endocrine:  Negative.   Genitourinary: Negative.   Musculoskeletal: Negative.   Skin: Negative.   Allergic/Immunologic: Negative.   Neurological: Negative.   Hematological: Negative.   Psychiatric/Behavioral: Negative.      Objective: Vital Signs: BP 128/76 (BP Location: Left Arm, Patient Position: Sitting)   Pulse 87   Ht _0  (1.727 m)   Wt 146 lb (66.2 kg)   BMI 22.20 kg/m   Physical Exam Constitutional:      Appearance: He is well-developed.  HENT:     Head: Normocephalic and atraumatic.  Eyes:     Pupils: Pupils are equal, round, and reactive to light.  Pulmonary:     Effort: Pulmonary effort is normal.     Breath sounds: Normal breath sounds.  Abdominal:     General: Bowel sounds are normal.     Palpations: Abdomen is soft.  Musculoskeletal:     Cervical back: Normal range of motion and neck supple.  Skin:    General: Skin is warm and dry.  Neurological:     Mental Status: He is alert and oriented to person, place, and time.  Psychiatric:        Behavior: Behavior normal.  Thought Content: Thought content normal.        Judgment: Judgment normal.    Back Exam   Tenderness  The patient is experiencing tenderness in the lumbar.  Range of Motion  Extension:  abnormal  Flexion:  abnormal  Lateral bend right:  abnormal  Lateral bend left:  abnormal  Rotation right:  abnormal  Rotation left:  abnormal   Muscle Strength  Right Quadriceps:  5/5  Left Quadriceps:  5/5  Right Hamstrings:  5/5  Left Hamstrings:  5/5   Reflexes  Patellar:  0/4 Achilles:  0/4     Specialty Comments:  No specialty comments available.  Imaging: No results found.   PMFS History: Patient Active Problem List   Diagnosis Date Noted   Fatty liver 06/05/2021   Hypertriglyceridemia 06/05/2021   Peripheral neuropathy 06/05/2021   Elevated LFTs 05/22/2020   Coronary artery calcification seen on CT scan 11/20/2019   Osteoporosis with pathological fracture 04/27/2018    Compression fracture of L3 lumbar vertebra, closed, initial encounter ([place]) 04/05/2018   Low back pain 03/25/2018   Hyperglycemia 04/26/2017   Depression 04/12/2014   Smoker 04/12/2014   Right sided sciatica 03/15/2013   Habitual alcohol use 03/15/2013   Preventative health care 03/10/2011   OBSTRUCTIVE SLEEP APNEA 06/04/2010   Hyperlipidemia 10/24/2007   Allergic rhinitis 10/24/2007   Past Medical History:  Diagnosis Date   Allergic rhinitis    Allergy    seasonal allergies   GERD (gastroesophageal reflux disease)    on meds   Hyperlipidemia    on meds   Osteopenia 2021   Peripheral neuropathy    Sciatica of right side    recurrent   Sleep apnea    pt denies   Squamous cell carcinoma in situ (SCCIS) 06/15/2005   Outer Right Rim of Ear (Cx3,5FU)   Squamous cell carcinoma in situ (SCCIS) 02/01/2012   Left Sideburn (tx p bx)   Squamous cell carcinoma in situ (SCCIS) 04/05/2016   Left Forearm(Cx3,5FU), Left Sideburn Sup(Cx3,5FU), and Left Sideburn Inf (Cx3,5FU)   Squamous cell carcinoma in situ (SCCIS) 05/06/2016   Left Side Neck (tx p bx)   Squamous cell skin cancer, earlobe    Warts, genital     Family History  Problem Relation Age of Onset   Hyperlipidemia Father    Lymphoma Father 45   Bladder Cancer Father 37   Hernia Brother    Lung cancer Mother 40   Brain cancer Mother 54   Colon polyps Neg Hx    Colon cancer Neg Hx    Esophageal cancer Neg Hx    Rectal cancer Neg Hx    Stomach cancer Neg Hx     Past Surgical History:  Procedure Laterality Date   COLONOSCOPY  08/12/2005   SA-MAC-(good)suprep-TA/tics   LUMBAR [place] SURGERY  2004   LUMBAR [place] SURGERY  05/2018   for comression fx   MOUTH SURGERY  2019   implants   TONSILLECTOMY     Social History   Occupational History   Occupation: insurance    Comment: retired  Tobacco Use   Smoking status: Every Day    Packs/day: 0.25    Types: Cigarettes   Smokeless tobacco: Never  Vaping Use   Vaping  Use: Never used  Substance and Sexual Activity   Alcohol use: Yes    Alcohol/week: 20.0 standard drinks    Types: 20 Glasses of wine per week   Drug use: No   Sexual activity: Yes

## 2021-11-05 NOTE — Patient Instructions (Addendum)
Plan: Avoid frequent bending and stooping  No lifting greater than 10 lbs. May use ice or moist heat for pain. Testing for both testosterone and vitamin D deficiency  Giving time for this to heal, may take 6 months for healing. If pain is such that you wish to consider kyphoplasty or vertebroplasty then we can schedule then. Stop gabapentin and start Lyrica 75 mg po BID

## 2021-11-06 DIAGNOSIS — Z20822 Contact with and (suspected) exposure to covid-19: Secondary | ICD-10-CM | POA: Diagnosis not present

## 2021-11-09 ENCOUNTER — Telehealth: Payer: Self-pay | Admitting: Specialist

## 2021-11-09 NOTE — Telephone Encounter (Signed)
Pt wife called and wants Nitka to know pt wouldn't take lyrica because of the side effects, he is taking gabapentin. Wife also would like nitka to know pt is also taking vitamin D everyday.

## 2021-12-07 ENCOUNTER — Encounter: Payer: Self-pay | Admitting: Specialist

## 2021-12-07 ENCOUNTER — Other Ambulatory Visit: Payer: Self-pay

## 2021-12-07 ENCOUNTER — Ambulatory Visit (INDEPENDENT_AMBULATORY_CARE_PROVIDER_SITE_OTHER): Payer: Medicare Other

## 2021-12-07 ENCOUNTER — Ambulatory Visit (INDEPENDENT_AMBULATORY_CARE_PROVIDER_SITE_OTHER): Payer: Medicare Other | Admitting: Specialist

## 2021-12-07 VITALS — BP 120/75 | HR 89 | Ht 68.0 in | Wt 146.0 lb

## 2021-12-07 DIAGNOSIS — S22080A Wedge compression fracture of T11-T12 vertebra, initial encounter for closed fracture: Secondary | ICD-10-CM | POA: Diagnosis not present

## 2021-12-07 DIAGNOSIS — M8588 Other specified disorders of bone density and structure, other site: Secondary | ICD-10-CM

## 2021-12-07 DIAGNOSIS — E559 Vitamin D deficiency, unspecified: Secondary | ICD-10-CM

## 2021-12-07 MED ORDER — CALCITONIN (SALMON) 200 UNIT/ACT NA SOLN: NASAL | 5 refills | Status: DC

## 2021-12-07 MED ORDER — VITAMIN D (ERGOCALCIFEROL) 1.25 MG (50000 UNIT) PO CAPS: 50000.0000 [IU] | ORAL_CAPSULE | ORAL | 0 refills | Status: DC

## 2021-12-07 MED ORDER — TRAMADOL HCL 50 MG PO TABS: 50.0000 mg | ORAL_TABLET | Freq: Four times a day (QID) | ORAL | 0 refills | Status: DC | PRN

## 2021-12-07 NOTE — Progress Notes (Signed)
? ?Office Visit Note ?  ?Patient: Manuel Cruz           ?Date of Birth: Jan 27, 1946           ?MRN: [id number] ?Visit Date: 12/07/2021 ?             ?Requested by: Biagio Borg, MD ?[place],  [place] [id number] ?PCP: Biagio Borg, MD ? ? ?Assessment & Plan: ?Visit Diagnoses:  ?1. Closed wedge compression fracture of T11 vertebra, initial encounter ([place])   ?2. Osteopenia of spine   ?3. Vitamin D deficiency   ? ? ?Plan: Avoid frequent bending and stooping  ?No lifting greater than 10 lbs. ?May use ice or moist heat for pain. ?Weight loss is of benefit. ?Best medication for lumbar disc disease is arthritis medications like motrin, celebrex and naprosyn. ?Exercise is important to improve your indurance and does allow people to function better inspite of back pain. ?Need to take a Vitamin D replacement 50,000 IU weekly one capsule for 5 weeks then start 4,000 units per day ,maintenance ?Ultram for pain. ?Follow-Up Instructions: No follow-ups on file.  ? ?Orders:  ?Orders Placed This Encounter  ?Procedures  ? XR Lumbar Spine 2-3 Views  ? ?No orders of the defined types were placed in this encounter. ? ? ? ? Procedures: ?No procedures performed ? ? ?Clinical Data: ?No additional findings. ? ? ?Subjective: ?Chief Complaint  ?Patient presents with  ? Middle Back - Follow-up, Fracture  ? ? ?HPI ? ?Review of Systems ? ? ?Objective: ?Vital Signs: BP 120/75 (BP Location: Left Arm, Patient Position: Sitting)   Pulse 89   Ht _0  (1.727 m)   Wt 146 lb (66.2 kg)   BMI 22.20 kg/m?  ? ?Physical Exam ? ?Ortho Exam ? ?Specialty Comments:  ?No specialty comments available. ? ?Imaging: ?No results found. ? ? ?PMFS History: ?Patient Active Problem List  ? Diagnosis Date Noted  ? Fatty liver 06/05/2021  ? Hypertriglyceridemia 06/05/2021  ? Peripheral neuropathy 06/05/2021  ? Elevated LFTs 05/22/2020  ? Coronary artery calcification seen on CT scan 11/20/2019  ? Osteoporosis with pathological fracture 04/27/2018  ?  Compression fracture of L3 lumbar vertebra, closed, initial encounter ([place]) 04/05/2018  ? Low back pain 03/25/2018  ? Hyperglycemia 04/26/2017  ? Depression 04/12/2014  ? Smoker 04/12/2014  ? Right sided sciatica 03/15/2013  ? Habitual alcohol use 03/15/2013  ? Preventative health care 03/10/2011  ? OBSTRUCTIVE SLEEP APNEA 06/04/2010  ? Hyperlipidemia 10/24/2007  ? Allergic rhinitis 10/24/2007  ? ?Past Medical History:  ?Diagnosis Date  ? Allergic rhinitis   ? Allergy   ? seasonal allergies  ? GERD (gastroesophageal reflux disease)   ? on meds  ? Hyperlipidemia   ? on meds  ? Osteopenia 2021  ? Peripheral neuropathy   ? Sciatica of right side   ? recurrent  ? Sleep apnea   ? pt denies  ? Squamous cell carcinoma in situ (SCCIS) 06/15/2005  ? Outer Right Rim of Ear (Cx3,5FU)  ? Squamous cell carcinoma in situ (SCCIS) 02/01/2012  ? Left Sideburn (tx p bx)  ? Squamous cell carcinoma in situ (SCCIS) 04/05/2016  ? Left Forearm(Cx3,5FU), Left Sideburn Sup(Cx3,5FU), and Left Sideburn Inf (Cx3,5FU)  ? Squamous cell carcinoma in situ (SCCIS) 05/06/2016  ? Left Side Neck (tx p bx)  ? Squamous cell skin cancer, earlobe   ? Warts, genital   ?  ?Family History  ?Problem Relation Age of Onset  ?  Hyperlipidemia Father   ? Lymphoma Father 47  ? Bladder Cancer Father 65  ? Hernia Brother   ? Lung cancer Mother 52  ? Brain cancer Mother 17  ? Colon polyps Neg Hx   ? Colon cancer Neg Hx   ? Esophageal cancer Neg Hx   ? Rectal cancer Neg Hx   ? Stomach cancer Neg Hx   ?  ?Past Surgical History:  ?Procedure Laterality Date  ? COLONOSCOPY  08/12/2005  ? SA-MAC-(good)suprep-TA/tics  ? [place] SURGERY  2004  ? LUMBAR DISC SURGERY  05/2018  ? for comression fx  ? MOUTH SURGERY  2019  ? implants  ? TONSILLECTOMY    ? ?Social History  ? ?Occupational History  ? 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 Smoking status: Every Day  ?  Packs/day: 0.25  ?  Types: Cigarettes  ? Smokeless tobacco: Never  ?Vaping Use  ? Vaping  Use: Never used  ?Substance and Sexual Activity  ? Alcohol use: Yes  ?  Alcohol/week: 20.0 standard drinks  ?  Types: 20 Glasses of wine per week  ? Drug use: No  ? Sexual activity: Yes  ? ? ? ? ? ? ?

## 2021-12-07 NOTE — Patient Instructions (Signed)
Avoid frequent bending and stooping  ?No lifting greater than 10 lbs. ?May use ice or moist heat for pain. ?Weight loss is of benefit. ?Best medication for lumbar disc disease is arthritis medications like motrin, celebrex and naprosyn. ?Exercise is important to improve your indurance and does allow people to function better inspite of back pain. ?Continue with use of calcitonin nasal spray. ?Tramadol for pain ?Gabapentin for neurogenic pain. ?

## 2021-12-09 ENCOUNTER — Ambulatory Visit (INDEPENDENT_AMBULATORY_CARE_PROVIDER_SITE_OTHER): Payer: Medicare Other | Admitting: Internal Medicine

## 2021-12-09 ENCOUNTER — Ambulatory Visit (INDEPENDENT_AMBULATORY_CARE_PROVIDER_SITE_OTHER): Payer: Medicare Other

## 2021-12-09 ENCOUNTER — Telehealth: Payer: Self-pay | Admitting: Internal Medicine

## 2021-12-09 ENCOUNTER — Encounter: Payer: Self-pay | Admitting: Internal Medicine

## 2021-12-09 VITALS — BP 122/72 | HR 75 | Temp 97.9°F | Ht 68.0 in | Wt 148.0 lb

## 2021-12-09 DIAGNOSIS — F1090 Alcohol use, unspecified, uncomplicated: Secondary | ICD-10-CM | POA: Diagnosis not present

## 2021-12-09 DIAGNOSIS — E559 Vitamin D deficiency, unspecified: Secondary | ICD-10-CM

## 2021-12-09 DIAGNOSIS — R739 Hyperglycemia, unspecified: Secondary | ICD-10-CM

## 2021-12-09 DIAGNOSIS — E785 Hyperlipidemia, unspecified: Secondary | ICD-10-CM

## 2021-12-09 DIAGNOSIS — F17200 Nicotine dependence, unspecified, uncomplicated: Secondary | ICD-10-CM

## 2021-12-09 DIAGNOSIS — E7800 Pure hypercholesterolemia, unspecified: Secondary | ICD-10-CM | POA: Diagnosis not present

## 2021-12-09 DIAGNOSIS — S32030A Wedge compression fracture of third lumbar vertebra, initial encounter for closed fracture: Secondary | ICD-10-CM | POA: Diagnosis not present

## 2021-12-09 DIAGNOSIS — E538 Deficiency of other specified B group vitamins: Secondary | ICD-10-CM | POA: Diagnosis not present

## 2021-12-09 DIAGNOSIS — Z0000 Encounter for general adult medical examination without abnormal findings: Secondary | ICD-10-CM | POA: Diagnosis not present

## 2021-12-09 DIAGNOSIS — N320 Bladder-neck obstruction: Secondary | ICD-10-CM | POA: Diagnosis not present

## 2021-12-09 LAB — VITAMIN D 25 HYDROXY (VIT D DEFICIENCY, FRACTURES): VITD: 50.64 ng/mL (ref 30.00–100.00)

## 2021-12-09 LAB — URINALYSIS, ROUTINE W REFLEX MICROSCOPIC
Bilirubin Urine: NEGATIVE
Hgb urine dipstick: NEGATIVE
Ketones, ur: NEGATIVE
Leukocytes,Ua: NEGATIVE
Nitrite: NEGATIVE
RBC / HPF: NONE SEEN (ref 0–?)
Specific Gravity, Urine: 1.02 (ref 1.000–1.030)
Total Protein, Urine: NEGATIVE
Urine Glucose: NEGATIVE
Urobilinogen, UA: 0.2 (ref 0.0–1.0)
WBC, UA: NONE SEEN (ref 0–?)
pH: 6 (ref 5.0–8.0)

## 2021-12-09 LAB — BASIC METABOLIC PANEL
BUN: 13 mg/dL (ref 6–23)
CO2: 29 mEq/L (ref 19–32)
Calcium: 10 mg/dL (ref 8.4–10.5)
Chloride: 103 mEq/L (ref 96–112)
Creatinine, Ser: 0.82 mg/dL (ref 0.40–1.50)
GFR: 86.62 mL/min (ref >60.00)
Glucose, Bld: 95 mg/dL (ref 70–99)
Potassium: 4.5 mEq/L (ref 3.5–5.1)
Sodium: 139 mEq/L (ref 135–145)

## 2021-12-09 LAB — CBC WITH DIFFERENTIAL/PLATELET
Basophils Absolute: 0.1 10*3/uL (ref 0.0–0.1)
Basophils Relative: 1.5 % (ref 0.0–3.0)
Eosinophils Absolute: 0.3 10*3/uL (ref 0.0–0.7)
Eosinophils Relative: 4 % (ref 0.0–5.0)
HCT: 41.8 % (ref 39.0–52.0)
Hemoglobin: 14.1 g/dL (ref 13.0–17.0)
Lymphocytes Relative: 38.3 % (ref 12.0–46.0)
Lymphs Abs: 2.9 10*3/uL (ref 0.7–4.0)
MCHC: 33.8 g/dL (ref 30.0–36.0)
MCV: 104.1 fl — ABNORMAL HIGH (ref 78.0–100.0)
Monocytes Absolute: 1.2 10*3/uL — ABNORMAL HIGH (ref 0.1–1.0)
Monocytes Relative: 15.1 % — ABNORMAL HIGH (ref 3.0–12.0)
Neutro Abs: 3.2 10*3/uL (ref 1.4–7.7)
Neutrophils Relative %: 41.1 % — ABNORMAL LOW (ref 43.0–77.0)
Platelets: 279 10*3/uL (ref 150.0–400.0)
RBC: 4.01 Mil/uL — ABNORMAL LOW (ref 4.22–5.81)
RDW: 13 % (ref 11.5–15.5)
WBC: 7.7 10*3/uL (ref 4.0–10.5)

## 2021-12-09 LAB — LIPID PANEL
Cholesterol: 167 mg/dL (ref 0–200)
HDL: 79.2 mg/dL (ref >39.00)
LDL Cholesterol: 61 mg/dL (ref 0–99)
NonHDL: 87.3
Total CHOL/HDL Ratio: 2
Triglycerides: 132 mg/dL (ref 0.0–149.0)
VLDL: 26.4 mg/dL (ref 0.0–40.0)

## 2021-12-09 LAB — HEPATIC FUNCTION PANEL
ALT: 48 U/L (ref 0–53)
AST: 42 U/L — ABNORMAL HIGH (ref 0–37)
Albumin: 4.3 g/dL (ref 3.5–5.2)
Alkaline Phosphatase: 81 U/L (ref 39–117)
Bilirubin, Direct: 0.1 mg/dL (ref 0.0–0.3)
Total Bilirubin: 0.6 mg/dL (ref 0.2–1.2)
Total Protein: 6.9 g/dL (ref 6.0–8.3)

## 2021-12-09 LAB — TSH: TSH: 1.87 u[IU]/mL (ref 0.35–5.50)

## 2021-12-09 LAB — PSA: PSA: 1.33 ng/mL (ref 0.10–4.00)

## 2021-12-09 LAB — VITAMIN B12: Vitamin B-12: 407 pg/mL (ref 211–911)

## 2021-12-09 LAB — HEMOGLOBIN A1C: Hgb A1c MFr Bld: 5.9 % (ref 4.6–6.5)

## 2021-12-09 NOTE — Patient Instructions (Addendum)
Please call if you change your mind about fosamax (alendronate) - once per week, or the Prolia injections twice per year ? ?Increase the Vitamin D3 to 2000 units per wk ? ?Please stop smoking, and drinking ETOH ? ?Please continue all other medications as before, and refills have been done if requested. ? ?Please have the pharmacy call with any other refills you may need. ? ?Please continue your efforts at being more active, low cholesterol diet, and weight control. ? ?You are otherwise up to date with prevention measures today. ? ?Please keep your appointments with your specialists as you may have planned ? ?Please go to the LAB at the blood drawing area for the tests to be done ? ?You will be contacted by phone if any changes need to be made immediately.  Otherwise, you will receive a letter about your results with an explanation, but please check with MyChart first. ? ?Please remember to sign up for MyChart if you have not done so, as this will be important to you in the future with finding out test results, communicating by private email, and scheduling acute appointments online when needed. ? ?Please make an Appointment to return in 6 months, or sooner if needed ? ? ?

## 2021-12-09 NOTE — Assessment & Plan Note (Signed)
Counseled to quit pt not ready - still smoking  cigs per day ?

## 2021-12-09 NOTE — Telephone Encounter (Signed)
Ok to stop the 50K after finished this as is this is not a long term rx ? ?Last vitamin D ?Lab Results  ?Component Value Date  ? VD25OH 50.64 12/09/2021  ? ?Ok to take Vit D3 2000 units per day after done with 50K ?

## 2021-12-09 NOTE — Progress Notes (Signed)
? ?Subjective:  ? Manuel Cruz is a 76 y.o. male who presents for Medicare Annual/Subsequent preventive examination. ? ?Review of Systems    ? ?Cardiac Risk Factors include: advanced age (>48mn, >>31women);dyslipidemia;male gender ? ?   ?Objective:  ?  ?Today's Vitals  ? 12/09/21 1000  ?BP: 122/72  ?Pulse: 75  ?Temp: 97.9 ?F (36.6 ?C)  ?SpO2: 99%  ?Weight: 148 lb (67.1 kg)  ?Height: _0  (1.727 m)  ?PainSc: 0-No pain  ? ?Body mass index is 22.5 kg/m?. ? ? ?  12/09/2021  ? 10:53 AM 05/23/2020  ?  1:41 PM 09/28/2019  ?  1:24 PM 05/09/2019  ?  3:49 PM 01/09/2018  ? 10:45 AM 01/04/2017  ? 10:48 AM 09/03/2015  ?  2:35 PM  ?Advanced Directives  ?Does Patient Have a Medical Advance Directive? _1  Yes Yes  ?Type of Advance Directive Living will;Healthcare Power of [place] will [place] will [place] will Living will  ?Does patient want to make changes to medical advance directive?  No - Patient declined       ?Copy of HBucodain Chart? Yes - validated most recent copy scanned in chart (See row information)   No - copy requested No - copy requested No - copy requested No - copy requested  ? ? ?Current Medications (verified) ?Outpatient Encounter Medications as of 12/09/2021  ?Medication Sig  ? aspirin 81 MG EC tablet Take 1 tablet (81 mg total) by mouth daily.  ? atorvastatin (LIPITOR) 80 MG tablet TAKE 1 TABLET DAILY  ? b complex vitamins tablet Take 1 tablet by mouth daily.  ? calcitonin, salmon, (MIACALCIN/FORTICAL) 200 UNIT/ACT nasal spray Spray one spray into one nostril and alternate nostril every day  ? Carboxymeth-Glycerin-Polysorb (REFRESH OPTIVE ADVANCED OP) Apply 1 drop to eye daily.  ? Cholecalciferol (VITAMIN D-3 PO) 2,000 Units.  ? cyclobenzaprine (FLEXERIL) 5 MG tablet Take 1 tablet (5 mg total) by mouth 3 (three) times daily as needed for muscle spasms.  ?  gabapentin (NEURONTIN) 300 MG capsule TAKE 2 CAPSULES 3 TIMES A  DAY  ? ketotifen (ZADITOR) 0.025 % ophthalmic solution Place 1 drop into both eyes 2 (two) times daily as needed.  ? Loratadine (CLARITIN PO) Take by mouth daily.  ? Melatonin 3 MG TABS Take 3 mg by mouth at bedtime.  ? meloxicam (MOBIC) 15 MG tablet Take 1 tablet (15 mg total) by mouth daily.  ? Multiple Minerals-Vitamins (CALCIUM CITRATE PLUS/MAGNESIUM PO)   ? Multiple Vitamin (MULTIVITAMIN) tablet Take 1 tablet by mouth daily.  ? naproxen (NAPROSYN) 250 MG tablet Take by mouth daily as needed.  ? traMADol (ULTRAM) 50 MG tablet Take 1 tablet (50 mg total) by mouth every 6 (six) hours as needed.  ? Vitamin D, Ergocalciferol, (DRISDOL) 1.25 MG (50000 UNIT) CAPS capsule Take 1 capsule (50,000 Units total) by mouth every 7 (seven) days.  ? ?No facility-administered encounter medications on file as of 12/09/2021.  ? ? ?Allergies (verified) ?Patient has no known allergies.  ? ?History: ?Past Medical History:  ?Diagnosis Date  ? Allergic rhinitis   ? Allergy   ? seasonal allergies  ? GERD (gastroesophageal reflux disease)   ? on meds  ? Hyperlipidemia   ? on meds  ? Osteopenia 2021  ? Peripheral neuropathy   ? Sciatica of right side   ? recurrent  ? Sleep apnea   ?  pt denies  ? Squamous cell carcinoma in situ (SCCIS) 06/15/2005  ? Outer Right Rim of Ear (Cx3,5FU)  ? Squamous cell carcinoma in situ (SCCIS) 02/01/2012  ? Left Sideburn (tx p bx)  ? Squamous cell carcinoma in situ (SCCIS) 04/05/2016  ? Left Forearm(Cx3,5FU), Left Sideburn Sup(Cx3,5FU), and Left Sideburn Inf (Cx3,5FU)  ? Squamous cell carcinoma in situ (SCCIS) 05/06/2016  ? Left Side Neck (tx p bx)  ? Squamous cell skin cancer, earlobe   ? Warts, genital   ? ?Past Surgical History:  ?Procedure Laterality Date  ? COLONOSCOPY  08/12/2005  ? SA-MAC-(good)suprep-TA/tics  ? [place] SURGERY  2004  ? LUMBAR DISC SURGERY  05/2018  ? for comression fx  ? MOUTH SURGERY  2019  ? implants  ?  TONSILLECTOMY    ? ?Family History  ?Problem Relation Age of Onset  ? Hyperlipidemia Father   ? Lymphoma Father 73  ? Bladder Cancer Father 49  ? Hernia Brother   ? Lung cancer Mother 27  ? Brain cancer Mother 25  ? Colon polyps Neg Hx   ? Colon cancer Neg Hx   ? Esophageal cancer Neg Hx   ? Rectal cancer Neg Hx   ? Stomach cancer Neg Hx   ? ?Social History  ? ?Socioeconomic History  ? Marital status: Single  ?  Spouse name: Not on file  ? Number of children: 0  ? Years of education: Not on file  ? Highest education level: Bachelor's degree (e.g., BA, AB, BS)  ?Occupational History  ? 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 Smoking status: Every Day  ?  Packs/day: 0.25  ?  Types: Cigarettes  ? Smokeless tobacco: Never  ?Vaping Use  ? Vaping Use: Never used  ?Substance and Sexual Activity  ? Alcohol use: Yes  ?  Alcohol/week: 20.0 standard drinks  ?  Types: 20 Glasses of wine per week  ? Drug use: No  ? Sexual activity: Yes  ?Other Topics Concern  ? Not on file  ?Social History Narrative  ? Lives with sig other  ? Caffeine- sodas 2 daily   ? ?Social Determinants of Health  ? ?Financial Resource Strain: Not on file  ?Food Insecurity: Not on file  ?Transportation Needs: No Transportation Needs  ? Lack of Transportation (Medical): No  ? Lack of Transportation (Non-Medical): No  ?Physical Activity: Inactive  ? Days of Exercise per Week: 0 days  ? Minutes of Exercise per Session: 0 min  ?Stress: Not on file  ?Social Connections: Socially Integrated  ? Frequency of Communication with Friends and Family: More than three times a week  ? Frequency of Social Gatherings with Friends and Family: More than three times a week  ? Attends Religious Services: 1 to 4 times per year  ? Active Member of Clubs or Organizations: No  ? Attends Archivist Meetings: 1 to 4 times per year  ? Marital Status: Living with partner  ? ? ?Tobacco Counseling ?Ready to quit: Not Answered ?Counseling given: Not  Answered ? ? ?Clinical Intake: ? ?Pre-visit preparation completed: Yes ? ?Pain : No/denies pain ?Pain Score: 0-No pain ? ?  ? ?BMI - recorded: 22.5 ?Nutritional Status: BMI of 19-24  Normal ?Nutritional Risks: None ?Diabetes: No ? ?How often do you need to have someone help you when you read instructions, pamphlets, or other written materials from your doctor or pharmacy?: 1 - Never ?What is the last grade level you  completed in school?: [place] ? ?Diabetic? no ? ?Interpreter Needed?: No ? ?Information entered by :: Lisette Abu, LPN ? ? ?Activities of Daily Living ? ?  12/09/2021  ? 10:49 AM  ?In your present state of health, do you have any difficulty performing the following activities:  ?Hearing? 0  ?Vision? 0  ?Difficulty concentrating or making decisions? 0  ?Walking or climbing stairs? 0  ?Dressing or bathing? 0  ?Doing errands, shopping? 0  ?Preparing Food and eating ? N  ?Using the Toilet? N  ?In the past six months, have you accidently leaked urine? N  ?Do you have problems with loss of bowel control? N  ?Managing your Medications? N  ?Managing your Finances? N  ?Housekeeping or managing your Housekeeping? N  ? ? ?Patient Care Team: ?Biagio Borg, MD as PCP - General ?Lavonna Monarch, MD as Consulting Physician (Dermatology) ?Calvert Cantor, MD as Attending Physician (Ophthalmology) ? ?Indicate any recent Medical Services you may have received from other than [facility] providers in the past year (date may be approximate). ? ?   ?Assessment:  ? This is a routine wellness examination for Manuel Cruz. ? ?Hearing/Vision screen ?Hearing Screening - Comments:: Patient denied any hearing difficulty.   ?No hearing aids. ? ?Vision Screening - Comments:: Patient does not wear any corrective lenses/contacts.   ?[facility] exam done by: [facility] ? ? ?Dietary issues and exercise activities discussed: ?Current Exercise Habits: The patient does not participate in regular exercise at present, Exercise  limited by: Other - see comments (compression fx in back due to fall in November 2022) ? ? Goals Addressed   ?None ?  ?Depression Screen ? ?  12/09/2021  ? 10:40 AM 12/09/2021  ?  8:56 AM 09/29/2021  ?  1:28 PM 06/05/2021  ?  8:

## 2021-12-09 NOTE — Telephone Encounter (Signed)
Pts spouse requesting a cb to inquire why pt was advised to take Cholecalciferol (VITAMIN D-3 PO when pt is already prescribed Vitamin D, Ergocalciferol, (DRISDOL) 1.25 MG (50000 UNIT) CAPS capsule ? ? ?

## 2021-12-09 NOTE — Patient Instructions (Signed)
Manuel Cruz , ?Thank you for taking time to come for your Medicare Wellness Visit. I appreciate your ongoing commitment to your health goals. Please review the following plan we discussed and let me know if I can assist you in the future.  ? ?Screening recommendations/referrals: ?Colonoscopy: 09/10/2020; due every 7 years ?Recommended yearly ophthalmology/optometry visit for glaucoma screening and checkup ?Recommended yearly dental visit for hygiene and checkup ? ?Vaccinations: ?Influenza vaccine: 06/05/2021 ?Pneumococcal vaccine: 04/10/2014, 04/17/2015 ?Tdap vaccine: 11/08/2018; due every 10 years ?Shingles vaccine: 06/02/2017, 11/27/2017   ?Covid-19: 10/08/2019, 10/29/2019, 12/30/2020, 08/08/2021 ? ?Advanced directives: Yes, documents on file. ? ?Conditions/risks identified: Yes ? ?Next appointment: Please schedule your next Medicare Wellness Visit with your Nurse Health Advisor in 1 year or 366 days by calling [phone]. ? ?Preventive Care 72 Years and Older, Male ?Preventive care refers to lifestyle choices and visits with your health care provider that can promote health and wellness. ?What does preventive care include? ?A yearly physical exam. This is also called an annual well check. ?Dental exams once or twice a year. ?Routine eye exams. Ask your health care provider how often you should have your eyes checked. ?Personal lifestyle choices, including: ?Daily care of your teeth and gums. ?Regular physical activity. ?Eating a healthy diet. ?Avoiding tobacco and drug use. ?Limiting alcohol use. ?Practicing safe sex. ?Taking low doses of aspirin every day. ?Taking vitamin and mineral supplements as recommended by your health care provider. ?What happens during an annual well check? ?The services and screenings done by your health care provider during your annual well check will depend on your age, overall health, lifestyle risk factors, and family history of disease. ?Counseling  ?Your health care provider may ask you  questions about your: ?Alcohol use. ?Tobacco use. ?Drug use. ?Emotional well-being. ?Home and relationship well-being. ?Sexual activity. ?Eating habits. ?History of falls. ?Memory and ability to understand (cognition). ?Work and work Statistician. ?Screening  ?You may have the following tests or measurements: ?Height, weight, and BMI. ?Blood pressure. ?Lipid and cholesterol levels. These may be checked every 5 years, or more frequently if you are over 31 years old. ?Skin check. ?Lung cancer screening. You may have this screening every year starting at age 66 if you have a 30-pack-year history of smoking and currently smoke or have quit within the past 15 years. ?Fecal occult blood test (FOBT) of the stool. You may have this test every year starting at age 52. ?Flexible sigmoidoscopy or colonoscopy. You may have a sigmoidoscopy every 5 years or a colonoscopy every 10 years starting at age 57. ?Prostate cancer screening. Recommendations will vary depending on your family history and other risks. ?Hepatitis C blood test. ?Hepatitis B blood test. ?Sexually transmitted disease (STD) testing. ?Diabetes screening. This is done by checking your blood sugar (glucose) after you have not eaten for a while (fasting). You may have this done every 1-3 years. ?Abdominal aortic aneurysm (AAA) screening. You may need this if you are a current or former smoker. ?Osteoporosis. You may be screened starting at age 24 if you are at high risk. ?Talk with your health care provider about your test results, treatment options, and if necessary, the need for more tests. ?Vaccines  ?Your health care provider may recommend certain vaccines, such as: ?Influenza vaccine. This is recommended every year. ?Tetanus, diphtheria, and acellular pertussis (Tdap, Td) vaccine. You may need a Td booster every 10 years. ?Zoster vaccine. You may need this after age 75. ?Pneumococcal 13-valent conjugate (PCV13) vaccine. One dose is recommended  after age  65. ?Pneumococcal polysaccharide (PPSV23) vaccine. One dose is recommended after age 50. ?Talk to your health care provider about which screenings and vaccines you need and how often you need them. ?This information is not intended to replace advice given to you by your health care provider. Make sure you discuss any questions you have with your health care provider. ?Document Released: 09/26/2015 Document Revised: 05/19/2016 Document Reviewed: 07/01/2015 ?Elsevier Interactive Patient Education ? 2017 [place]. ? ?Fall Prevention in the Home ?Falls can cause injuries. They can happen to people of all ages. There are many things you can do to make your home safe and to help prevent falls. ?What can I do on the outside of my home? ?Regularly fix the edges of walkways and driveways and fix any cracks. ?Remove anything that might make you trip as you walk through a door, such as a raised step or threshold. ?Trim any bushes or trees on the path to your home. ?Use bright outdoor lighting. ?Clear any walking paths of anything that might make someone trip, such as rocks or tools. ?Regularly check to see if handrails are loose or broken. Make sure that both sides of any steps have handrails. ?Any raised decks and porches should have guardrails on the edges. ?Have any leaves, snow, or ice cleared regularly. ?Use sand or salt on walking paths during winter. ?Clean up any spills in your garage right away. This includes oil or grease spills. ?What can I do in the bathroom? ?Use night lights. ?Install grab bars by the toilet and in the tub and shower. Do not use towel bars as grab bars. ?Use non-skid mats or decals in the tub or shower. ?If you need to sit down in the shower, use a plastic, non-slip stool. ?Keep the floor dry. Clean up any water that spills on the floor as soon as it happens. ?Remove soap buildup in the tub or shower regularly. ?Attach bath mats securely with double-sided non-slip rug tape. ?Do not have throw  rugs and other things on the floor that can make you trip. ?What can I do in the bedroom? ?Use night lights. ?Make sure that you have a light by your bed that is easy to reach. ?Do not use any sheets or blankets that are too big for your bed. They should not hang down onto the floor. ?Have a firm chair that has side arms. You can use this for support while you get dressed. ?Do not have throw rugs and other things on the floor that can make you trip. ?What can I do in the kitchen? ?Clean up any spills right away. ?Avoid walking on wet floors. ?Keep items that you use a lot in easy-to-reach places. ?If you need to reach something above you, use a strong step stool that has a grab bar. ?Keep electrical cords out of the way. ?Do not use floor polish or wax that makes floors slippery. If you must use wax, use non-skid floor wax. ?Do not have throw rugs and other things on the floor that can make you trip. ?What can I do with my stairs? ?Do not leave any items on the stairs. ?Make sure that there are handrails on both sides of the stairs and use them. Fix handrails that are broken or loose. Make sure that handrails are as long as the stairways. ?Check any carpeting to make sure that it is firmly attached to the stairs. Fix any carpet that is loose or worn. ?Avoid having throw  rugs at the top or bottom of the stairs. If you do have throw rugs, attach them to the floor with carpet tape. ?Make sure that you have a light switch at the top of the stairs and the bottom of the stairs. If you do not have them, ask someone to add them for you. ?What else can I do to help prevent falls? ?Wear shoes that: ?Do not have high heels. ?Have rubber bottoms. ?Are comfortable and fit you well. ?Are closed at the toe. Do not wear sandals. ?If you use a stepladder: ?Make sure that it is fully opened. Do not climb a closed stepladder. ?Make sure that both sides of the stepladder are locked into place. ?Ask someone to hold it for you, if  possible. ?Clearly mark and make sure that you can see: ?Any grab bars or handrails. ?First and last steps. ?Where the edge of each step is. ?Use tools that help you move around (mobility aids) if they are needed. T

## 2021-12-09 NOTE — Progress Notes (Addendum)
Patient ID: Manuel Cruz, male   DOB: April 08, 1946, 76 y.o.   MRN: [id number]         Chief Complaint:: yearly exam       HPI:  Manuel Cruz is a 76 y.o. male here overall doing ok, Pt denies chest pain, increased sob or doe, wheezing, orthopnea, PND, increased LE swelling, palpitations, dizziness or syncope.   Pt denies polydipsia, polyuria, or new focal neuro s/s.    Pt denies fever, wt loss, night sweats, loss of appetite, or other constitutional symptoms   Did see Dr Otelia Sergeant NS with compression fracture, considering kyphoplasty.  Taking lower dose vit d.  Pt still smoking, not ready to quit.  Still drinking on daily basis.     Wt Readings from Last 3 Encounters:  12/09/21 148 lb (67.1 kg)  12/09/21 148 lb (67.1 kg)  12/07/21 146 lb (66.2 kg)   BP Readings from Last 3 Encounters:  12/09/21 122/72  12/09/21 122/72  12/07/21 120/75   Immunization History  Administered Date(s) Administered   Fluad Quad(high Dose 65+) 06/02/2020, 06/05/2021   H1N1 08/13/2008   Influenza Whole 06/13/2008   Influenza, High Dose Seasonal PF 06/08/2018, 04/30/2019, 04/30/2019   Influenza,inj,Quad PF,6+ Mos 06/11/2014   Influenza-Unspecified 04/25/2019   PFIZER(Purple Top)SARS-COV-2 Vaccination 10/08/2019, 10/29/2019   Pneumococcal Conjugate-13 03/15/2013, 04/10/2014   Pneumococcal Polysaccharide-23 04/17/2015   Td 10/24/2008   Tdap 11/08/2018   Zoster Recombinat (Shingrix) 06/02/2017, 11/27/2017   Zoster, Live 01/19/2010  There are no preventive care reminders to display for this patient.    Past Medical History:  Diagnosis Date   Allergic rhinitis    Allergy    seasonal allergies   GERD (gastroesophageal reflux disease)    on meds   Hyperlipidemia    on meds   Osteopenia 2021   Peripheral neuropathy    Sciatica of right side    recurrent   Sleep apnea    pt denies   Squamous cell carcinoma in situ (SCCIS) 06/15/2005   Outer Right Rim of Ear (Cx3,5FU)   Squamous cell carcinoma in situ  (SCCIS) 02/01/2012   Left Sideburn (tx p bx)   Squamous cell carcinoma in situ (SCCIS) 04/05/2016   Left Forearm(Cx3,5FU), Left Sideburn Sup(Cx3,5FU), and Left Sideburn Inf (Cx3,5FU)   Squamous cell carcinoma in situ (SCCIS) 05/06/2016   Left Side Neck (tx p bx)   Squamous cell skin cancer, earlobe    Warts, genital    Past Surgical History:  Procedure Laterality Date   COLONOSCOPY  08/12/2005   SA-MAC-(good)suprep-TA/tics   LUMBAR DISC SURGERY  2004   LUMBAR DISC SURGERY  05/2018   for comression fx   MOUTH SURGERY  2019   implants   TONSILLECTOMY      reports that he has been smoking cigarettes. He has been smoking an average of .25 packs per day. He has never used smokeless tobacco. He reports current alcohol use of about 20.0 standard drinks per week. He reports that he does not use drugs. family history includes Bladder Cancer (age of onset: 43) in his father; Brain cancer (age of onset: 3) in his mother; Hernia in his brother; Hyperlipidemia in his father; Lung cancer (age of onset: 34) in his mother; Lymphoma (age of onset: 38) in his father. No Known Allergies Current Outpatient Medications on File Prior to Visit  Medication Sig Dispense Refill   aspirin 81 MG EC tablet Take 1 tablet (81 mg total) by mouth daily. 90 tablet 1  atorvastatin (LIPITOR) 80 MG tablet TAKE 1 TABLET DAILY 90 tablet 3   b complex vitamins tablet Take 1 tablet by mouth daily.     calcitonin, salmon, (MIACALCIN/FORTICAL) 200 UNIT/ACT nasal spray Spray one spray into one nostril and alternate nostril every day 3.7 mL 5   Carboxymeth-Glycerin-Polysorb (REFRESH OPTIVE ADVANCED OP) Apply 1 drop to eye daily.     Cholecalciferol (VITAMIN D-3 PO) 2,000 Units.     cyclobenzaprine (FLEXERIL) 5 MG tablet Take 1 tablet (5 mg total) by mouth 3 (three) times daily as needed for muscle spasms. 40 tablet 2   gabapentin (NEURONTIN) 300 MG capsule TAKE 2 CAPSULES 3 TIMES A  DAY 540 capsule 1   ketotifen (ZADITOR)  0.025 % ophthalmic solution Place 1 drop into both eyes 2 (two) times daily as needed.     Loratadine (CLARITIN PO) Take by mouth daily.     Melatonin 3 MG TABS Take 3 mg by mouth at bedtime.     meloxicam (MOBIC) 15 MG tablet Take 1 tablet (15 mg total) by mouth daily. 90 tablet 1   Multiple Minerals-Vitamins (CALCIUM CITRATE PLUS/MAGNESIUM PO)      Multiple Vitamin (MULTIVITAMIN) tablet Take 1 tablet by mouth daily.     naproxen (NAPROSYN) 250 MG tablet Take by mouth daily as needed.     traMADol (ULTRAM) 50 MG tablet Take 1 tablet (50 mg total) by mouth every 6 (six) hours as needed. 30 tablet 0   Vitamin D, Ergocalciferol, (DRISDOL) 1.25 MG (50000 UNIT) CAPS capsule Take 1 capsule (50,000 Units total) by mouth every 7 (seven) days. 5 capsule 0   No current facility-administered medications on file prior to visit.        ROS:  All others reviewed and negative.  Objective        PE:  BP 122/72 (BP Location: Left Arm, Patient Position: Sitting, Cuff Size: Large)   Pulse 75   Temp 97.9 F (36.6 C) (Oral)   Ht 5\' 8"  (1.727 m)   Wt 148 lb (67.1 kg)   SpO2 99%   BMI 22.50 kg/m                 Constitutional: Pt appears in NAD but somewhat disheveled               HENT: Head: NCAT.                Right Ear: External ear normal.                 Left Ear: External ear normal.                Eyes: . Pupils are equal, round, and reactive to light. Conjunctivae and EOM are normal               Nose: without d/c or deformity               Neck: Neck supple. Gross normal ROM               Cardiovascular: Normal rate and regular rhythm.                 Pulmonary/Chest: Effort normal and breath sounds without rales or wheezing.                Abd:  Soft, NT, ND, + BS, no organomegaly               Neurological: Pt is alert.  At baseline orientation, motor grossly intact               Skin: Skin is warm. No rashes, no other new lesions, LE edema - none               Psychiatric: Pt behavior is  normal without agitation   Micro: none  Cardiac tracings I have personally interpreted today:  none  Pertinent Radiological findings (summarize): none   Lab Results  Component Value Date   WBC 7.7 12/09/2021   HGB 14.1 12/09/2021   HCT 41.8 12/09/2021   PLT 279.0 12/09/2021   GLUCOSE 95 12/09/2021   CHOL 167 12/09/2021   TRIG 132.0 12/09/2021   HDL 79.20 12/09/2021   LDLDIRECT 42.0 11/27/2020   LDLCALC 61 12/09/2021   ALT 48 12/09/2021   AST 42 (H) 12/09/2021   NA 139 12/09/2021   K 4.5 12/09/2021   CL 103 12/09/2021   CREATININE 0.82 12/09/2021   BUN 13 12/09/2021   CO2 29 12/09/2021   TSH 1.87 12/09/2021   PSA 1.33 12/09/2021   HGBA1C 5.9 12/09/2021   Assessment/Plan:  Manuel Cruz is a 76 y.o. White or Caucasian [1] male with  has a past medical history of Allergic rhinitis, Allergy, GERD (gastroesophageal reflux disease), Hyperlipidemia, Osteopenia (2021), Peripheral neuropathy, Sciatica of right side, Sleep apnea, Squamous cell carcinoma in situ (SCCIS) (06/15/2005), Squamous cell carcinoma in situ (SCCIS) (02/01/2012), Squamous cell carcinoma in situ (SCCIS) (04/05/2016), Squamous cell carcinoma in situ (SCCIS) (05/06/2016), Squamous cell skin cancer, earlobe, and Warts, genital.  Vitamin D deficiency Last vitamin D Lab Results  Component Value Date   VD25OH 33 11/05/2021   Low, to start oral replacement   Compression fracture of L3 lumbar vertebra, closed, initial encounter (HCC) Followed per Dr Otelia Sergeant NS recently after hard fall in the setting of osteopenia per DXA aug 2021; declines f/u dxa or fosamax or prolia for now  Smoker Counseled to quit pt not ready - still smoking  cigs per day  Hyperlipidemia Lab Results  Component Value Date   LDLCALC 61 12/09/2021   Stable, pt to continue current statin lipitor 80   Hyperglycemia Lab Results  Component Value Date   HGBA1C 5.9 12/09/2021   Stable, pt to continue current medical treatment  -  diet   Habitual alcohol use Pt counseled to quit, pt not ready  Followup: Return in about 6 months (around 06/11/2022).  Oliver Barre, MD 12/13/2021 5:18 PM [facility]

## 2021-12-09 NOTE — Assessment & Plan Note (Signed)
Followed per Dr Louanne Skye NS recently after hard fall in the setting of osteopenia per DXA aug 2021; declines f/u dxa or fosamax or prolia for now ?

## 2021-12-09 NOTE — Assessment & Plan Note (Signed)
Last vitamin D ?Lab Results  ?Component Value Date  ? VD25OH 33 11/05/2021  ? ?Low, to start oral replacement ? ?

## 2021-12-11 ENCOUNTER — Encounter: Payer: Self-pay | Admitting: Internal Medicine

## 2021-12-11 NOTE — Telephone Encounter (Signed)
Patients wife notified

## 2021-12-13 ENCOUNTER — Encounter: Payer: Self-pay | Admitting: Internal Medicine

## 2021-12-13 NOTE — Assessment & Plan Note (Signed)
Lab Results  ?Component Value Date  ? HGBA1C 5.9 12/09/2021  ? ?Stable, pt to continue current medical treatment  - diet ? ?

## 2021-12-13 NOTE — Assessment & Plan Note (Signed)
Pt counseled to quit, pt not ready 

## 2021-12-13 NOTE — Assessment & Plan Note (Signed)
Lab Results  ?Component Value Date  ? [place] 61 12/09/2021  ? ?Stable, pt to continue current statin lipitor 80 ? ?

## 2021-12-28 DIAGNOSIS — Z20822 Contact with and (suspected) exposure to covid-19: Secondary | ICD-10-CM | POA: Diagnosis not present

## 2022-02-10 ENCOUNTER — Ambulatory Visit (INDEPENDENT_AMBULATORY_CARE_PROVIDER_SITE_OTHER): Payer: Medicare Other | Admitting: Specialist

## 2022-02-10 ENCOUNTER — Encounter: Payer: Self-pay | Admitting: Specialist

## 2022-02-10 ENCOUNTER — Ambulatory Visit: Payer: Self-pay

## 2022-02-10 VITALS — BP 125/78 | HR 79 | Ht 68.0 in | Wt 148.0 lb

## 2022-02-10 DIAGNOSIS — S32030A Wedge compression fracture of third lumbar vertebra, initial encounter for closed fracture: Secondary | ICD-10-CM

## 2022-02-10 DIAGNOSIS — M8000XD Age-related osteoporosis with current pathological fracture, unspecified site, subsequent encounter for fracture with routine healing: Secondary | ICD-10-CM

## 2022-02-10 NOTE — Progress Notes (Signed)
Office Visit Note   Patient: Manuel Cruz           Date of Birth: 07/25/1946           MRN: [id number] Visit Date: 02/10/2022              Requested by: Biagio Borg, MD [address] [place],  [place] [address] PCP: Biagio Borg, MD   Assessment & Plan: Visit Diagnoses:  1. Compression fracture of L3 lumbar vertebra, closed, initial encounter ([place])   2. Osteoporosis with pathological fracture with routine healing, subsequent encounter     Plan: Avoid frequent bending and stooping  No lifting greater than 10 lbs. May use ice or moist heat for pain. Weight loss is of benefit. Best medication for lumbar disc disease is arthritis medications like motrin, celebrex and naprosyn. Exercise is important to improve your indurance and does allow people to function better inspite of back pain.  Follow-Up Instructions: No follow-ups on file.   Orders:  Orders Placed This Encounter  Procedures   XR Lumbar Spine 2-3 Views   No orders of the defined types were placed in this encounter.     Procedures: No procedures performed   Clinical Data: No additional findings.   Subjective: Chief Complaint  Patient presents with   Middle Back - Follow-up    76 year old male with history    Review of Systems  Constitutional: Negative.   HENT: Negative.    Eyes: Negative.   Respiratory: Negative.    Cardiovascular: Negative.   Gastrointestinal: Negative.   Endocrine: Negative.   Genitourinary: Negative.   Musculoskeletal: Negative.   Skin: Negative.   Allergic/Immunologic: Negative.   Neurological: Negative.   Hematological: Negative.   Psychiatric/Behavioral: Negative.      Objective: Vital Signs: BP 125/78 (BP Location: Left Arm, Patient Position: Sitting)   Pulse 79   Ht _0  (1.727 m)   Wt 148 lb (67.1 kg)   BMI 22.50 kg/m   Physical Exam Constitutional:      Appearance: He is well-developed.  HENT:     Head: Normocephalic and atraumatic.  Eyes:      Pupils: Pupils are equal, round, and reactive to light.  Pulmonary:     Effort: Pulmonary effort is normal.     Breath sounds: Normal breath sounds.  Abdominal:     General: Bowel sounds are normal.     Palpations: Abdomen is soft.  Musculoskeletal:     Cervical back: Normal range of motion and neck supple.     Lumbar back: Negative right straight leg raise test and negative left straight leg raise test.  Skin:    General: Skin is warm and dry.  Neurological:     Mental Status: He is alert and oriented to person, place, and time.  Psychiatric:        Behavior: Behavior normal.        Thought Content: Thought content normal.        Judgment: Judgment normal.    Back Exam   Tenderness  The patient is experiencing tenderness in the lumbar.  Range of Motion  Extension:  abnormal  Flexion:  abnormal  Lateral bend right:  abnormal  Lateral bend left:  abnormal  Rotation right:  abnormal  Rotation left:  abnormal   Muscle Strength  Right Quadriceps:  5/5  Left Quadriceps:  5/5  Right Hamstrings:  5/5  Left Hamstrings:  5/5   Tests  Straight leg  raise right: negative Straight leg raise left: negative  Reflexes  Patellar:  2/4  Other  Toe walk: normal Heel walk: normal Erythema: no back redness Scars: absent     Specialty Comments:  No specialty comments available.  Imaging: No results found.   PMFS History: Patient Active Problem List   Diagnosis Date Noted   Vitamin D deficiency 12/09/2021   Fatty liver 06/05/2021   Hypertriglyceridemia 06/05/2021   Peripheral neuropathy 06/05/2021   Elevated LFTs 05/22/2020   Coronary artery calcification seen on CT scan 11/20/2019   Osteoporosis with pathological fracture 04/27/2018   Compression fracture of L3 lumbar vertebra, closed, initial encounter ([place]) 04/05/2018   Low back pain 03/25/2018   Hyperglycemia 04/26/2017   Depression 04/12/2014   Smoker 04/12/2014   Right sided sciatica 03/15/2013   Habitual  alcohol use 03/15/2013   Preventative health care 03/10/2011   OBSTRUCTIVE SLEEP APNEA 06/04/2010   Hyperlipidemia 10/24/2007   Allergic rhinitis 10/24/2007   Past Medical History:  Diagnosis Date   Allergic rhinitis    Allergy    seasonal allergies   GERD (gastroesophageal reflux disease)    on meds   Hyperlipidemia    on meds   Osteopenia 2021   Peripheral neuropathy    Sciatica of right side    recurrent   Sleep apnea    pt denies   Squamous cell carcinoma in situ (SCCIS) 06/15/2005   Outer Right Rim of Ear (Cx3,5FU)   Squamous cell carcinoma in situ (SCCIS) 02/01/2012   Left Sideburn (tx p bx)   Squamous cell carcinoma in situ (SCCIS) 04/05/2016   Left Forearm(Cx3,5FU), Left Sideburn Sup(Cx3,5FU), and Left Sideburn Inf (Cx3,5FU)   Squamous cell carcinoma in situ (SCCIS) 05/06/2016   Left Side Neck (tx p bx)   Squamous cell skin cancer, earlobe    Warts, genital     Family History  Problem Relation Age of Onset   Hyperlipidemia Father    Lymphoma Father 49   Bladder Cancer Father 61   Hernia Brother    Lung cancer Mother 58   Brain cancer Mother 57   Colon polyps Neg Hx    Colon cancer Neg Hx    Esophageal cancer Neg Hx    Rectal cancer Neg Hx    Stomach cancer Neg Hx     Past Surgical History:  Procedure Laterality Date   COLONOSCOPY  08/12/2005   SA-MAC-(good)suprep-TA/tics   LUMBAR [place] SURGERY  2004   LUMBAR [place] SURGERY  05/2018   for comression fx   MOUTH SURGERY  2019   implants   TONSILLECTOMY     Social History   Occupational History   Occupation: insurance    Comment: retired  Tobacco Use   Smoking status: Every Day    Packs/day: 0.25    Types: Cigarettes   Smokeless tobacco: Never  Vaping Use   Vaping Use: Never used  Substance and Sexual Activity   Alcohol use: Yes    Alcohol/week: 20.0 standard drinks    Types: 20 Glasses of wine per week   Drug use: No   Sexual activity: Yes

## 2022-02-10 NOTE — Patient Instructions (Signed)
Avoid frequent bending and stooping  No lifting greater than 10 lbs. May use ice or moist heat for pain. Weight loss is of benefit. Best medication for lumbar disc disease is arthritis medications like motrin, celebrex and naprosyn. Exercise is important to improve your indurance and does allow people to function better inspite of back pain.   

## 2022-03-07 DIAGNOSIS — Z23 Encounter for immunization: Secondary | ICD-10-CM | POA: Diagnosis not present

## 2022-03-12 ENCOUNTER — Ambulatory Visit: Payer: Medicare Other | Admitting: Specialist

## 2022-03-16 IMAGING — DX DG THORACIC SPINE 2V
3 series · 3 of 3 positions shown · non-contrast
Comparison: Chest x-ray 12/11/2018.

CLINICAL DATA: Fall.  Two months of pain.

EXAM:
THORACIC SPINE 2 VIEWS

[t-spine ap]
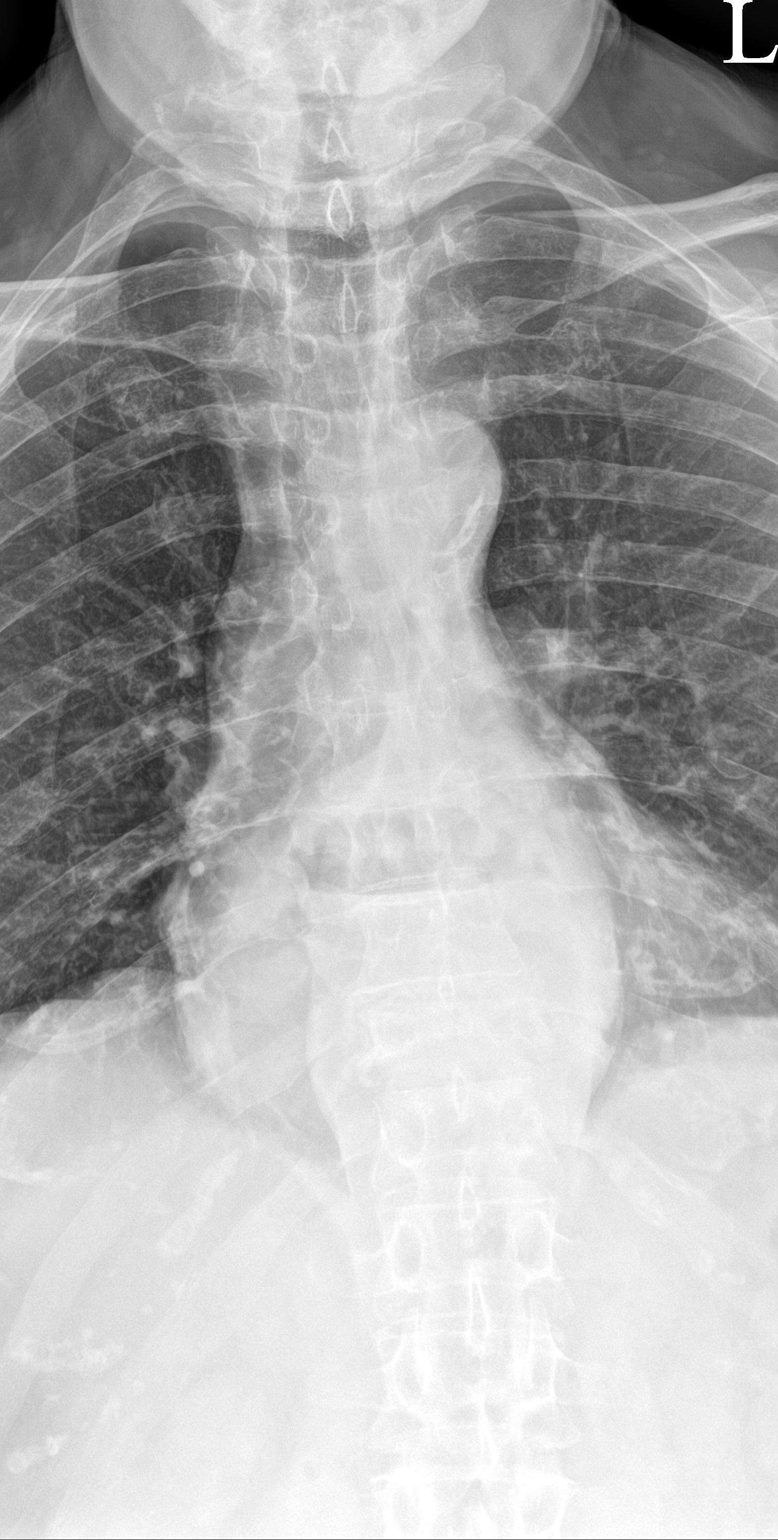

[t-spine lat]
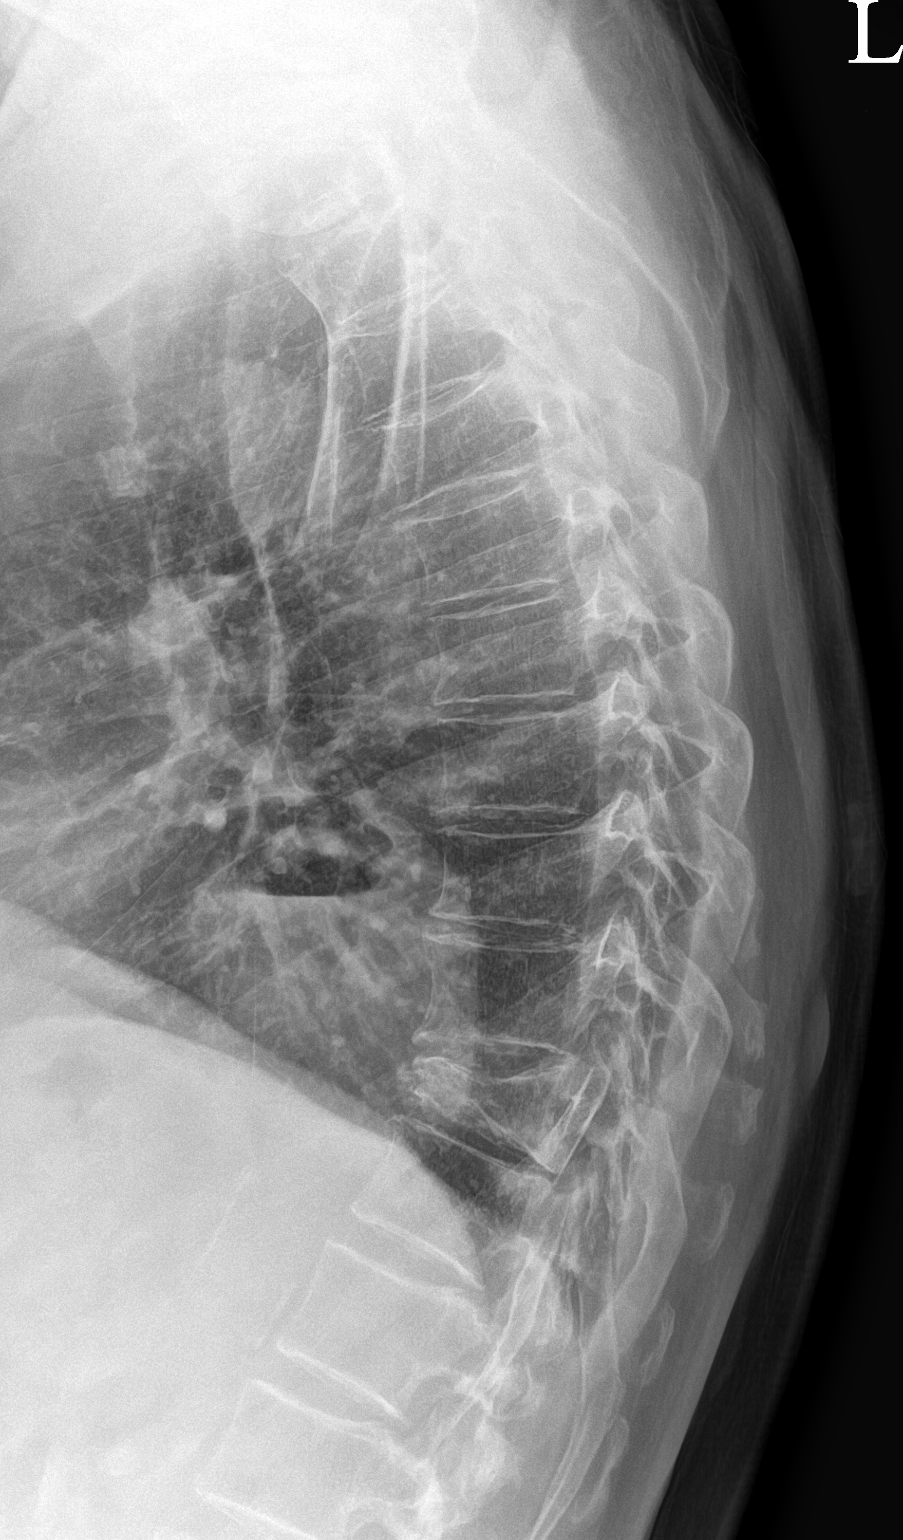

[ct-spine swimmers]
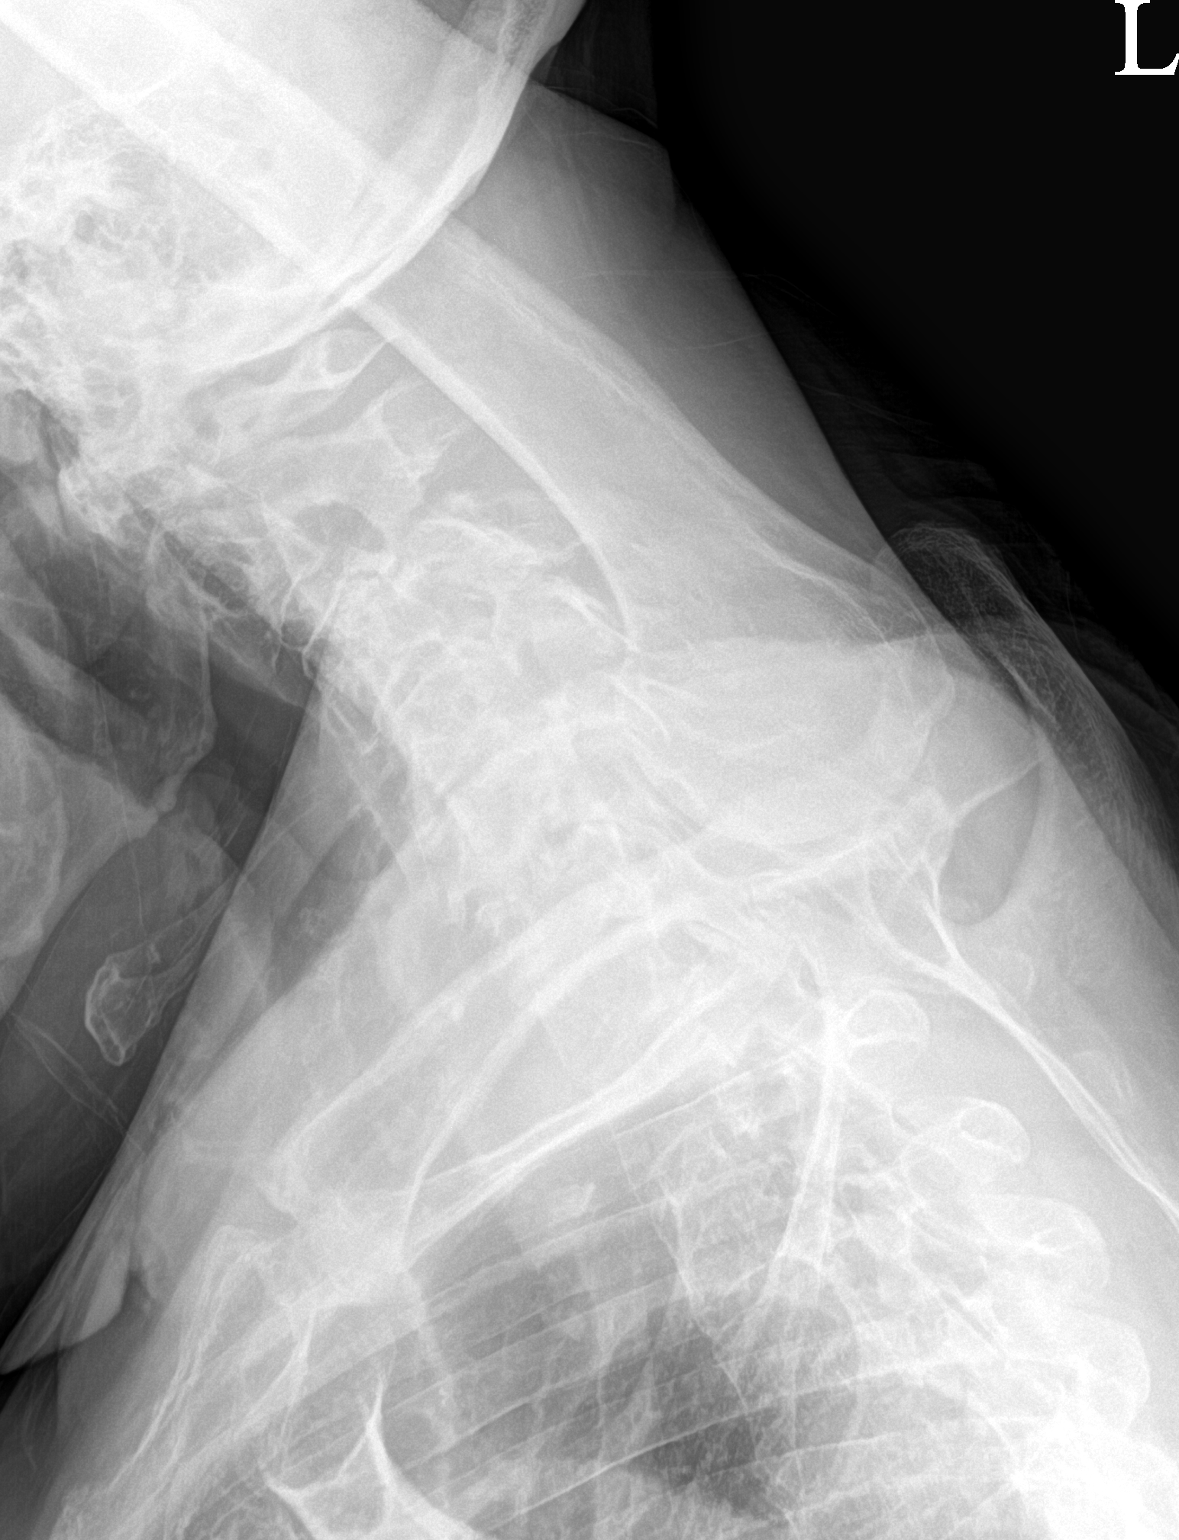

[3 of 3 positions shown; findings below may reference images not displayed]

FINDINGS: There is 60% compression deformity of T11. This is new from
12/11/2018. There is no definite retropulsion of fracture fragments.
Spinal alignment is normal. No significant degenerative changes.
IMPRESSION: 1. 60% compression fracture T11, new from 12/11/2018. Please
correlate clinically.

## 2022-03-16 IMAGING — DX DG LUMBAR SPINE COMPLETE 4+V
5 series · 5 of 5 positions shown · non-contrast
Comparison: 11/16/2018

CLINICAL DATA: Recent falls with low back pain, initial encounter

EXAM:
LUMBAR SPINE - COMPLETE 4+ VIEW

[l-spine ap]
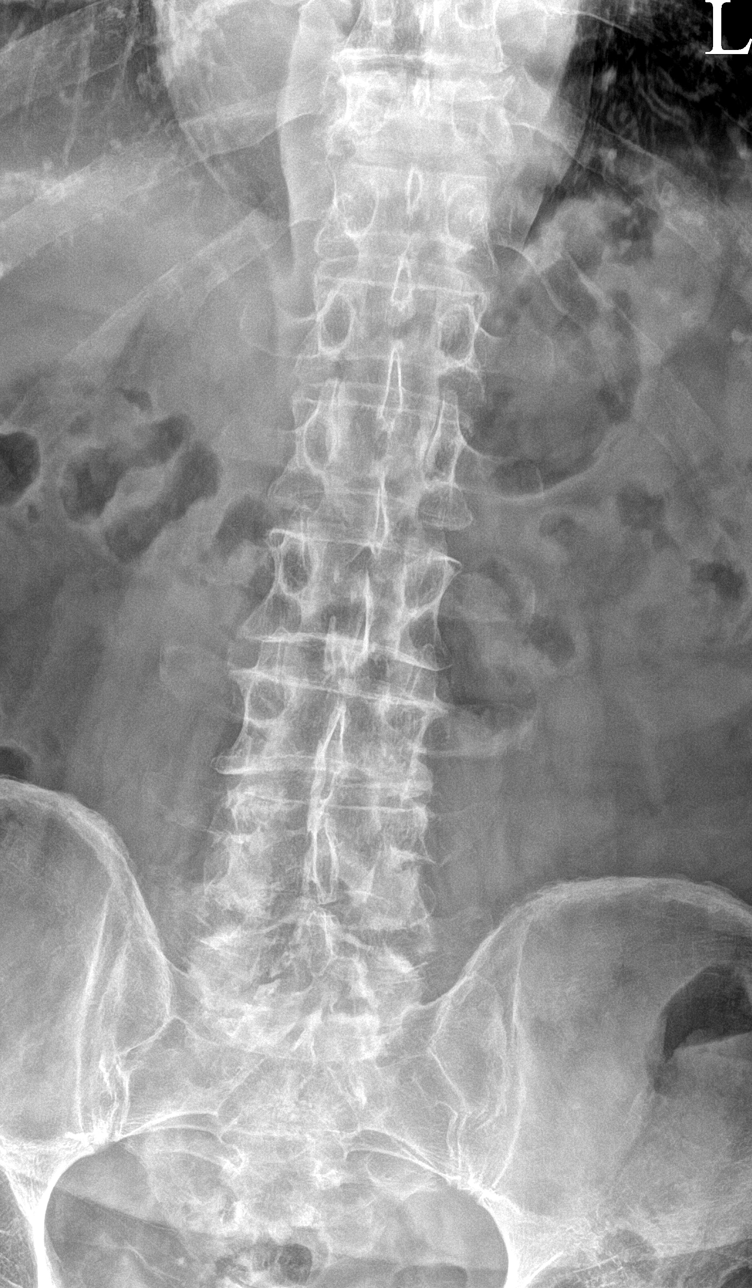

[l-spine obl (1 of 2)]
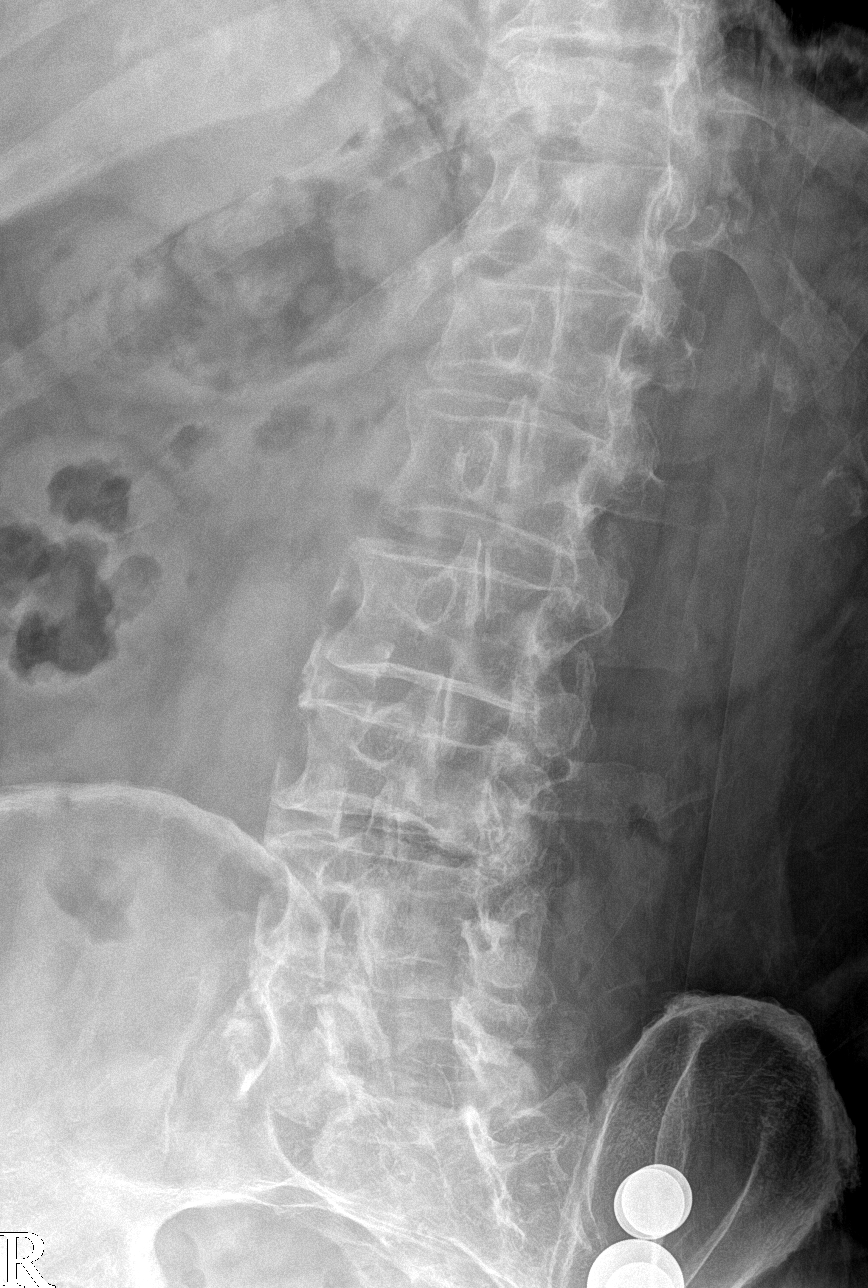

[l-spine obl (2 of 2)]
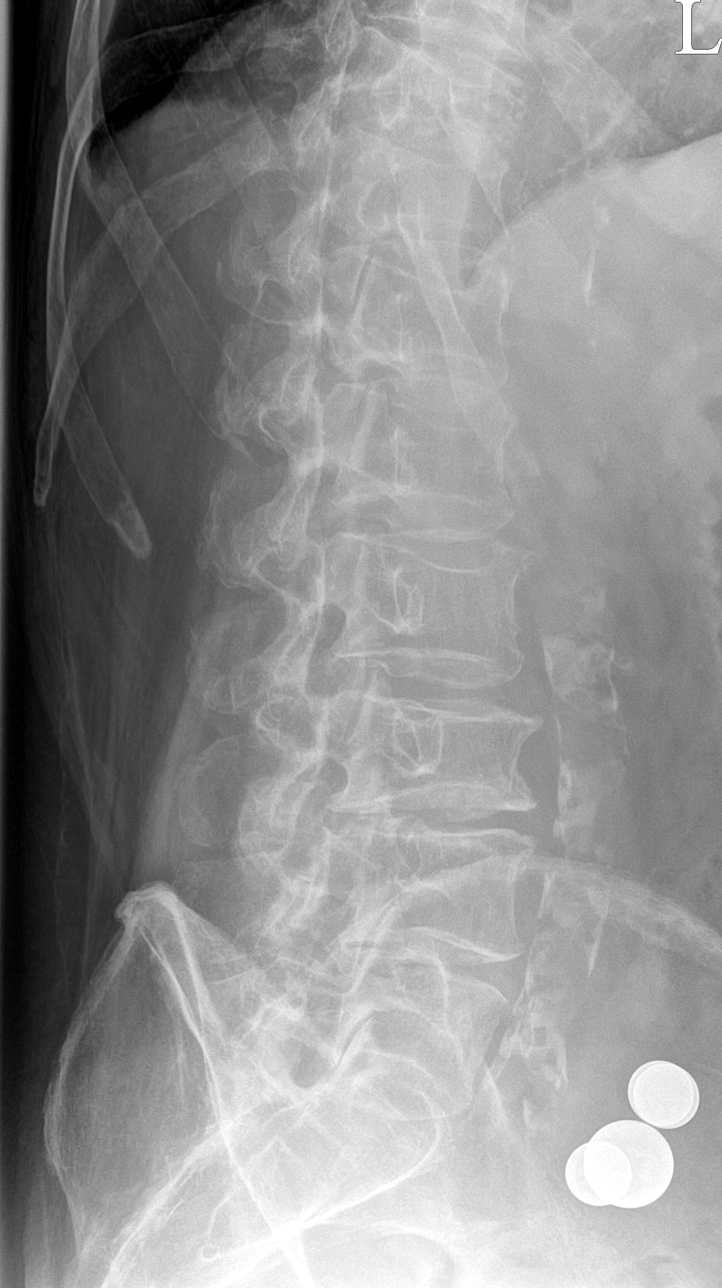

[l-spine lateral]
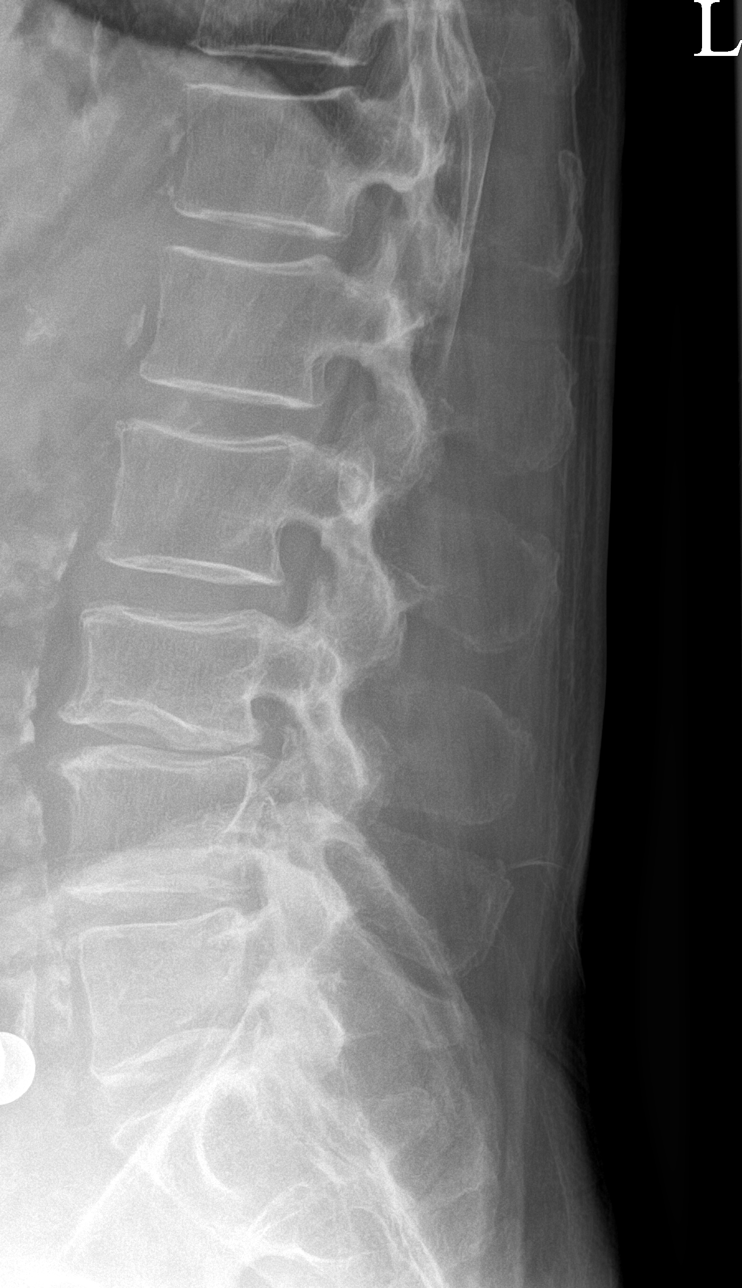

[l-spine spot]
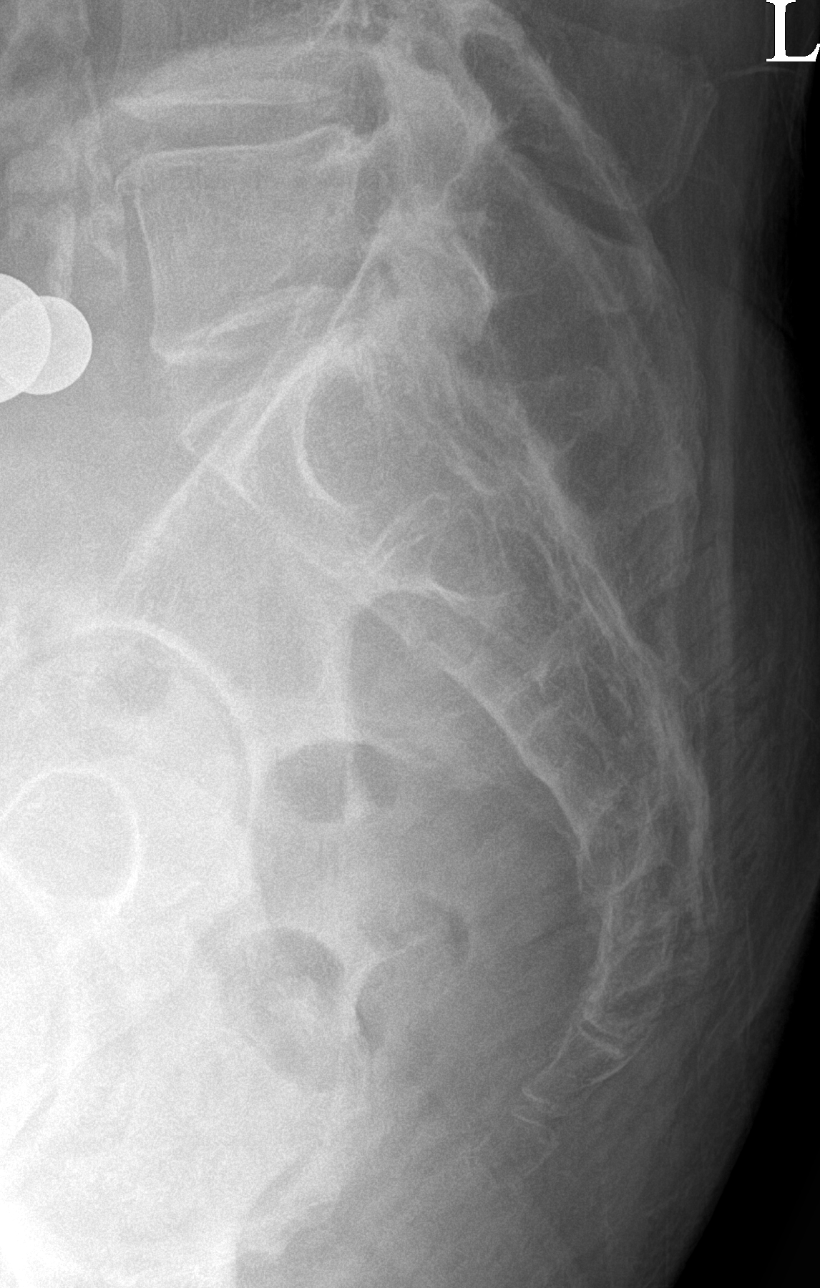

[5 of 5 positions shown; findings below may reference images not displayed]

FINDINGS: Stable inferior endplate compression deformity of L3 is noted. Mild
osteophytic changes are seen. No anterolisthesis is noted. No pars
defects are seen. Mild disc space narrowing at L4-5 is noted. No
soft tissue abnormality is noted.
IMPRESSION: Stable L3 compression deformity in the inferior endplate. The
overall appearance is stable.

## 2022-04-08 ENCOUNTER — Ambulatory Visit: Payer: Self-pay

## 2022-04-08 ENCOUNTER — Encounter: Payer: Self-pay | Admitting: Specialist

## 2022-04-08 ENCOUNTER — Ambulatory Visit (INDEPENDENT_AMBULATORY_CARE_PROVIDER_SITE_OTHER): Payer: Medicare Other | Admitting: Specialist

## 2022-04-08 VITALS — BP 120/76 | HR 71 | Ht 68.0 in | Wt 148.0 lb

## 2022-04-08 DIAGNOSIS — M8588 Other specified disorders of bone density and structure, other site: Secondary | ICD-10-CM

## 2022-04-08 DIAGNOSIS — S22000G Wedge compression fracture of unspecified thoracic vertebra, subsequent encounter for fracture with delayed healing: Secondary | ICD-10-CM

## 2022-04-08 DIAGNOSIS — S32030A Wedge compression fracture of third lumbar vertebra, initial encounter for closed fracture: Secondary | ICD-10-CM | POA: Diagnosis not present

## 2022-04-08 NOTE — Patient Instructions (Signed)
Avoid frequent bending and stooping  No lifting greater than 10 lbs. May use ice or moist heat for pain. Weight loss is of benefit. Best medication for lumbar disc disease is arthritis medications like motrin, celebrex and naprosyn. Exercise is important to improve your indurance and does allow people to function better inspite of back pain.   

## 2022-04-08 NOTE — Progress Notes (Signed)
Office Visit Note   Patient: Manuel Cruz           Date of Birth: Jun 17, 1946           MRN: [id number] Visit Date: 04/08/2022              Requested by: Biagio Borg, MD [address] [place],  [place] [address] PCP: Biagio Borg, MD   Assessment & Plan: Visit Diagnoses:  1. Compression fracture of L3 lumbar vertebra, closed, initial encounter ([place])     Plan: Avoid frequent bending and stooping  No lifting greater than 10 lbs. May use ice or moist heat for pain. Weight loss is of benefit. Best medication for lumbar disc disease is arthritis medications like motrin, celebrex and naprosyn. Exercise is important to improve your indurance and does allow people to function better inspite of back pain.  Follow-Up Instructions: Return in about 3 months (around 07/09/2022).   Orders:  Orders Placed This Encounter  Procedures   XR Lumbar Spine 2-3 Views   XR Thoracic Spine 2 View   No orders of the defined types were placed in this encounter.     Procedures: No procedures performed   Clinical Data: No additional findings.   Subjective: Chief Complaint  Patient presents with   Lower Back - Pain, Follow-up    HPI  Review of Systems   Objective: Vital Signs: BP 120/76   Pulse 71   Ht _0  (1.727 m)   Wt 148 lb (67.1 kg)   BMI 22.50 kg/m   Physical Exam  Ortho Exam  Specialty Comments:  No specialty comments available.  Imaging: No results found.   PMFS History: Patient Active Problem List   Diagnosis Date Noted   Vitamin D deficiency 12/09/2021   Fatty liver 06/05/2021   Hypertriglyceridemia 06/05/2021   Peripheral neuropathy 06/05/2021   Elevated LFTs 05/22/2020   Coronary artery calcification seen on CT scan 11/20/2019   Osteoporosis with pathological fracture 04/27/2018   Compression fracture of L3 lumbar vertebra, closed, initial encounter ([place]) 04/05/2018   Low back pain 03/25/2018   Hyperglycemia 04/26/2017   Depression  04/12/2014   Smoker 04/12/2014   Right sided sciatica 03/15/2013   Habitual alcohol use 03/15/2013   Preventative health care 03/10/2011   OBSTRUCTIVE SLEEP APNEA 06/04/2010   Hyperlipidemia 10/24/2007   Allergic rhinitis 10/24/2007   Past Medical History:  Diagnosis Date   Allergic rhinitis    Allergy    seasonal allergies   GERD (gastroesophageal reflux disease)    on meds   Hyperlipidemia    on meds   Osteopenia 2021   Peripheral neuropathy    Sciatica of right side    recurrent   Sleep apnea    pt denies   Squamous cell carcinoma in situ (SCCIS) 06/15/2005   Outer Right Rim of Ear (Cx3,5FU)   Squamous cell carcinoma in situ (SCCIS) 02/01/2012   Left Sideburn (tx p bx)   Squamous cell carcinoma in situ (SCCIS) 04/05/2016   Left Forearm(Cx3,5FU), Left Sideburn Sup(Cx3,5FU), and Left Sideburn Inf (Cx3,5FU)   Squamous cell carcinoma in situ (SCCIS) 05/06/2016   Left Side Neck (tx p bx)   Squamous cell skin cancer, earlobe    Warts, genital     Family History  Problem Relation Age of Onset   Hyperlipidemia Father    Lymphoma Father 37   Bladder Cancer Father 16   Hernia Brother    Lung cancer Mother 76  Brain cancer Mother 39   Colon polyps Neg Hx    Colon cancer Neg Hx    Esophageal cancer Neg Hx    Rectal cancer Neg Hx    Stomach cancer Neg Hx     Past Surgical History:  Procedure Laterality Date   COLONOSCOPY  08/12/2005   SA-MAC-(good)suprep-TA/tics   LUMBAR DISC SURGERY  2004   LUMBAR [place] SURGERY  05/2018   for comression fx   MOUTH SURGERY  2019   implants   TONSILLECTOMY     Social History   Occupational History   Occupation: insurance    Comment: retired  Tobacco Use   Smoking status: Every Day    Packs/day: 0.25    Types: Cigarettes   Smokeless tobacco: Never  Vaping Use   Vaping Use: Never used  Substance and Sexual Activity   Alcohol use: Yes    Alcohol/week: 20.0 standard drinks of alcohol    Types: 20 Glasses of wine per week    Drug use: No   Sexual activity: Yes

## 2022-04-14 ENCOUNTER — Ambulatory Visit: Payer: Self-pay

## 2022-04-14 ENCOUNTER — Ambulatory Visit: Payer: Medicare Other

## 2022-04-14 ENCOUNTER — Other Ambulatory Visit: Payer: Self-pay | Admitting: Radiology

## 2022-04-14 DIAGNOSIS — S22000G Wedge compression fracture of unspecified thoracic vertebra, subsequent encounter for fracture with delayed healing: Secondary | ICD-10-CM

## 2022-05-26 ENCOUNTER — Ambulatory Visit (INDEPENDENT_AMBULATORY_CARE_PROVIDER_SITE_OTHER)
Admission: RE | Admit: 2022-05-26 | Discharge: 2022-05-26 | Disposition: A | Discharge status: Home / Self Care | Payer: Medicare Other | Source: Ambulatory Visit | Attending: Internal Medicine | Admitting: Internal Medicine

## 2022-05-26 DIAGNOSIS — S32000S Wedge compression fracture of unspecified lumbar vertebra, sequela: Secondary | ICD-10-CM

## 2022-05-26 DIAGNOSIS — E2839 Other primary ovarian failure: Secondary | ICD-10-CM

## 2022-06-06 DIAGNOSIS — Z23 Encounter for immunization: Secondary | ICD-10-CM | POA: Diagnosis not present

## 2022-06-14 ENCOUNTER — Ambulatory Visit (INDEPENDENT_AMBULATORY_CARE_PROVIDER_SITE_OTHER): Payer: Medicare Other | Admitting: Internal Medicine

## 2022-06-14 ENCOUNTER — Encounter: Payer: Self-pay | Admitting: Internal Medicine

## 2022-06-14 VITALS — BP 132/74 | HR 81 | Temp 98.2°F | Ht 68.0 in | Wt 140.0 lb

## 2022-06-14 DIAGNOSIS — E559 Vitamin D deficiency, unspecified: Secondary | ICD-10-CM | POA: Diagnosis not present

## 2022-06-14 DIAGNOSIS — E7800 Pure hypercholesterolemia, unspecified: Secondary | ICD-10-CM | POA: Diagnosis not present

## 2022-06-14 DIAGNOSIS — F17200 Nicotine dependence, unspecified, uncomplicated: Secondary | ICD-10-CM

## 2022-06-14 DIAGNOSIS — M8000XD Age-related osteoporosis with current pathological fracture, unspecified site, subsequent encounter for fracture with routine healing: Secondary | ICD-10-CM

## 2022-06-14 DIAGNOSIS — R739 Hyperglycemia, unspecified: Secondary | ICD-10-CM

## 2022-06-14 MED ORDER — CALCITONIN (SALMON) 200 UNIT/ACT NA SOLN: NASAL | 11 refills | Status: DC

## 2022-06-14 MED ORDER — ATORVASTATIN CALCIUM 80 MG PO TABS: 80.0000 mg | ORAL_TABLET | Freq: Every day | ORAL | 3 refills | Status: DC

## 2022-06-14 NOTE — Assessment & Plan Note (Signed)
Still smoking, not ready to quit

## 2022-06-14 NOTE — Assessment & Plan Note (Signed)
Lab Results  Component Value Date   LDLCALC 61 12/09/2021   Stable, pt to continue current statin lipitor 80 mg

## 2022-06-14 NOTE — Patient Instructions (Addendum)
Please continue all other medications as before, and refills have been done if requested.  Please have the pharmacy call with any other refills you may need.  Please continue your efforts at being more active, low cholesterol diet, and weight control.  Please keep your appointments with your specialists as you may have planned - dermatology in april  We can hold on further lab testing today  Please make an Appointment to return in 6 months, or sooner if needed

## 2022-06-14 NOTE — Progress Notes (Signed)
Patient ID: Manuel Cruz, male   DOB: 08/19/1946, 76 y.o.   MRN: [id number]        Chief Complaint: follow up HTN, HLD and hyperglycemia, skin lesion       HPI:  Manuel Cruz is a 76 y.o. male here overall doing very well.  Pt denies chest pain, increased sob or doe, wheezing, orthopnea, PND, increased LE swelling, palpitations, dizziness or syncope.   Pt denies polydipsia, polyuria, or new focal neuro s/s.    Pt denies fever, wt loss, night sweats, loss of appetite, or other constitutional symptoms  No further bone fractures or heavy lifting.  Has a skin lesion to left head and plans to see dermatology in April, first appt he could get after Dr Denna Haggard retired.  Still drinks ETOH occasionally.  Denies worsening depressive symptoms, suicidal ideation, or panic   Recent DXA with worst t score -1.9, needs treatment.        Wt Readings from Last 3 Encounters:  06/14/22 140 lb (63.5 kg)  04/08/22 148 lb (67.1 kg)  02/10/22 148 lb (67.1 kg)   BP Readings from Last 3 Encounters:  06/14/22 132/74  04/08/22 120/76  02/10/22 125/78         Past Medical History:  Diagnosis Date   Allergic rhinitis    Allergy    seasonal allergies   GERD (gastroesophageal reflux disease)    on meds   Hyperlipidemia    on meds   Osteopenia 2021   Peripheral neuropathy    Sciatica of right side    recurrent   Sleep apnea    pt denies   Squamous cell carcinoma in situ (SCCIS) 06/15/2005   Outer Right Rim of Ear (Cx3,5FU)   Squamous cell carcinoma in situ (SCCIS) 02/01/2012   Left Sideburn (tx p bx)   Squamous cell carcinoma in situ (SCCIS) 04/05/2016   Left Forearm(Cx3,5FU), Left Sideburn Sup(Cx3,5FU), and Left Sideburn Inf (Cx3,5FU)   Squamous cell carcinoma in situ (SCCIS) 05/06/2016   Left Side Neck (tx p bx)   Squamous cell skin cancer, earlobe    Warts, genital    Past Surgical History:  Procedure Laterality Date   COLONOSCOPY  08/12/2005   SA-MAC-(good)suprep-TA/tics   LUMBAR DISC SURGERY   2004   LUMBAR [place] SURGERY  05/2018   for comression fx   MOUTH SURGERY  2019   implants   TONSILLECTOMY      reports that he has been smoking cigarettes. He has been smoking an average of .25 packs per day. He has never used smokeless tobacco. He reports current alcohol use of about 20.0 standard drinks of alcohol per week. He reports that he does not use drugs. family history includes Bladder Cancer (age of onset: 65) in his father; Brain cancer (age of onset: 60) in his mother; Hernia in his brother; Hyperlipidemia in his father; Lung cancer (age of onset: 26) in his mother; Lymphoma (age of onset: 79) in his father. No Known Allergies Current Outpatient Medications on File Prior to Visit  Medication Sig Dispense Refill   aspirin 81 MG EC tablet Take 1 tablet (81 mg total) by mouth daily. 90 tablet 1   b complex vitamins tablet Take 1 tablet by mouth daily.     Carboxymeth-Glycerin-Polysorb (REFRESH OPTIVE ADVANCED OP) Apply 1 drop to eye daily.     Cholecalciferol (VITAMIN D-3 PO) 2,000 Units.     cyclobenzaprine (FLEXERIL) 5 MG tablet Take 1 tablet (5 mg total) by mouth 3 (  three) times daily as needed for muscle spasms. 40 tablet 2   gabapentin (NEURONTIN) 300 MG capsule TAKE 2 CAPSULES 3 TIMES A  DAY 540 capsule 1   ketotifen (ZADITOR) 0.025 % ophthalmic solution Place 1 drop into both eyes 2 (two) times daily as needed.     Loratadine (CLARITIN PO) Take by mouth daily.     Melatonin 3 MG TABS Take 3 mg by mouth at bedtime.     meloxicam (MOBIC) 15 MG tablet Take 1 tablet (15 mg total) by mouth daily. 90 tablet 1   Multiple Minerals-Vitamins (CALCIUM CITRATE PLUS/MAGNESIUM PO)      Multiple Vitamin (MULTIVITAMIN) tablet Take 1 tablet by mouth daily.     naproxen (NAPROSYN) 250 MG tablet Take by mouth daily as needed.     traMADol (ULTRAM) 50 MG tablet Take 1 tablet (50 mg total) by mouth every 6 (six) hours as needed. 30 tablet 0   Vitamin D, Ergocalciferol, (DRISDOL) 1.25 MG (50000  UNIT) CAPS capsule Take 1 capsule (50,000 Units total) by mouth every 7 (seven) days. 5 capsule 0   No current facility-administered medications on file prior to visit.        ROS:  All others reviewed and negative.  Objective        PE:  BP 132/74 (BP Location: Right Arm, Patient Position: Sitting, Cuff Size: Large)   Pulse 81   Temp 98.2 F (36.8 C) (Oral)   Ht _0  (1.727 m)   Wt 140 lb (63.5 kg)   SpO2 98%   BMI 21.29 kg/m                 Constitutional: Pt appears in NAD               HENT: Head: NCAT.                Right Ear: External ear normal.                 Left Ear: External ear normal.                Eyes: . Pupils are equal, round, and reactive to light. Conjunctivae and EOM are normal               Nose: without d/c or deformity               Neck: Neck supple. Gross normal ROM               Cardiovascular: Normal rate and regular rhythm.                 Pulmonary/Chest: Effort normal and breath sounds without rales or wheezing.                Abd:  Soft, NT, ND, + BS, no organomegaly               Neurological: Pt is alert. At baseline orientation, motor grossly intact               Skin: Skin is warm. No rashes, no other new lesions, LE edema - none               Psychiatric: Pt behavior is normal without agitation   Micro: none  Cardiac tracings I have personally interpreted today:  none  Pertinent Radiological findings (summarize): none   Lab Results  Component Value Date   WBC 7.7 12/09/2021   HGB 14.1 12/09/2021  HCT 41.8 12/09/2021   PLT 279.0 12/09/2021   GLUCOSE 95 12/09/2021   CHOL 167 12/09/2021   TRIG 132.0 12/09/2021   HDL 79.20 12/09/2021   LDLDIRECT 42.0 11/27/2020   LDLCALC 61 12/09/2021   ALT 48 12/09/2021   AST 42 (H) 12/09/2021   NA 139 12/09/2021   K 4.5 12/09/2021   CL 103 12/09/2021   CREATININE 0.82 12/09/2021   BUN 13 12/09/2021   CO2 29 12/09/2021   TSH 1.87 12/09/2021   PSA 1.33 12/09/2021   HGBA1C 5.9 12/09/2021    Assessment/Plan:  Manuel Cruz is a 76 y.o. White or Caucasian [1] male with  has a past medical history of Allergic rhinitis, Allergy, GERD (gastroesophageal reflux disease), Hyperlipidemia, Osteopenia (2021), Peripheral neuropathy, Sciatica of right side, Sleep apnea, Squamous cell carcinoma in situ (SCCIS) (06/15/2005), Squamous cell carcinoma in situ (SCCIS) (02/01/2012), Squamous cell carcinoma in situ (SCCIS) (04/05/2016), Squamous cell carcinoma in situ (SCCIS) (05/06/2016), Squamous cell skin cancer, earlobe, and Warts, genital.  Vitamin D deficiency Last vitamin D Lab Results  Component Value Date   VD25OH 50.64 12/09/2021   Stable, cont oral replacement   Smoker Still smoking, not ready to quit  Hyperlipidemia Lab Results  Component Value Date   LDLCALC 61 12/09/2021   Stable, pt to continue current statin lipitor 80 mg   Hyperglycemia Lab Results  Component Value Date   HGBA1C 5.9 12/09/2021   Stable, pt to continue current medical treatment  - diet, wt control,   Osteoporosis with pathological fracture Pt to restart miacalcin asd indefinitely, plan to f/u DXa in 2 yrs Followup: Return in about 6 months (around 12/14/2022).  Cathlean Cower, MD 06/14/2022 1:41 PM [facility]

## 2022-06-14 NOTE — Assessment & Plan Note (Signed)
Last vitamin D Lab Results  Component Value Date   VD25OH 50.64 12/09/2021   Stable, cont oral replacement

## 2022-06-14 NOTE — Assessment & Plan Note (Signed)
Lab Results  Component Value Date   HGBA1C 5.9 12/09/2021   Stable, pt to continue current medical treatment  - diet, wt control,

## 2022-06-14 NOTE — Assessment & Plan Note (Signed)
Pt to restart miacalcin asd indefinitely, plan to f/u DXa in 2 yrs

## 2022-06-17 ENCOUNTER — Ambulatory Visit (INDEPENDENT_AMBULATORY_CARE_PROVIDER_SITE_OTHER): Payer: Medicare Other | Admitting: Specialist

## 2022-06-17 ENCOUNTER — Ambulatory Visit: Payer: Self-pay

## 2022-06-17 ENCOUNTER — Other Ambulatory Visit: Payer: Self-pay | Admitting: Internal Medicine

## 2022-06-17 ENCOUNTER — Encounter: Payer: Self-pay | Admitting: Specialist

## 2022-06-17 VITALS — BP 138/76 | HR 71 | Ht 68.0 in | Wt 140.0 lb

## 2022-06-17 DIAGNOSIS — M8000XD Age-related osteoporosis with current pathological fracture, unspecified site, subsequent encounter for fracture with routine healing: Secondary | ICD-10-CM

## 2022-06-17 DIAGNOSIS — S22000G Wedge compression fracture of unspecified thoracic vertebra, subsequent encounter for fracture with delayed healing: Secondary | ICD-10-CM | POA: Diagnosis not present

## 2022-06-17 DIAGNOSIS — S32030A Wedge compression fracture of third lumbar vertebra, initial encounter for closed fracture: Secondary | ICD-10-CM

## 2022-06-17 NOTE — Progress Notes (Signed)
Office Visit Note   Patient: Manuel Cruz           Date of Birth: [birth year]           MRN: [id number] Visit Date: 06/17/2022              Requested by: Biagio Borg, MD [address] [place],  [place] [address] PCP: Biagio Borg, MD   Assessment & Plan: Visit Diagnoses:  1. Compression fracture of thoracic vertebra with delayed healing, unspecified thoracic vertebral level, subsequent encounter   2. Compression fracture of L3 lumbar vertebra, closed, initial encounter ([place])     Plan: Avoid frequent bending and stooping  No lifting greater than 10 lbs. May use ice or moist heat for pain. Weight loss is of benefit. Best medication for lumbar disc disease is arthritis medications like motrin, celebrex and naprosyn. Exercise is important to improve your indurance and does allow people to function better inspite of back pain.  Bone density is low at -1.9 and this should be follow up in 3 year with repeat study  Follow-Up Instructions: No follow-ups on file.   Orders:  Orders Placed This Encounter  Procedures   XR Lumbar Spine 2-3 Views   XR Thoracic Spine 2 View   No orders of the defined types were placed in this encounter.     Procedures: No procedures performed   Clinical Data: No additional findings.   Subjective: Chief Complaint  Patient presents with   Lower Back - Fracture, Follow-up    76 year old male with history of L3 compression fracture he is on Vitamin D supplements and calcium supplement.He had persistent pain and has participated in a PT program and is doing therapy on his own. We have recommended daily exercise.     Review of Systems  Constitutional: Negative.   HENT: Negative.    Eyes: Negative.   Respiratory: Negative.    Cardiovascular: Negative.   Gastrointestinal: Negative.   Endocrine: Negative.   Genitourinary: Negative.   Musculoskeletal: Negative.   Skin: Negative.   Allergic/Immunologic: Negative.   Neurological:  Negative.   Hematological: Negative.   Psychiatric/Behavioral: Negative.       Objective: Vital Signs: BP 138/76   Pulse 71   Ht _0  (1.727 m)   Wt 140 lb (63.5 kg)   BMI 21.29 kg/m   Physical Exam Constitutional:      Appearance: He is well-developed.  HENT:     Head: Normocephalic and atraumatic.  Eyes:     Pupils: Pupils are equal, round, and reactive to light.  Pulmonary:     Effort: Pulmonary effort is normal.     Breath sounds: Normal breath sounds.  Abdominal:     General: Bowel sounds are normal.     Palpations: Abdomen is soft.  Musculoskeletal:     Cervical back: Normal range of motion and neck supple.  Skin:    General: Skin is warm and dry.  Neurological:     Mental Status: He is alert and oriented to person, place, and time.  Psychiatric:        Behavior: Behavior normal.        Thought Content: Thought content normal.        Judgment: Judgment normal.     Back Exam   Tenderness  The patient is experiencing no tenderness.   Range of Motion  Extension:  abnormal  Flexion:  abnormal  Lateral bend right:  normal  Lateral bend  left:  normal  Rotation right:  normal  Rotation left:  normal       Specialty Comments:  No specialty comments available.  Imaging: No results found.   PMFS History: Patient Active Problem List   Diagnosis Date Noted   Vitamin D deficiency 12/09/2021   Fatty liver 06/05/2021   Hypertriglyceridemia 06/05/2021   Peripheral neuropathy 06/05/2021   Elevated LFTs 05/22/2020   Coronary artery calcification seen on CT scan 11/20/2019   Osteoporosis with pathological fracture 04/27/2018   Compression fracture of L3 lumbar vertebra, closed, initial encounter ([place]) 04/05/2018   Low back pain 03/25/2018   Hyperglycemia 04/26/2017   Depression 04/12/2014   Smoker 04/12/2014   Right sided sciatica 03/15/2013   Habitual alcohol use 03/15/2013   Preventative health care 03/10/2011   OBSTRUCTIVE SLEEP APNEA  06/04/2010   Hyperlipidemia 10/24/2007   Allergic rhinitis 10/24/2007   Past Medical History:  Diagnosis Date   Allergic rhinitis    Allergy    seasonal allergies   GERD (gastroesophageal reflux disease)    on meds   Hyperlipidemia    on meds   Osteopenia 2021   Peripheral neuropathy    Sciatica of right side    recurrent   Sleep apnea    pt denies   Squamous cell carcinoma in situ (SCCIS) 06/15/2005   Outer Right Rim of Ear (Cx3,5FU)   Squamous cell carcinoma in situ (SCCIS) 02/01/2012   Left Sideburn (tx p bx)   Squamous cell carcinoma in situ (SCCIS) 04/05/2016   Left Forearm(Cx3,5FU), Left Sideburn Sup(Cx3,5FU), and Left Sideburn Inf (Cx3,5FU)   Squamous cell carcinoma in situ (SCCIS) 05/06/2016   Left Side Neck (tx p bx)   Squamous cell skin cancer, earlobe    Warts, genital     Family History  Problem Relation Age of Onset   Hyperlipidemia Father    Lymphoma Father 65   Bladder Cancer Father 45   Hernia Brother    Lung cancer Mother 19   Brain cancer Mother 45   Colon polyps Neg Hx    Colon cancer Neg Hx    Esophageal cancer Neg Hx    Rectal cancer Neg Hx    Stomach cancer Neg Hx     Past Surgical History:  Procedure Laterality Date   COLONOSCOPY  08/12/2005   SA-MAC-(good)suprep-TA/tics   LUMBAR [place] SURGERY  2004   LUMBAR [place] SURGERY  05/2018   for comression fx   MOUTH SURGERY  2019   implants   TONSILLECTOMY     Social History   Occupational History   Occupation: insurance    Comment: retired  Tobacco Use   Smoking status: Every Day    Packs/day: 0.25    Types: Cigarettes   Smokeless tobacco: Never  Vaping Use   Vaping Use: Never used  Substance and Sexual Activity   Alcohol use: Yes    Alcohol/week: 20.0 standard drinks of alcohol    Types: 20 Glasses of wine per week   Drug use: No   Sexual activity: Yes

## 2022-07-10 DIAGNOSIS — Z23 Encounter for immunization: Secondary | ICD-10-CM | POA: Diagnosis not present

## 2022-07-21 ENCOUNTER — Other Ambulatory Visit: Payer: Self-pay

## 2022-07-21 MED ORDER — GABAPENTIN 300 MG PO CAPS: ORAL_CAPSULE | ORAL | 1 refills | Status: DC

## 2022-07-21 NOTE — Telephone Encounter (Signed)
Pt requesting medication refill

## 2022-07-22 DIAGNOSIS — H35371 Puckering of macula, right eye: Secondary | ICD-10-CM | POA: Diagnosis not present

## 2022-07-22 DIAGNOSIS — H4322 Crystalline deposits in vitreous body, left eye: Secondary | ICD-10-CM | POA: Diagnosis not present

## 2022-07-22 DIAGNOSIS — H2513 Age-related nuclear cataract, bilateral: Secondary | ICD-10-CM | POA: Diagnosis not present

## 2022-10-21 ENCOUNTER — Other Ambulatory Visit: Payer: Self-pay | Admitting: Internal Medicine

## 2022-10-21 NOTE — Telephone Encounter (Signed)
Please refill as per office routine med refill policy (all routine meds to be refilled for 3 mo or monthly (per pt preference) up to one year from last visit, then month to month grace period for 3 mo, then further med refills will have to be denied) ? ?

## 2022-11-23 ENCOUNTER — Ambulatory Visit (INDEPENDENT_AMBULATORY_CARE_PROVIDER_SITE_OTHER): Payer: Medicare Other | Admitting: Internal Medicine

## 2022-11-23 VITALS — BP 122/76 | HR 76 | Temp 98.8°F | Ht 68.0 in | Wt 143.0 lb

## 2022-11-23 DIAGNOSIS — R739 Hyperglycemia, unspecified: Secondary | ICD-10-CM | POA: Diagnosis not present

## 2022-11-23 DIAGNOSIS — R051 Acute cough: Secondary | ICD-10-CM | POA: Diagnosis not present

## 2022-11-23 DIAGNOSIS — E559 Vitamin D deficiency, unspecified: Secondary | ICD-10-CM | POA: Diagnosis not present

## 2022-11-23 DIAGNOSIS — F17200 Nicotine dependence, unspecified, uncomplicated: Secondary | ICD-10-CM | POA: Diagnosis not present

## 2022-11-23 LAB — POC SOFIA SARS ANTIGEN FIA: SARS Coronavirus 2 Ag: NEGATIVE

## 2022-11-23 LAB — POCT RESPIRATORY SYNCYTIAL VIRUS: RSV Rapid Ag: NEGATIVE

## 2022-11-23 LAB — POC INFLUENZA A&B (BINAX/QUICKVUE)
Influenza A, POC: NEGATIVE
Influenza B, POC: NEGATIVE

## 2022-11-23 MED ORDER — LEVOFLOXACIN 500 MG PO TABS: 500.0000 mg | ORAL_TABLET | Freq: Every day | ORAL | 0 refills | Status: AC

## 2022-11-23 MED ORDER — HYDROCODONE BIT-HOMATROP MBR 5-1.5 MG/5ML PO SOLN: 5.0000 mL | Freq: Four times a day (QID) | ORAL | 0 refills | Status: AC | PRN

## 2022-11-23 NOTE — Patient Instructions (Signed)
Your testing was negative for COVID, Flu, and RSV infections  Please take all new medication as prescribed - the antibiotic, and cough medicine  Please continue all other medications as before, and refills have been done if requested.  Please have the pharmacy call with any other refills you may need.  Please keep your appointments with your specialists as you may have planned

## 2022-11-23 NOTE — Progress Notes (Unsigned)
Patient ID: Manuel Cruz, male   DOB: May 29, 1946, 77 y.o.   MRN: [id number]  .      Chief Complaint: follow up cough and feverish x 2 wks       HPI:  Manuel Cruz is a 77 y.o. male Here with acute onset mild to mod 2 wks ST, HA, general weakness and malaise, with prod cough greenish sputum, but Pt denies chest pain, increased sob or doe, wheezing, orthopnea, PND, increased LE swelling, palpitations, dizziness or syncope.  Covid neg at home yesterday   Pt denies polydipsia, polyuria, or new focal neuro s/s.    Pt denies fever, wt loss, night sweats, loss of appetite, or other constitutional symptoms         Wt Readings from Last 3 Encounters:  11/23/22 143 lb (64.9 kg)  06/17/22 140 lb (63.5 kg)  06/14/22 140 lb (63.5 kg)   BP Readings from Last 3 Encounters:  11/23/22 122/76  06/17/22 138/76  06/14/22 132/74         Past Medical History:  Diagnosis Date   Allergic rhinitis    Allergy    seasonal allergies   GERD (gastroesophageal reflux disease)    on meds   Hyperlipidemia    on meds   Osteopenia 2021   Peripheral neuropathy    Sciatica of right side    recurrent   Sleep apnea    pt denies   Squamous cell carcinoma in situ (SCCIS) 06/15/2005   Outer Right Rim of Ear (Cx3,5FU)   Squamous cell carcinoma in situ (SCCIS) 02/01/2012   Left Sideburn (tx p bx)   Squamous cell carcinoma in situ (SCCIS) 04/05/2016   Left Forearm(Cx3,5FU), Left Sideburn Sup(Cx3,5FU), and Left Sideburn Inf (Cx3,5FU)   Squamous cell carcinoma in situ (SCCIS) 05/06/2016   Left Side Neck (tx p bx)   Squamous cell skin cancer, earlobe    Warts, genital    Past Surgical History:  Procedure Laterality Date   COLONOSCOPY  08/12/2005   SA-MAC-(good)suprep-TA/tics   LUMBAR DISC SURGERY  2004   LUMBAR [place] SURGERY  05/2018   for comression fx   MOUTH SURGERY  2019   implants   TONSILLECTOMY      reports that he has been smoking cigarettes. He has been smoking an average of .25 packs per day. He  has never used smokeless tobacco. He reports current alcohol use of about 20.0 standard drinks of alcohol per week. He reports that he does not use drugs. family history includes Bladder Cancer (age of onset: 57) in his father; Brain cancer (age of onset: 39) in his mother; Hernia in his brother; Hyperlipidemia in his father; Lung cancer (age of onset: 41) in his mother; Lymphoma (age of onset: 21) in his father. No Known Allergies Current Outpatient Medications on File Prior to Visit  Medication Sig Dispense Refill   aspirin 81 MG EC tablet Take 1 tablet (81 mg total) by mouth daily. 90 tablet 1   atorvastatin (LIPITOR) 80 MG tablet TAKE 1 TABLET DAILY 90 tablet 2   b complex vitamins tablet Take 1 tablet by mouth daily.     calcitonin, salmon, (MIACALCIN/FORTICAL) 200 UNIT/ACT nasal spray Spray one spray into one nostril and alternate nostril every day 3.7 mL 11   Carboxymeth-Glycerin-Polysorb (REFRESH OPTIVE ADVANCED OP) Apply 1 drop to eye daily.     Cholecalciferol (VITAMIN D-3 PO) 2,000 Units.     cyclobenzaprine (FLEXERIL) 5 MG tablet Take 1 tablet (5 mg total) by  mouth 3 (three) times daily as needed for muscle spasms. 40 tablet 2   gabapentin (NEURONTIN) 300 MG capsule TAKE 2 CAPSULES 3 TIMES A  DAY 540 capsule 1   ketotifen (ZADITOR) 0.025 % ophthalmic solution Place 1 drop into both eyes 2 (two) times daily as needed.     Loratadine (CLARITIN PO) Take by mouth daily.     Melatonin 3 MG TABS Take 3 mg by mouth at bedtime.     meloxicam (MOBIC) 15 MG tablet Take 1 tablet (15 mg total) by mouth daily. 90 tablet 1   Multiple Minerals-Vitamins (CALCIUM CITRATE PLUS/MAGNESIUM PO)      Multiple Vitamin (MULTIVITAMIN) tablet Take 1 tablet by mouth daily.     naproxen (NAPROSYN) 250 MG tablet Take by mouth daily as needed.     traMADol (ULTRAM) 50 MG tablet Take 1 tablet (50 mg total) by mouth every 6 (six) hours as needed. 30 tablet 0   Vitamin D, Ergocalciferol, (DRISDOL) 1.25 MG (50000  UNIT) CAPS capsule Take 1 capsule (50,000 Units total) by mouth every 7 (seven) days. 5 capsule 0   No current facility-administered medications on file prior to visit.        ROS:  All others reviewed and negative.  Objective        PE:  BP 122/76   Pulse 76   Temp 98.8 F (37.1 C) (Oral)   Ht '5\' 8"'$  (1.727 m)   Wt 143 lb (64.9 kg)   SpO2 95%   BMI 21.74 kg/m                 Constitutional: Pt appears in NAD               HENT: Head: NCAT.                Right Ear: External ear normal.                 Left Ear: External ear normal.                Eyes: . Pupils are equal, round, and reactive to light. Conjunctivae and EOM are normal               Nose: without d/c or deformity               Neck: Neck supple. Gross normal ROM               Cardiovascular: Normal rate and regular rhythm.                 Pulmonary/Chest: Effort normal and breath sounds decreased without rales but with wheezing.                               Neurological: Pt is alert. At baseline orientation, motor grossly intact               Skin: Skin is warm. No rashes, no other new lesions, LE edema - none               Psychiatric: Pt behavior is normal without agitation   Micro: none  Cardiac tracings I have personally interpreted today:  none  Pertinent Radiological findings (summarize): none   Lab Results  Component Value Date   WBC 7.7 12/09/2021   HGB 14.1 12/09/2021   HCT 41.8 12/09/2021   PLT 279.0 12/09/2021   GLUCOSE 95  12/09/2021   CHOL 167 12/09/2021   TRIG 132.0 12/09/2021   HDL 79.20 12/09/2021   LDLDIRECT 42.0 11/27/2020   LDLCALC 61 12/09/2021   ALT 48 12/09/2021   AST 42 (H) 12/09/2021   NA 139 12/09/2021   K 4.5 12/09/2021   CL 103 12/09/2021   CREATININE 0.82 12/09/2021   BUN 13 12/09/2021   CO2 29 12/09/2021   TSH 1.87 12/09/2021   PSA 1.33 12/09/2021   HGBA1C 5.9 12/09/2021   POCT - Covid - neg, RSV - neg, Flu - neg  Assessment/Plan:  Manuel Cruz is a 77 y.o.  White or Caucasian [1] male with  has a past medical history of Allergic rhinitis, Allergy, GERD (gastroesophageal reflux disease), Hyperlipidemia, Osteopenia (2021), Peripheral neuropathy, Sciatica of right side, Sleep apnea, Squamous cell carcinoma in situ (SCCIS) (06/15/2005), Squamous cell carcinoma in situ (SCCIS) (02/01/2012), Squamous cell carcinoma in situ (SCCIS) (04/05/2016), Squamous cell carcinoma in situ (SCCIS) (05/06/2016), Squamous cell skin cancer, earlobe, and Warts, genital.  Cough Mild to mod, c/w bronchitis vs pna, for antibx course levaquin 500 qd course, cough med prn, declines cxr for now,  to f/u any worsening symptoms or concerns   Hyperglycemia Lab Results  Component Value Date   HGBA1C 5.9 12/09/2021   Stable, pt to continue current medical treatment - diet, wt control   Smoker Pt counseled to quit, pt not ready  Vitamin D deficiency Last vitamin D Lab Results  Component Value Date   VD25OH 50.64 12/09/2021   Stable, cont oral replacement  Followup: Return if symptoms worsen or fail to improve.  Cathlean Cower, MD 11/25/2022 7:56 PM [facility]

## 2022-11-25 ENCOUNTER — Encounter: Payer: Self-pay | Admitting: Internal Medicine

## 2022-11-25 DIAGNOSIS — R059 Cough, unspecified: Secondary | ICD-10-CM | POA: Insufficient documentation

## 2022-11-25 NOTE — Assessment & Plan Note (Signed)
Last vitamin D Lab Results  Component Value Date   VD25OH 50.64 12/09/2021   Stable, cont oral replacement  

## 2022-11-25 NOTE — Assessment & Plan Note (Signed)
Mild to mod, c/w bronchitis vs pna, for antibx course levaquin 500 qd course, cough med prn, declines cxr for now,  to f/u any worsening symptoms or concerns

## 2022-11-25 NOTE — Assessment & Plan Note (Signed)
Pt counseled to quit, pt not ready 

## 2022-11-25 NOTE — Assessment & Plan Note (Signed)
Lab Results  Component Value Date   HGBA1C 5.9 12/09/2021   Stable, pt to continue current medical treatment - diet, wt control

## 2022-11-29 ENCOUNTER — Ambulatory Visit: Payer: Medicare Other | Admitting: Internal Medicine

## 2022-12-06 ENCOUNTER — Telehealth: Payer: Self-pay

## 2022-12-06 NOTE — Telephone Encounter (Signed)
Contacted Manuel Cruz to schedule their annual wellness visit. Appointment made for 12/14/22.  Norton Blizzard, [place] ([facility])  [facility] [phone]

## 2022-12-14 ENCOUNTER — Ambulatory Visit (INDEPENDENT_AMBULATORY_CARE_PROVIDER_SITE_OTHER): Payer: Medicare Other

## 2022-12-14 ENCOUNTER — Ambulatory Visit (INDEPENDENT_AMBULATORY_CARE_PROVIDER_SITE_OTHER): Payer: Medicare Other | Admitting: Internal Medicine

## 2022-12-14 VITALS — BP 112/66 | HR 81 | Temp 98.9°F | Ht 68.0 in | Wt 142.0 lb

## 2022-12-14 DIAGNOSIS — E538 Deficiency of other specified B group vitamins: Secondary | ICD-10-CM | POA: Diagnosis not present

## 2022-12-14 DIAGNOSIS — M8588 Other specified disorders of bone density and structure, other site: Secondary | ICD-10-CM

## 2022-12-14 DIAGNOSIS — N320 Bladder-neck obstruction: Secondary | ICD-10-CM

## 2022-12-14 DIAGNOSIS — E559 Vitamin D deficiency, unspecified: Secondary | ICD-10-CM | POA: Diagnosis not present

## 2022-12-14 DIAGNOSIS — M8580 Other specified disorders of bone density and structure, unspecified site: Secondary | ICD-10-CM | POA: Insufficient documentation

## 2022-12-14 DIAGNOSIS — Z0000 Encounter for general adult medical examination without abnormal findings: Secondary | ICD-10-CM | POA: Diagnosis not present

## 2022-12-14 DIAGNOSIS — E7800 Pure hypercholesterolemia, unspecified: Secondary | ICD-10-CM

## 2022-12-14 DIAGNOSIS — R739 Hyperglycemia, unspecified: Secondary | ICD-10-CM | POA: Diagnosis not present

## 2022-12-14 DIAGNOSIS — F17200 Nicotine dependence, unspecified, uncomplicated: Secondary | ICD-10-CM | POA: Diagnosis not present

## 2022-12-14 LAB — CBC WITH DIFFERENTIAL/PLATELET
Basophils Absolute: 0.1 10*3/uL (ref 0.0–0.1)
Basophils Relative: 1.3 % (ref 0.0–3.0)
Eosinophils Absolute: 0.3 10*3/uL (ref 0.0–0.7)
Eosinophils Relative: 3.5 % (ref 0.0–5.0)
HCT: 42.6 % (ref 39.0–52.0)
Hemoglobin: 14.7 g/dL (ref 13.0–17.0)
Lymphocytes Relative: 32.3 % (ref 12.0–46.0)
Lymphs Abs: 2.5 10*3/uL (ref 0.7–4.0)
MCHC: 34.4 g/dL (ref 30.0–36.0)
MCV: 103.3 fl — ABNORMAL HIGH (ref 78.0–100.0)
Monocytes Absolute: 1.2 10*3/uL — ABNORMAL HIGH (ref 0.1–1.0)
Monocytes Relative: 14.9 % — ABNORMAL HIGH (ref 3.0–12.0)
Neutro Abs: 3.8 10*3/uL (ref 1.4–7.7)
Neutrophils Relative %: 48 % (ref 43.0–77.0)
Platelets: 245 10*3/uL (ref 150.0–400.0)
RBC: 4.13 Mil/uL — ABNORMAL LOW (ref 4.22–5.81)
RDW: 13.2 % (ref 11.5–15.5)
WBC: 7.8 10*3/uL (ref 4.0–10.5)

## 2022-12-14 LAB — BASIC METABOLIC PANEL
BUN: 14 mg/dL (ref 6–23)
CO2: 27 mEq/L (ref 19–32)
Calcium: 9.3 mg/dL (ref 8.4–10.5)
Chloride: 105 mEq/L (ref 96–112)
Creatinine, Ser: 0.95 mg/dL (ref 0.40–1.50)
GFR: 78.37 mL/min (ref >60.00)
Glucose, Bld: 85 mg/dL (ref 70–99)
Potassium: 4.7 mEq/L (ref 3.5–5.1)
Sodium: 139 mEq/L (ref 135–145)

## 2022-12-14 LAB — HEPATIC FUNCTION PANEL
ALT: 80 U/L — ABNORMAL HIGH (ref 0–53)
AST: 64 U/L — ABNORMAL HIGH (ref 0–37)
Albumin: 4.1 g/dL (ref 3.5–5.2)
Alkaline Phosphatase: 71 U/L (ref 39–117)
Bilirubin, Direct: 0.2 mg/dL (ref 0.0–0.3)
Total Bilirubin: 0.6 mg/dL (ref 0.2–1.2)
Total Protein: 6.8 g/dL (ref 6.0–8.3)

## 2022-12-14 LAB — LIPID PANEL
Cholesterol: 165 mg/dL (ref 0–200)
HDL: 86.3 mg/dL (ref >39.00)
LDL Cholesterol: 42 mg/dL (ref 0–99)
NonHDL: 78.29
Total CHOL/HDL Ratio: 2
Triglycerides: 181 mg/dL — ABNORMAL HIGH (ref 0.0–149.0)
VLDL: 36.2 mg/dL (ref 0.0–40.0)

## 2022-12-14 LAB — VITAMIN D 25 HYDROXY (VIT D DEFICIENCY, FRACTURES): VITD: 54.26 ng/mL (ref 30.00–100.00)

## 2022-12-14 LAB — VITAMIN B12: Vitamin B-12: 260 pg/mL (ref 211–911)

## 2022-12-14 LAB — TSH: TSH: 2.15 u[IU]/mL (ref 0.35–5.50)

## 2022-12-14 LAB — HEMOGLOBIN A1C: Hgb A1c MFr Bld: 5.8 % (ref 4.6–6.5)

## 2022-12-14 LAB — PSA: PSA: 1.67 ng/mL (ref 0.10–4.00)

## 2022-12-14 NOTE — Progress Notes (Unsigned)
Patient ID: Manuel Cruz, male   DOB: 02/01/1946, 77 y.o.   MRN: [id number]         Chief Complaint:: yearly exam       HPI:  Manuel Cruz is a 77 y.o. male here                           Wt Readings from Last 3 Encounters:  12/14/22 142 lb (64.4 kg)  11/23/22 143 lb (64.9 kg)  06/17/22 140 lb (63.5 kg)   BP Readings from Last 3 Encounters:  12/14/22 112/66  11/23/22 122/76  06/17/22 138/76   Immunization History  Administered Date(s) Administered   Fluad Quad(high Dose 65+) 06/02/2020, 06/05/2021   H1N1 08/13/2008   Influenza Whole 06/13/2008   Influenza, High Dose Seasonal PF 06/08/2018, 04/30/2019, 04/30/2019, 06/07/2022   Influenza,inj,Quad PF,6+ Mos 06/11/2014   Influenza-Unspecified 04/25/2019   PFIZER Comirnaty(Gray Top)Covid-19 Tri-Sucrose Vaccine 12/30/2020   PFIZER(Purple Top)SARS-COV-2 Vaccination 10/08/2019, 10/29/2019   Pfizer Covid-19 Vaccine Bivalent Booster 63yrs & up 08/08/2021   Pneumococcal Conjugate-13 03/15/2013, 04/10/2014   Pneumococcal Polysaccharide-23 04/17/2015   Respiratory Syncytial Virus Vaccine,Recomb Aduvanted(Arexvy) 08/07/2022   Td 10/24/2008   Tdap 11/08/2018   Zoster Recombinat (Shingrix) 06/02/2017, 11/27/2017   Zoster, Live 01/19/2010  There are no preventive care reminders to display for this patient.    Past Medical History:  Diagnosis Date   Allergic rhinitis    Allergy    seasonal allergies   GERD (gastroesophageal reflux disease)    on meds   Hyperlipidemia    on meds   Osteopenia 2021   Peripheral neuropathy    Sciatica of right side    recurrent   Sleep apnea    pt denies   Squamous cell carcinoma in situ (SCCIS) 06/15/2005   Outer Right Rim of Ear (Cx3,5FU)   Squamous cell carcinoma in situ (SCCIS) 02/01/2012   Left Sideburn (tx p bx)   Squamous cell carcinoma in situ (SCCIS) 04/05/2016   Left Forearm(Cx3,5FU), Left Sideburn Sup(Cx3,5FU), and Left Sideburn Inf (Cx3,5FU)   Squamous cell carcinoma in situ  (SCCIS) 05/06/2016   Left Side Neck (tx p bx)   Squamous cell skin cancer, earlobe    Warts, genital    Past Surgical History:  Procedure Laterality Date   COLONOSCOPY  08/12/2005   SA-MAC-(good)suprep-TA/tics   LUMBAR DISC SURGERY  2004   LUMBAR [place] SURGERY  05/2018   for comression fx   MOUTH SURGERY  2019   implants   TONSILLECTOMY      reports that he has been smoking cigarettes. He has been smoking an average of .25 packs per day. He has never used smokeless tobacco. He reports current alcohol use of about 20.0 standard drinks of alcohol per week. He reports that he does not use drugs. family history includes Bladder Cancer (age of onset: 49) in his father; Brain cancer (age of onset: 55) in his mother; Hernia in his brother; Hyperlipidemia in his father; Lung cancer (age of onset: 79) in his mother; Lymphoma (age of onset: 83) in his father. No Known Allergies Current Outpatient Medications on File Prior to Visit  Medication Sig Dispense Refill   aspirin 81 MG EC tablet Take 1 tablet (81 mg total) by mouth daily. 90 tablet 1   atorvastatin (LIPITOR) 80 MG tablet TAKE 1 TABLET DAILY 90 tablet 2   b complex vitamins tablet Take 1 tablet by mouth daily.     calcitonin,  salmon, (MIACALCIN/FORTICAL) 200 UNIT/ACT nasal spray Spray one spray into one nostril and alternate nostril every day 3.7 mL 11   Carboxymeth-Glycerin-Polysorb (REFRESH OPTIVE ADVANCED OP) Apply 1 drop to eye daily.     Cholecalciferol (VITAMIN D-3 PO) 2,000 Units.     cyclobenzaprine (FLEXERIL) 5 MG tablet Take 1 tablet (5 mg total) by mouth 3 (three) times daily as needed for muscle spasms. 40 tablet 2   gabapentin (NEURONTIN) 300 MG capsule TAKE 2 CAPSULES 3 TIMES A  DAY 540 capsule 1   ketotifen (ZADITOR) 0.025 % ophthalmic solution Place 1 drop into both eyes 2 (two) times daily as needed.     Loratadine (CLARITIN PO) Take by mouth daily.     Melatonin 3 MG TABS Take 3 mg by mouth at bedtime.     meloxicam  (MOBIC) 15 MG tablet Take 1 tablet (15 mg total) by mouth daily. 90 tablet 1   Multiple Minerals-Vitamins (CALCIUM CITRATE PLUS/MAGNESIUM PO)      Multiple Vitamin (MULTIVITAMIN) tablet Take 1 tablet by mouth daily.     naproxen (NAPROSYN) 250 MG tablet Take by mouth daily as needed.     traMADol (ULTRAM) 50 MG tablet Take 1 tablet (50 mg total) by mouth every 6 (six) hours as needed. 30 tablet 0   Vitamin D, Ergocalciferol, (DRISDOL) 1.25 MG (50000 UNIT) CAPS capsule Take 1 capsule (50,000 Units total) by mouth every 7 (seven) days. 5 capsule 0   No current facility-administered medications on file prior to visit.        ROS:  All others reviewed and negative.  Objective        PE:  BP 112/66   Pulse 81   Temp 98.9 F (37.2 C) (Oral)   Ht 5\' 8"  (1.727 m)   Wt 142 lb (64.4 kg)   SpO2 94%   BMI 21.59 kg/m                 Constitutional: Pt appears in NAD               HENT: Head: NCAT.                Right Ear: External ear normal.                 Left Ear: External ear normal.                Eyes: . Pupils are equal, round, and reactive to light. Conjunctivae and EOM are normal               Nose: without d/c or deformity               Neck: Neck supple. Gross normal ROM               Cardiovascular: Normal rate and regular rhythm.                 Pulmonary/Chest: Effort normal and breath sounds without rales or wheezing.                Abd:  Soft, NT, ND, + BS, no organomegaly               Neurological: Pt is alert. At baseline orientation, motor grossly intact               Skin: Skin is warm. No rashes, no other new lesions, LE edema - ***  Psychiatric: Pt behavior is normal without agitation   Micro: none  Cardiac tracings I have personally interpreted today:  none  Pertinent Radiological findings (summarize): none   Lab Results  Component Value Date   WBC 7.7 12/09/2021   HGB 14.1 12/09/2021   HCT 41.8 12/09/2021   PLT 279.0 12/09/2021   GLUCOSE 95  12/09/2021   CHOL 167 12/09/2021   TRIG 132.0 12/09/2021   HDL 79.20 12/09/2021   LDLDIRECT 42.0 11/27/2020   LDLCALC 61 12/09/2021   ALT 48 12/09/2021   AST 42 (H) 12/09/2021   NA 139 12/09/2021   K 4.5 12/09/2021   CL 103 12/09/2021   CREATININE 0.82 12/09/2021   BUN 13 12/09/2021   CO2 29 12/09/2021   TSH 1.87 12/09/2021   PSA 1.33 12/09/2021   HGBA1C 5.9 12/09/2021   Assessment/Plan:  Manuel Cruz is a 77 y.o. White or Caucasian [1] male with  has a past medical history of Allergic rhinitis, Allergy, GERD (gastroesophageal reflux disease), Hyperlipidemia, Osteopenia (2021), Peripheral neuropathy, Sciatica of right side, Sleep apnea, Squamous cell carcinoma in situ (SCCIS) (06/15/2005), Squamous cell carcinoma in situ (SCCIS) (02/01/2012), Squamous cell carcinoma in situ (SCCIS) (04/05/2016), Squamous cell carcinoma in situ (SCCIS) (05/06/2016), Squamous cell skin cancer, earlobe, and Warts, genital.  No problem-specific Assessment & Plan notes found for this encounter.  Followup: No follow-ups on file.  Cathlean Cower, MD 12/14/2022 1:14 PM [facility]

## 2022-12-14 NOTE — Patient Instructions (Signed)
Please call if you change your mind about the Fosamax (or something similar such as Actonel)  Please continue all other medications as before, and refills have been done if requested.  Please have the pharmacy call with any other refills you may need.  Please continue your efforts at being more active, low cholesterol diet, and weight control  Please keep your appointments with your specialists as you may have planned  Please go to the LAB at the blood drawing area for the tests to be done  You will be contacted by phone if any changes need to be made immediately.  Otherwise, you will receive a letter about your results with an explanation, but please check with MyChart first.  Please remember to sign up for MyChart if you have not done so, as this will be important to you in the future with finding out test results, communicating by private email, and scheduling acute appointments online when needed.  Please make an Appointment to return in 6 months, or sooner if needed

## 2022-12-14 NOTE — Patient Instructions (Signed)
Manuel Cruz , Thank you for taking time to come for your Medicare Wellness Visit. I appreciate your ongoing commitment to your health goals. Please review the following plan we discussed and let me know if I can assist you in the future.   These are the goals we discussed:  Goals      Client understands the importance of follow-up with providers by attending scheduled visits        This is a list of the screening recommended for you and due dates:  Health Maintenance  Topic Date Due   COVID-19 Vaccine (5 - 2023-24 season) 12/30/2022*   Flu Shot  04/14/2023   Medicare Annual Wellness Visit  12/14/2023   Colon Cancer Screening  09/11/2027   DTaP/Tdap/Td vaccine (3 - Td or Tdap) 11/08/2028   Pneumonia Vaccine  Completed   Hepatitis C Screening: USPSTF Recommendation to screen - Ages 58-79 yo.  Completed   Zoster (Shingles) Vaccine  Completed   HPV Vaccine  Aged Out  *Topic was postponed. The date shown is not the original due date.    Advanced directives: Yes  Conditions/risks identified: Yes  Next appointment: Follow up in one year for your annual wellness visit.   Preventive Care 76 Years and Older, Male  Preventive care refers to lifestyle choices and visits with your health care provider that can promote health and wellness. What does preventive care include? A yearly physical exam. This is also called an annual well check. Dental exams once or twice a year. Routine eye exams. Ask your health care provider how often you should have your eyes checked. Personal lifestyle choices, including: Daily care of your teeth and gums. Regular physical activity. Eating a healthy diet. Avoiding tobacco and drug use. Limiting alcohol use. Practicing safe sex. Taking low doses of aspirin every day. Taking vitamin and mineral supplements as recommended by your health care provider. What happens during an annual well check? The services and screenings done by your health care provider  during your annual well check will depend on your age, overall health, lifestyle risk factors, and family history of disease. Counseling  Your health care provider may ask you questions about your: Alcohol use. Tobacco use. Drug use. Emotional well-being. Home and relationship well-being. Sexual activity. Eating habits. History of falls. Memory and ability to understand (cognition). Work and work Statistician. Screening  You may have the following tests or measurements: Height, weight, and BMI. Blood pressure. Lipid and cholesterol levels. These may be checked every 5 years, or more frequently if you are over 11 years old. Skin check. Lung cancer screening. You may have this screening every year starting at age 76 if you have a 30-pack-year history of smoking and currently smoke or have quit within the past 15 years. Fecal occult blood test (FOBT) of the stool. You may have this test every year starting at age 76. Flexible sigmoidoscopy or colonoscopy. You may have a sigmoidoscopy every 5 years or a colonoscopy every 10 years starting at age 72. Prostate cancer screening. Recommendations will vary depending on your family history and other risks. Hepatitis C blood test. Hepatitis B blood test. Sexually transmitted disease (STD) testing. Diabetes screening. This is done by checking your blood sugar (glucose) after you have not eaten for a while (fasting). You may have this done every 1-3 years. Abdominal aortic aneurysm (AAA) screening. You may need this if you are a current or former smoker. Osteoporosis. You may be screened starting at age 76 if you  are at high risk. Talk with your health care provider about your test results, treatment options, and if necessary, the need for more tests. Vaccines  Your health care provider may recommend certain vaccines, such as: Influenza vaccine. This is recommended every year. Tetanus, diphtheria, and acellular pertussis (Tdap, Td) vaccine. You  may need a Td booster every 10 years. Zoster vaccine. You may need this after age 76. Pneumococcal 13-valent conjugate (PCV13) vaccine. One dose is recommended after age 76. Pneumococcal polysaccharide (PPSV23) vaccine. One dose is recommended after age 76. Talk to your health care provider about which screenings and vaccines you need and how often you need them. This information is not intended to replace advice given to you by your health care provider. Make sure you discuss any questions you have with your health care provider. Document Released: 09/26/2015 Document Revised: 05/19/2016 Document Reviewed: 07/01/2015 Elsevier Interactive Patient Education  2017 [place] Prevention in the Home Falls can cause injuries. They can happen to people of all ages. There are many things you can do to make your home safe and to help prevent falls. What can I do on the outside of my home? Regularly fix the edges of walkways and driveways and fix any cracks. Remove anything that might make you trip as you walk through a door, such as a raised step or threshold. Trim any bushes or trees on the path to your home. Use bright outdoor lighting. Clear any walking paths of anything that might make someone trip, such as rocks or tools. Regularly check to see if handrails are loose or broken. Make sure that both sides of any steps have handrails. Any raised decks and porches should have guardrails on the edges. Have any leaves, snow, or ice cleared regularly. Use sand or salt on walking paths during winter. Clean up any spills in your garage right away. This includes oil or grease spills. What can I do in the bathroom? Use night lights. Install grab bars by the toilet and in the tub and shower. Do not use towel bars as grab bars. Use non-skid mats or decals in the tub or shower. If you need to sit down in the shower, use a plastic, non-slip stool. Keep the floor dry. Clean up any water that spills  on the floor as soon as it happens. Remove soap buildup in the tub or shower regularly. Attach bath mats securely with double-sided non-slip rug tape. Do not have throw rugs and other things on the floor that can make you trip. What can I do in the bedroom? Use night lights. Make sure that you have a light by your bed that is easy to reach. Do not use any sheets or blankets that are too big for your bed. They should not hang down onto the floor. Have a firm chair that has side arms. You can use this for support while you get dressed. Do not have throw rugs and other things on the floor that can make you trip. What can I do in the kitchen? Clean up any spills right away. Avoid walking on wet floors. Keep items that you use a lot in easy-to-reach places. If you need to reach something above you, use a strong step stool that has a grab bar. Keep electrical cords out of the way. Do not use floor polish or wax that makes floors slippery. If you must use wax, use non-skid floor wax. Do not have throw rugs and other things on the  floor that can make you trip. What can I do with my stairs? Do not leave any items on the stairs. Make sure that there are handrails on both sides of the stairs and use them. Fix handrails that are broken or loose. Make sure that handrails are as long as the stairways. Check any carpeting to make sure that it is firmly attached to the stairs. Fix any carpet that is loose or worn. Avoid having throw rugs at the top or bottom of the stairs. If you do have throw rugs, attach them to the floor with carpet tape. Make sure that you have a light switch at the top of the stairs and the bottom of the stairs. If you do not have them, ask someone to add them for you. What else can I do to help prevent falls? Wear shoes that: Do not have high heels. Have rubber bottoms. Are comfortable and fit you well. Are closed at the toe. Do not wear sandals. If you use a stepladder: Make  sure that it is fully opened. Do not climb a closed stepladder. Make sure that both sides of the stepladder are locked into place. Ask someone to hold it for you, if possible. Clearly mark and make sure that you can see: Any grab bars or handrails. First and last steps. Where the edge of each step is. Use tools that help you move around (mobility aids) if they are needed. These include: Canes. Walkers. Scooters. Crutches. Turn on the lights when you go into a dark area. Replace any light bulbs as soon as they burn out. Set up your furniture so you have a clear path. Avoid moving your furniture around. If any of your floors are uneven, fix them. If there are any pets around you, be aware of where they are. Review your medicines with your doctor. Some medicines can make you feel dizzy. This can increase your chance of falling. Ask your doctor what other things that you can do to help prevent falls. This information is not intended to replace advice given to you by your health care provider. Make sure you discuss any questions you have with your health care provider. Document Released: 06/26/2009 Document Revised: 02/05/2016 Document Reviewed: 10/04/2014 Elsevier Interactive Patient Education  2017 [employer].

## 2022-12-14 NOTE — Progress Notes (Signed)
Subjective:   Manuel Cruz is a 77 y.o. male who presents for Medicare Annual/Subsequent preventive examination.  Review of Systems     Cardiac Risk Factors include: advanced age (>28men, >72 women);dyslipidemia;family history of premature cardiovascular disease;male gender;sedentary lifestyle     Objective:    Today's Vitals   12/14/22 1320  BP: 112/66  Pulse: 81  Temp: 98.9 F (37.2 C)  TempSrc: Oral  SpO2: 94%  Weight: 142 lb (64.4 kg)  Height: 5\' 8"  (1.727 m)  PainSc: 0-No pain   Body mass index is 21.59 kg/m.     12/09/2021   10:53 AM 05/23/2020    1:41 PM 09/28/2019    1:24 PM 05/09/2019    3:49 PM 01/09/2018   10:45 AM 01/04/2017   10:48 AM 09/03/2015    2:35 PM  Advanced Directives  Does Patient Have a Medical Advance Directive? Yes Yes Yes Yes Yes Yes Yes  Type of Advance Directive Living will;Healthcare Power of [place];Living will [place];Living will [place];Living will Living will  Does patient want to make changes to medical advance directive?  No - Patient declined       Copy of [place] in Chart? Yes - validated most recent copy scanned in chart (See row information)   No - copy requested No - copy requested No - copy requested No - copy requested    Current Medications (verified) Outpatient Encounter Medications as of 12/14/2022  Medication Sig   aspirin 81 MG EC tablet Take 1 tablet (81 mg total) by mouth daily.   atorvastatin (LIPITOR) 80 MG tablet TAKE 1 TABLET DAILY   b complex vitamins tablet Take 1 tablet by mouth daily.   calcitonin, salmon, (MIACALCIN/FORTICAL) 200 UNIT/ACT nasal spray Spray one spray into one nostril and alternate nostril every day   Carboxymeth-Glycerin-Polysorb (REFRESH OPTIVE ADVANCED OP) Apply 1 drop to eye daily.   Cholecalciferol (VITAMIN D-3 PO) 2,000 Units.   cyclobenzaprine (FLEXERIL) 5 MG tablet  Take 1 tablet (5 mg total) by mouth 3 (three) times daily as needed for muscle spasms.   gabapentin (NEURONTIN) 300 MG capsule TAKE 2 CAPSULES 3 TIMES A  DAY   ketotifen (ZADITOR) 0.025 % ophthalmic solution Place 1 drop into both eyes 2 (two) times daily as needed.   Loratadine (CLARITIN PO) Take by mouth daily.   Melatonin 3 MG TABS Take 3 mg by mouth at bedtime.   meloxicam (MOBIC) 15 MG tablet Take 1 tablet (15 mg total) by mouth daily.   Multiple Minerals-Vitamins (CALCIUM CITRATE PLUS/MAGNESIUM PO)    Multiple Vitamin (MULTIVITAMIN) tablet Take 1 tablet by mouth daily.   naproxen (NAPROSYN) 250 MG tablet Take by mouth daily as needed.   traMADol (ULTRAM) 50 MG tablet Take 1 tablet (50 mg total) by mouth every 6 (six) hours as needed.   Vitamin D, Ergocalciferol, (DRISDOL) 1.25 MG (50000 UNIT) CAPS capsule Take 1 capsule (50,000 Units total) by mouth every 7 (seven) days.   No facility-administered encounter medications on file as of 12/14/2022.    Allergies (verified) Patient has no known allergies.   History: Past Medical History:  Diagnosis Date   Allergic rhinitis    Allergy    seasonal allergies   GERD (gastroesophageal reflux disease)    on meds   Hyperlipidemia    on meds   Osteopenia 2021   Peripheral neuropathy    Sciatica of right  side    recurrent   Sleep apnea    pt denies   Squamous cell carcinoma in situ (SCCIS) 06/15/2005   Outer Right Rim of Ear (Cx3,5FU)   Squamous cell carcinoma in situ (SCCIS) 02/01/2012   Left Sideburn (tx p bx)   Squamous cell carcinoma in situ (SCCIS) 04/05/2016   Left Forearm(Cx3,5FU), Left Sideburn Sup(Cx3,5FU), and Left Sideburn Inf (Cx3,5FU)   Squamous cell carcinoma in situ (SCCIS) 05/06/2016   Left Side Neck (tx p bx)   Squamous cell skin cancer, earlobe    Warts, genital    Past Surgical History:  Procedure Laterality Date   COLONOSCOPY  08/12/2005   SA-MAC-(good)suprep-TA/tics   LUMBAR DISC SURGERY  2004   LUMBAR [place]  SURGERY  05/2018   for comression fx   MOUTH SURGERY  2019   implants   TONSILLECTOMY     Family History  Problem Relation Age of Onset   Hyperlipidemia Father    Lymphoma Father 66   Bladder Cancer Father 48   Hernia Brother    Lung cancer Mother 16   Brain cancer Mother 38   Colon polyps Neg Hx    Colon cancer Neg Hx    Esophageal cancer Neg Hx    Rectal cancer Neg Hx    Stomach cancer Neg Hx    Social History   Socioeconomic History   Marital status: Single    Spouse name: Not on file   Number of children: 0   Years of education: Not on file   Highest education level: Bachelor's degree (e.g., BA, AB, BS)  Occupational History   Occupation: insurance    Comment: retired  Tobacco Use   Smoking status: Every Day    Packs/day: .25    Types: Cigarettes   Smokeless tobacco: Never  Vaping Use   Vaping Use: Never used  Substance and Sexual Activity   Alcohol use: Yes    Alcohol/week: 20.0 standard drinks of alcohol    Types: 20 Glasses of wine per week   Drug use: No   Sexual activity: Yes  Other Topics Concern   Not on file  Social History Narrative   Lives with sig other   Caffeine- sodas 2 daily    Social Determinants of Health   Financial Resource Strain: Low Risk  (12/14/2022)   Overall Financial Resource Strain (CARDIA)    Difficulty of Paying Living Expenses: Not hard at all  Food Insecurity: No Food Insecurity (12/14/2022)   Hunger Vital Sign    Worried About Running Out of Food in the Last Year: Never true    Ran Out of Food in the Last Year: Never true  Transportation Needs: No Transportation Needs (12/14/2022)   PRAPARE - Hydrologist (Medical): No    Lack of Transportation (Non-Medical): No  Physical Activity: Insufficiently Active (12/14/2022)   Exercise Vital Sign    Days of Exercise per Week: 2 days    Minutes of Exercise per Session: 30 min  Stress: No Stress Concern Present (12/14/2022)   [place]    Feeling of Stress : Not at all  Social Connections: Moderately Integrated (12/14/2022)   Social Connection and Isolation Panel [NHANES]    Frequency of Communication with Friends and Family: More than three times a week    Frequency of Social Gatherings with Friends and Family: More than three times a week    Attends Religious Services: 1 to  4 times per year    Active Member of Clubs or Organizations: No    Attends Music therapist: Not on file    Marital Status: Living with partner    Tobacco Counseling Ready to quit: Not Answered Counseling given: Not Answered   Clinical Intake:  Pre-visit preparation completed: Yes  Pain : No/denies pain Pain Score: 0-No pain     BMI - recorded: 21.59 Nutritional Status: BMI of 19-24  Normal Nutritional Risks: None Diabetes: No  How often do you need to have someone help you when you read instructions, pamphlets, or other written materials from your doctor or pharmacy?: 1 - Never What is the last grade level you completed in school?: HSG  Diabetic? No  Interpreter Needed?: No  Information entered by :: Lisette Abu, LPN.   Activities of Daily Living    12/14/2022    1:30 PM  In your present state of health, do you have any difficulty performing the following activities:  Hearing? 0  Vision? 0  Difficulty concentrating or making decisions? 0  Walking or climbing stairs? 0  Dressing or bathing? 0  Doing errands, shopping? 0  Preparing Food and eating ? N  Using the Toilet? N  In the past six months, have you accidently leaked urine? N  Do you have problems with loss of bowel control? N  Managing your Medications? N  Managing your Finances? N  Housekeeping or managing your Housekeeping? N    Patient Care Team: Biagio Borg, MD as PCP - Cristino Martes, MD (Inactive) as Consulting Physician (Dermatology) Calvert Cantor, MD as Attending  Physician (Ophthalmology) Gerarda Fraction, MD as Referring Physician (Ophthalmology)  Indicate any recent Medical Services you may have received from other than [facility] providers in the past year (date may be approximate).     Assessment:   This is a routine wellness examination for [place].  Hearing/Vision screen No results found.  Dietary issues and exercise activities discussed: Current Exercise Habits: Home exercise routine, Type of exercise: walking, Time (Minutes): 30, Frequency (Times/Week): 2, Weekly Exercise (Minutes/Week): 60, Intensity: Moderate, Exercise limited by: None identified   Goals Addressed             This Visit's Progress    Client understands the importance of follow-up with providers by attending scheduled visits        Depression Screen    12/14/2022    1:24 PM 12/14/2022    1:02 PM 06/14/2022    1:29 PM 06/14/2022    1:10 PM 12/09/2021   10:40 AM 12/09/2021    8:56 AM 09/29/2021    1:28 PM  PHQ 2/9 Scores  PHQ - 2 Score 0 0 1  0 0 0  PHQ- 9 Score 1 1       Exception Documentation    Patient refusal       Fall Risk    12/14/2022    1:27 PM 12/14/2022    1:02 PM 06/14/2022    1:10 PM 12/09/2021    8:56 AM 09/29/2021    1:28 PM  Fall Risk   Falls in the past year? 0 1 1 1 1   Number falls in past yr: 0 0 1 0 0  Injury with Fall? 0 0 0 1 0  Risk for fall due to : Impaired balance/gait Impaired balance/gait Other (Comment)    Follow up Falls prevention discussed Falls evaluation completed;Education provided Falls evaluation completed      [place]  PERTAINING TO THE HOME:  Any stairs in or around the home? Yes  If so, are there any without handrails? Yes  [place] room Home free of loose throw rugs in walkways, pet beds, electrical cords, etc? Yes  Adequate lighting in your home to reduce risk of falls? Yes   ASSISTIVE DEVICES UTILIZED TO PREVENT FALLS:  Life alert? No  Use of a cane, walker or w/c? No  Grab bars in the bathroom? Yes   Shower chair or bench in shower? No  Elevated toilet seat or a handicapped toilet? Yes   TIMED UP AND GO:  Was the test performed? Yes .  Length of time to ambulate 10 feet: 8 sec.   Gait steady and fast without use of assistive device  Cognitive Function:        12/14/2022    1:27 PM  6CIT Screen  What Year? 0 points  What month? 0 points  What time? 0 points  Count back from 20 0 points  Months in reverse 0 points  Repeat phrase 0 points  Total Score 0 points    Immunizations Immunization History  Administered Date(s) Administered   Fluad Quad(high Dose 65+) 06/02/2020, 06/05/2021   H1N1 08/13/2008   Influenza Whole 06/13/2008   Influenza, High Dose Seasonal PF 06/08/2018, 04/30/2019, 04/30/2019, 06/07/2022   Influenza,inj,Quad PF,6+ Mos 06/11/2014   Influenza-Unspecified 04/25/2019   PFIZER Comirnaty(Gray Top)Covid-19 Tri-Sucrose Vaccine 12/30/2020   PFIZER(Purple Top)SARS-COV-2 Vaccination 10/08/2019, 10/29/2019   Pfizer Covid-19 Vaccine Bivalent Booster 62yrs & up 08/08/2021   Pneumococcal Conjugate-13 03/15/2013, 04/10/2014   Pneumococcal Polysaccharide-23 04/17/2015   Respiratory Syncytial Virus Vaccine,Recomb Aduvanted(Arexvy) 08/07/2022   Td 10/24/2008   Tdap 11/08/2018   Zoster Recombinat (Shingrix) 06/02/2017, 11/27/2017   Zoster, Live 01/19/2010    TDAP status: Up to date  Flu Vaccine status: Up to date  Pneumococcal vaccine status: Up to date  Covid-19 vaccine status: Completed vaccines  Qualifies for Shingles Vaccine? Yes   Zostavax completed Yes   Shingrix Completed?: Yes  Screening Tests Health Maintenance  Topic Date Due   COVID-19 Vaccine (5 - 2023-24 season) 12/30/2022 (Originally 05/14/2022)   INFLUENZA VACCINE  04/14/2023   Medicare Annual Wellness (AWV)  12/14/2023   COLONOSCOPY (Pts 45-18yrs Insurance coverage will need to be confirmed)  09/11/2027   DTaP/Tdap/Td (3 - Td or Tdap) 11/08/2028   Pneumonia Vaccine 69+ Years old   Completed   Hepatitis C Screening  Completed   Zoster Vaccines- Shingrix  Completed   HPV VACCINES  Aged Out    Health Maintenance  There are no preventive care reminders to display for this patient.  Colorectal cancer screening: Type of screening: Colonoscopy. Completed 09/10/2020. Repeat every 7 years  Lung Cancer Screening: (Low Dose CT Chest recommended if Age 38-80 years, 30 pack-year currently smoking OR have quit w/in 15years.) does not qualify.   Lung Cancer Screening Referral: no  Additional Screening:  Hepatitis C Screening: does qualify; Completed 04/23/2016  Vision Screening: Recommended annual ophthalmology exams for early detection of glaucoma and other disorders of the eye. Is the patient up to date with their annual eye exam?  Yes  Who is the provider or what is the name of the office in which the patient attends annual eye exams? [facility] and Gerarda Fraction, MD (Macular Degeneration Specialist) If pt is not established with a provider, would they like to be referred to a provider to establish care? No .   Dental Screening: Recommended annual dental exams  for proper oral hygiene  Community Resource Referral / Chronic Care Management: CRR required this visit?  No   CCM required this visit?  No      Plan:     I have personally reviewed and noted the following in the patient's chart:   Medical and social history Use of alcohol, tobacco or illicit drugs  Current medications and supplements including opioid prescriptions. Patient is not currently taking opioid prescriptions. Functional ability and status Nutritional status Physical activity Advanced directives List of other physicians Hospitalizations, surgeries, and ER visits in previous 12 months Vitals Screenings to include cognitive, depression, and falls Referrals and appointments  In addition, I have reviewed and discussed with patient certain preventive protocols, quality metrics, and  best practice recommendations. A written personalized care plan for preventive services as well as general preventive health recommendations were provided to patient.     Sheral Flow, LPN   [id number]   Nurse Notes:  Normal cognitive status assessed by direct observation by this Nurse Health Advisor. No abnormalities found.   Patient Medicare AWV questionnaire was completed by the patient on 12/14/2022; I have confirmed that all information answered by patient is correct and no changes since this date.

## 2022-12-15 ENCOUNTER — Encounter: Payer: Self-pay | Admitting: Internal Medicine

## 2022-12-15 LAB — URINALYSIS, ROUTINE W REFLEX MICROSCOPIC
Bilirubin Urine: NEGATIVE
Hgb urine dipstick: NEGATIVE
Ketones, ur: NEGATIVE
Leukocytes,Ua: NEGATIVE
Nitrite: NEGATIVE
Specific Gravity, Urine: >=1.03 — AB (ref 1.000–1.030)
Total Protein, Urine: NEGATIVE
Urine Glucose: NEGATIVE
Urobilinogen, UA: 0.2 (ref 0.0–1.0)
pH: 6 (ref 5.0–8.0)

## 2022-12-15 NOTE — Assessment & Plan Note (Signed)
Last vitamin D Lab Results  Component Value Date   VD25OH 54.26 12/14/2022   Stable, cont oral replacement

## 2022-12-15 NOTE — Assessment & Plan Note (Signed)
Pt still smoking, not ready to quit

## 2022-12-15 NOTE — Assessment & Plan Note (Signed)
D/w pt, declines fosamax, to continue miacalcin

## 2022-12-15 NOTE — Assessment & Plan Note (Signed)
Lab Results  Component Value Date   HGBA1C 5.8 12/14/2022   Stable, pt to continue current medical treatment - diet, wt control

## 2022-12-15 NOTE — Assessment & Plan Note (Signed)
Lab Results  Component Value Date   LDLCALC 42 12/14/2022   Stable, pt to continue current statin lipitor 80 mg qd

## 2022-12-16 LAB — PTH, INTACT AND CALCIUM
Calcium: 9.3 mg/dL (ref 8.6–10.3)
PTH: 45 pg/mL (ref 16–77)

## 2023-03-10 ENCOUNTER — Telehealth: Payer: Self-pay | Admitting: Internal Medicine

## 2023-03-10 NOTE — Telephone Encounter (Signed)
Disability paperwork dropped off by patient and placed in provider box

## 2023-04-07 NOTE — Progress Notes (Unsigned)
Name: Manuel Cruz  MRN/ DOB: [id number], 07/26/1946    Age/ Sex: 77 y.o., male    PCP: Corwin Levins, MD   Reason for Endocrinology Evaluation: Osteoporosis     Date of Initial Endocrinology Evaluation: 04/07/2023     HPI: [name] is a 77 y.o. male with a past medical history of dyslipidemia and osteoporosis. The patient presented for initial endocrinology clinic visit on 04/07/2023 for consultative assistance with his osteoporosis.   Pt was diagnosed with osteoporosis:  Fracture Hx: L3 compression fracture (2019) while trying to lift someone, T11 compression fracture (2023) FH of osteoporosis or hip fracture: Mother  Prior Hx of anti-resorptive therapy : calcitonin   In reviewing his chart, the patient has been noted with normal vitamin D, TFTs and testosterone 2023  MVI  Ergocalciferol 50,000 weekly  Tobacco- yes  Alcohol- yes a few drinks a week  Calcitonin 1 nasal spray     Continues with occasional back pain at the compression fracture site No hx of glucocorticoids No recent falls  Has noted occasional Constipation for the past 2 weeks but no  diarrhea  Denies abdominal sx  No Prior XRT  No hx of cancer  NO CVA of CAD     HISTORY:  Past Medical History:  Past Medical History:  Diagnosis Date   Allergic rhinitis    Allergy    seasonal allergies   GERD (gastroesophageal reflux disease)    on meds   Hyperlipidemia    on meds   Osteopenia 2021   Peripheral neuropathy    Sciatica of right side    recurrent   Sleep apnea    pt denies   Squamous cell carcinoma in situ (SCCIS) 06/15/2005   Outer Right Rim of Ear (Cx3,5FU)   Squamous cell carcinoma in situ (SCCIS) 02/01/2012   Left Sideburn (tx p bx)   Squamous cell carcinoma in situ (SCCIS) 04/05/2016   Left Forearm(Cx3,5FU), Left Sideburn Sup(Cx3,5FU), and Left Sideburn Inf (Cx3,5FU)   Squamous cell carcinoma in situ (SCCIS) 05/06/2016   Left Side Neck (tx p bx)   Squamous cell skin  cancer, earlobe    Warts, genital    Past Surgical History:  Past Surgical History:  Procedure Laterality Date   COLONOSCOPY  08/12/2005   SA-MAC-(good)suprep-TA/tics   LUMBAR DISC SURGERY  2004   LUMBAR DISC SURGERY  05/2018   for comression fx   MOUTH SURGERY  2019   implants   TONSILLECTOMY      Social History:  reports that he has been smoking cigarettes. He has never used smokeless tobacco. He reports current alcohol use of about 20.0 standard drinks of alcohol per week. He reports that he does not use drugs. Family History: family history includes Bladder Cancer (age of onset: 71) in his father; Brain cancer (age of onset: 78) in his mother; Hernia in his brother; Hyperlipidemia in his father; Lung cancer (age of onset: 22) in his mother; Lymphoma (age of onset: 10) in his father.   HOME MEDICATIONS: Allergies as of 04/08/2023   No Known Allergies      Medication List        Accurate as of April 07, 2023  3:29 PM. If you have any questions, ask your nurse or doctor.          aspirin EC 81 MG tablet Take 1 tablet (81 mg total) by mouth daily.   atorvastatin 80 MG tablet Commonly known as: LIPITOR TAKE 1  TABLET DAILY   b complex vitamins tablet Take 1 tablet by mouth daily.   calcitonin (salmon) 200 UNIT/ACT nasal spray Commonly known as: MIACALCIN/FORTICAL Spray one spray into one nostril and alternate nostril every day   CALCIUM CITRATE PLUS/MAGNESIUM PO   CLARITIN PO Take by mouth daily.   cyclobenzaprine 5 MG tablet Commonly known as: FLEXERIL Take 1 tablet (5 mg total) by mouth 3 (three) times daily as needed for muscle spasms.   gabapentin 300 MG capsule Commonly known as: NEURONTIN TAKE 2 CAPSULES 3 TIMES A  DAY   ketotifen 0.025 % ophthalmic solution Commonly known as: ZADITOR Place 1 drop into both eyes 2 (two) times daily as needed.   melatonin 3 MG Tabs tablet Take 3 mg by mouth at bedtime.   meloxicam 15 MG tablet Commonly known as:  MOBIC Take 1 tablet (15 mg total) by mouth daily.   multivitamin tablet Take 1 tablet by mouth daily.   naproxen 250 MG tablet Commonly known as: NAPROSYN Take by mouth daily as needed.   REFRESH OPTIVE ADVANCED OP Apply 1 drop to eye daily.   traMADol 50 MG tablet Commonly known as: ULTRAM Take 1 tablet (50 mg total) by mouth every 6 (six) hours as needed.   Vitamin D (Ergocalciferol) 1.25 MG (50000 UNIT) Caps capsule Commonly known as: DRISDOL Take 1 capsule (50,000 Units total) by mouth every 7 (seven) days.   VITAMIN D-3 PO 2,000 Units.          REVIEW OF SYSTEMS: A comprehensive ROS was conducted with the patient and is negative except as per HPI and below:  ROS     OBJECTIVE:  VS: There were no vitals taken for this visit.   Wt Readings from Last 3 Encounters:  12/14/22 142 lb (64.4 kg)  12/14/22 142 lb (64.4 kg)  11/23/22 143 lb (64.9 kg)     EXAM: General: Pt appears well and is in NAD  Eyes: External eye exam normal without stare, lid lag or exophthalmos.  EOM intact.  PERRL.  Neck: General: Supple without adenopathy. Thyroid: Thyroid size normal.  No goiter or nodules appreciated. No thyroid bruit.  Lungs: Clear with good BS bilat   Heart: Auscultation: RRR.  Abdomen: Soft, nontender  Extremities:  BL LE: No pretibial edema   Mental Status: Judgment, insight: Intact Orientation: Oriented to time, place, and person Mood and affect: No depression, anxiety, or agitation     DATA REVIEWED: ***   Old records , labs and images have been reviewed.   ASSESSMENT/PLAN/RECOMMENDATIONS:   Osteoporosis  -Patient with clinical diagnosis of osteoporosis due to compression fractures x 2 -Emphasized the importance of optimizing calcium and vitamin D intake -Emphasized the importance of tobacco and alcohol cessation if possible as they do have negative impact on BMD -Given compression fractures I have recommended PTH analogs, but patient is not keen on  taking daily injections -After some discussion, the patient agreed to proceed with Evenity.  He understand this will be monthly injections at the office for 12 months.  Patient concerned about osteonecrosis, we discussed that this is a very low risk and the benefit outweighs the risk.  -Labs today****  Medications : Stop calcitonin Start Evenity 210 mg monthly Continue MVI daily Start calcium carbonate 500 mg daily Continue ergocalciferol 50,000 weekly   Follow-up in 6 months  Signed electronically by: Lyndle Herrlich, MD  [facility] Endocrinology  [facility] [address], [address] [place], [place] [address]  Phone: [phone] FAX: [phone]   CC: Corwin Levins, MD [address] [place] [address] Phone: [phone] Fax: [phone]   Return to Endocrinology clinic as below: Future Appointments  Date Time Provider Department Center  04/08/2023  9:30 AM Teela Narducci, Konrad Dolores, MD [id number] None

## 2023-04-08 ENCOUNTER — Ambulatory Visit: Payer: Medicare Other | Admitting: Internal Medicine

## 2023-04-08 ENCOUNTER — Telehealth: Payer: Self-pay | Admitting: Internal Medicine

## 2023-04-08 ENCOUNTER — Encounter: Payer: Self-pay | Admitting: Internal Medicine

## 2023-04-08 VITALS — BP 116/74 | HR 71 | Ht 68.0 in | Wt 142.0 lb

## 2023-04-08 DIAGNOSIS — M8000XS Age-related osteoporosis with current pathological fracture, unspecified site, sequela: Secondary | ICD-10-CM

## 2023-04-08 LAB — COMPREHENSIVE METABOLIC PANEL
ALT: 52 U/L (ref 0–53)
AST: 47 U/L — ABNORMAL HIGH (ref 0–37)
Albumin: 4.3 g/dL (ref 3.5–5.2)
Alkaline Phosphatase: 76 U/L (ref 39–117)
BUN: 10 mg/dL (ref 6–23)
CO2: 28 mEq/L (ref 19–32)
Calcium: 9.5 mg/dL (ref 8.4–10.5)
Chloride: 104 mEq/L (ref 96–112)
Creatinine, Ser: 0.86 mg/dL (ref 0.40–1.50)
GFR: 84.59 mL/min (ref >60.00)
Glucose, Bld: 97 mg/dL (ref 70–99)
Potassium: 3.8 mEq/L (ref 3.5–5.1)
Sodium: 141 mEq/L (ref 135–145)
Total Bilirubin: 0.5 mg/dL (ref 0.2–1.2)
Total Protein: 7.3 g/dL (ref 6.0–8.3)

## 2023-04-08 LAB — PHOSPHORUS: Phosphorus: 3.6 mg/dL (ref 2.3–4.6)

## 2023-04-08 LAB — VITAMIN D 25 HYDROXY (VIT D DEFICIENCY, FRACTURES): VITD: 60.16 ng/mL (ref 30.00–100.00)

## 2023-04-08 LAB — MAGNESIUM: Magnesium: 1.6 mg/dL (ref 1.5–2.5)

## 2023-04-08 NOTE — Patient Instructions (Signed)
Take calcium 1200 mg daily  Vitamin D 50,000 weekly ( ergocalciferol)

## 2023-04-08 NOTE — Telephone Encounter (Signed)
Can you please add him to the Evenity list ?  Pt with OP and compression fracture     Thank you

## 2023-04-11 ENCOUNTER — Other Ambulatory Visit: Payer: Self-pay

## 2023-04-11 MED ORDER — VITAMIN D (ERGOCALCIFEROL) 1.25 MG (50000 UNIT) PO CAPS: 50000.0000 [IU] | ORAL_CAPSULE | ORAL | 0 refills | Status: DC

## 2023-04-11 NOTE — Addendum Note (Signed)
Addended by: Barnet Glasgow on: 04/11/2023 03:27 PM   Modules accepted: Orders

## 2023-04-12 ENCOUNTER — Other Ambulatory Visit: Payer: Medicare Other

## 2023-04-12 DIAGNOSIS — M8000XS Age-related osteoporosis with current pathological fracture, unspecified site, sequela: Secondary | ICD-10-CM

## 2023-04-19 ENCOUNTER — Encounter: Payer: Self-pay | Admitting: Internal Medicine

## 2023-04-19 NOTE — Telephone Encounter (Addendum)
Attempted to call the pt twice on 04/19/2023 . The voice mail has someone else listed, left a message to call back    Patient will not qualify for Evenity, he declined PTH analogs due to daily injections, the other option is Prolia  A portal message will be sent to the patient

## 2023-04-20 NOTE — Telephone Encounter (Signed)
Prolia VOB initiated via [email or web address]     HPI: [name] is a 77 y.o. male with a past medical history of dyslipidemia and osteoporosis. The patient presented for initial endocrinology clinic visit on 04/07/2023 for consultative assistance with his osteoporosis.    Pt was diagnosed with osteoporosis:   Fracture Hx: L3 compression fracture (2019) while trying to lift someone, T11 compression fracture (2023) FH of osteoporosis or hip fracture: Mother  Prior Hx of anti-resorptive therapy : calcitonin     In reviewing his chart, the patient has been noted with normal vitamin D, TFTs and testosterone 2023   MVI  Ergocalciferol 50,000 weekly  Tobacco- yes  Alcohol- yes a few drinks a week  Calcitonin 1 nasal spray        Continues with occasional back pain at the compression fracture site No hx of glucocorticoids No recent falls  Has noted occasional Constipation for the past 2 weeks but no  diarrhea  Denies abdominal sx  No Prior XRT  No hx of cancer  NO CVA of CAD      ASSESSMENT/PLAN/RECOMMENDATIONS:    Osteoporosis   -Patient with clinical diagnosis of osteoporosis due to compression fractures x 2 -Emphasized the importance of optimizing calcium and vitamin D intake -Emphasized the importance of tobacco and alcohol cessation if possible as they do have negative impact on BMD -Given compression fractures I have recommended PTH analogs, but patient is not keen on taking daily injections -After some discussion, the patient agreed to proceed with Evenity.  He understand this will be monthly injections at the office for 12 months.  Patient concerned about osteonecrosis, we discussed that this is a very low risk and the benefit outweighs the risk.   -Labs today show normal PTH, vitamin D, GFR, magnesium and phosphorus   Medications : Stop calcitonin Start Evenity 210 mg monthly Continue MVI daily Start calcium carbonate 500 mg daily Continue ergocalciferol  50,000 weekly     Follow-up in 6 months

## 2023-04-30 NOTE — Telephone Encounter (Signed)
No Prior Auth required for [facility]

## 2023-04-30 NOTE — Telephone Encounter (Signed)
Pt ready for scheduling on or after 05/02/23  Out-of-pocket cost due at time of visit: $0  Primary: Medicare Prolia co-insurance: 20% (approximately $320) Admin fee co-insurance: 20% (approximately $25)  Deductible: $240 of $240 met  Secondary: BCBS [place] Medicare Supplement Plan [place] Prolia co-insurance: Covers Medicare Part B co-insurance Admin fee co-insurance: Covers Medicare Part B co-insurance  Deductible:  Covered by secondary  Prior Auth: NOT required PA# Valid:   ** This summary of benefits is an estimation of the patient's out-of-pocket cost. Exact cost may vary based on individual plan coverage.

## 2023-05-21 ENCOUNTER — Other Ambulatory Visit: Payer: Self-pay | Admitting: Internal Medicine

## 2023-05-24 ENCOUNTER — Ambulatory Visit (INDEPENDENT_AMBULATORY_CARE_PROVIDER_SITE_OTHER): Payer: Medicare Other

## 2023-05-24 VITALS — BP 114/70 | HR 75 | Ht 68.0 in | Wt 142.0 lb

## 2023-05-24 DIAGNOSIS — M8000XS Age-related osteoporosis with current pathological fracture, unspecified site, sequela: Secondary | ICD-10-CM | POA: Diagnosis not present

## 2023-05-24 MED ORDER — DENOSUMAB 60 MG/ML ~~LOC~~ SOSY
60.0000 mg | PREFILLED_SYRINGE | Freq: Once | SUBCUTANEOUS | Status: AC
Start: 2023-05-24 — End: 2023-05-24
  Administered 2023-05-24: 60 mg via SUBCUTANEOUS

## 2023-05-24 NOTE — Progress Notes (Signed)
After obtaining consent, and per orders of Dr. Nani Ravens, injection of Prolia 60mg   given by Keylie Beavers L Merina Behrendt in left arm SQ. Patient instructed to remain in clinic for 20 minutes afterwards, and to report any adverse reaction to me immediately.

## 2023-06-04 NOTE — Telephone Encounter (Signed)
Pt received Prolia inj 05/24/23. Next Prolia inj due 11/22/23

## 2023-08-01 ENCOUNTER — Other Ambulatory Visit: Payer: Self-pay | Admitting: Internal Medicine

## 2023-08-01 ENCOUNTER — Other Ambulatory Visit: Payer: Self-pay

## 2023-08-17 ENCOUNTER — Other Ambulatory Visit: Payer: Self-pay | Admitting: Internal Medicine

## 2023-09-09 ENCOUNTER — Other Ambulatory Visit: Payer: Self-pay

## 2023-09-09 ENCOUNTER — Other Ambulatory Visit: Payer: Self-pay | Admitting: Internal Medicine

## 2023-09-26 ENCOUNTER — Telehealth: Payer: Self-pay

## 2023-09-26 NOTE — Telephone Encounter (Signed)
 Appt has been made for 09/27/23.

## 2023-09-26 NOTE — Telephone Encounter (Signed)
 Copied from CRM [id number]. Topic: Clinical - Medical Advice >> Sep 26, 2023  9:42 AM Leila BROCKS wrote: Reason for CRM: Patient [id number] states coughing phelgm white no blood, nasal and chest congestion, ran out of mucinex, Covid test is negative. Patient denies no fever, headaches, or pain. Patient does not want to come in to be seen and wants medication to [id number] - [place], [place] - [address] AT CORNER OF [facility] [address] [place] [address] [phone]

## 2023-09-27 ENCOUNTER — Encounter: Payer: Self-pay | Admitting: Internal Medicine

## 2023-09-27 ENCOUNTER — Ambulatory Visit (INDEPENDENT_AMBULATORY_CARE_PROVIDER_SITE_OTHER): Payer: Medicare Other | Admitting: Internal Medicine

## 2023-09-27 VITALS — BP 126/64 | HR 78 | Temp 98.5°F | Ht 68.0 in | Wt 144.0 lb

## 2023-09-27 DIAGNOSIS — J069 Acute upper respiratory infection, unspecified: Secondary | ICD-10-CM | POA: Diagnosis not present

## 2023-09-27 MED ORDER — AZITHROMYCIN 250 MG PO TABS: ORAL_TABLET | ORAL | 0 refills | Status: AC

## 2023-09-27 MED ORDER — HYDROCODONE BIT-HOMATROP MBR 5-1.5 MG/5ML PO SOLN: 5.0000 mL | Freq: Three times a day (TID) | ORAL | 0 refills | Status: DC | PRN

## 2023-09-27 NOTE — Patient Instructions (Addendum)
        Medications changes include :   zpak, hycodan cough syrup      Return if symptoms worsen or fail to improve.

## 2023-09-27 NOTE — Progress Notes (Signed)
 Subjective:    Patient ID: Manuel Cruz, male    DOB: [birth year], 78 y.o.   MRN: [id number]      HPI Manuel Cruz is here for  Chief Complaint  Patient presents with   Cough    Cough, congestion, nasal drainage, some body aches (patient has taken Mucinex); Symptoms started on Friday (Neg COVID test yesterday)     He is here for an acute visit for cold symptoms.  His symptoms started Friday - 4 days ago  He is experiencing nasal congestion, sinus pressure, sore throat, cough that is productive.  Mucus is thick, but mostly clear.  He has has mild bodyaches.  He denies any obvious fevers or chills.  He is still smoking.  He denies any chest tightness or shortness of breath.  He has tried taking mucinex, advil, cough syrup   Covid test neg yesterday   Medications and allergies reviewed with patient and updated if appropriate.  Current Outpatient Medications on File Prior to Visit  Medication Sig Dispense Refill   aspirin  81 MG EC tablet Take 1 tablet (81 mg total) by mouth daily. 90 tablet 1   atorvastatin  (LIPITOR) 80 MG tablet TAKE 1 TABLET DAILY 90 tablet 2   b complex vitamins tablet Take 1 tablet by mouth daily.     calcitonin, salmon, (MIACALCIN/FORTICAL) 200 UNIT/ACT nasal spray Spray one spray into one nostril and alternate nostril every day 3.7 mL 11   Carboxymeth-Glycerin-Polysorb (REFRESH OPTIVE ADVANCED OP) Apply 1 drop to eye daily.     Cholecalciferol (VITAMIN D -3 PO) 2,000 Units.     cyclobenzaprine  (FLEXERIL ) 5 MG tablet Take 1 tablet (5 mg total) by mouth 3 (three) times daily as needed for muscle spasms. 40 tablet 2   gabapentin  (NEURONTIN ) 300 MG capsule TAKE 2 CAPSULES 3 TIMES A  DAY 540 capsule 1   ketotifen (ZADITOR) 0.025 % ophthalmic solution Place 1 drop into both eyes 2 (two) times daily as needed.     Loratadine (CLARITIN PO) Take by mouth daily.     Melatonin 3 MG TABS Take 3 mg by mouth at bedtime.     meloxicam  (MOBIC ) 15 MG tablet Take 1 tablet (15  mg total) by mouth daily. 90 tablet 1   Multiple Minerals-Vitamins (CALCIUM  CITRATE PLUS/MAGNESIUM PO)      Multiple Vitamin (MULTIVITAMIN) tablet Take 1 tablet by mouth daily.     naproxen (NAPROSYN) 250 MG tablet Take by mouth daily as needed.     traMADol  (ULTRAM ) 50 MG tablet Take 1 tablet (50 mg total) by mouth every 6 (six) hours as needed. 30 tablet 0   Vitamin D , Ergocalciferol , (DRISDOL ) 1.25 MG (50000 UNIT) CAPS capsule TAKE 1 CAPSULE (50,000 UNITS TOTAL) BY MOUTH EVERY 7 (SEVEN) DAYS 12 capsule 0   No current facility-administered medications on file prior to visit.    Review of Systems  Constitutional:  Negative for appetite change, chills and fever.  HENT:  Positive for congestion, sinus pressure (some) and sore throat (one day). Negative for ear pain.   Respiratory:  Positive for cough (productive). Negative for chest tightness, shortness of breath and wheezing.   Gastrointestinal:  Negative for diarrhea and nausea.  Musculoskeletal:  Positive for myalgias (mild initially).  Neurological:  Negative for dizziness, light-headedness and headaches.       Objective:   Vitals:   09/27/23 1528  BP: 126/64  Pulse: 78  Temp: 98.5 F (36.9 C)  SpO2: 96%   BP Readings from  Last 3 Encounters:  09/27/23 126/64  05/24/23 114/70  04/08/23 116/74   Wt Readings from Last 3 Encounters:  09/27/23 144 lb (65.3 kg)  05/24/23 142 lb (64.4 kg)  04/08/23 142 lb (64.4 kg)   Body mass index is 21.9 kg/m.    Physical Exam Constitutional:      General: He is not in acute distress.    Appearance: Normal appearance. He is not ill-appearing.  HENT:     Head: Normocephalic.     Right Ear: Tympanic membrane, ear canal and external ear normal. There is no impacted cerumen.     Left Ear: Tympanic membrane, ear canal and external ear normal. There is no impacted cerumen.     Mouth/Throat:     Mouth: Mucous membranes are moist.     Pharynx: No oropharyngeal exudate or posterior  oropharyngeal erythema.  Eyes:     Conjunctiva/sclera: Conjunctivae normal.  Cardiovascular:     Rate and Rhythm: Normal rate and regular rhythm.  Pulmonary:     Effort: Pulmonary effort is normal. No respiratory distress.     Breath sounds: Normal breath sounds. No wheezing or rales.  Musculoskeletal:     Cervical back: Neck supple. No tenderness.  Lymphadenopathy:     Cervical: No cervical adenopathy.  Skin:    General: Skin is warm and dry.     Findings: No rash.  Neurological:     Mental Status: He is alert.            Assessment & Plan:    URI: Acute He is an active smoker Difficulty sleeping-will prescribe Hycodan cough syrup to use at night Encourage smoking cessation Concern for developing bacterial infection Start Z-Pak which has been effective for her in the past Can continue Mucinex and other over-the-counter cold medications for symptom relief Call or return if no improvement

## 2023-10-11 ENCOUNTER — Ambulatory Visit (INDEPENDENT_AMBULATORY_CARE_PROVIDER_SITE_OTHER): Payer: Medicare Other | Admitting: Internal Medicine

## 2023-10-11 ENCOUNTER — Encounter: Payer: Self-pay | Admitting: Internal Medicine

## 2023-10-11 VITALS — BP 120/80 | HR 70 | Ht 68.0 in | Wt 148.0 lb

## 2023-10-11 DIAGNOSIS — M8000XS Age-related osteoporosis with current pathological fracture, unspecified site, sequela: Secondary | ICD-10-CM

## 2023-10-11 DIAGNOSIS — M810 Age-related osteoporosis without current pathological fracture: Secondary | ICD-10-CM

## 2023-10-11 NOTE — Progress Notes (Unsigned)
Name: Manuel Cruz  MRN/ DOB: [id number], March 02, 1946    Age/ Sex: 78 y.o., male    PCP: Corwin Levins, MD   Reason for Endocrinology Evaluation: Osteoporosis     Date of Initial Endocrinology Evaluation: 04/08/2023    HPI: [name] is a 78 y.o. male with a past medical history of dyslipidemia and osteoporosis. The patient presented for initial endocrinology clinic visit on 04/08/2023 for consultative assistance with his osteoporosis.   Pt was diagnosed with osteoporosis:  Fracture Hx: L3 compression fracture (2019) while trying to lift someone, T11 compression fracture (2023) FH of osteoporosis or hip fracture: Mother  Prior Hx of anti-resorptive therapy : calcitonin discontinued 05/2023.    In reviewing his chart, the patient has been noted with normal vitamin D, TFTs and testosterone 2023   No hx of glucocorticoids No Prior XRT  No hx of cancer  NO CVA of CAD    Patient did not qualify for Evenity.  He declined PTH analogs due to daily injections  Received first Prolia injection 05/24/2023  SUBJECTIVE:    Today (10/11/23):  Manuel Cruz is here for follow-up on osteoporosis management.   Denies rash with prolia injection  Denies recent falls  Denies bone fractures Has occasional changes in bowel movement  Denies nausea or vomiting  Has occasional back pain , tolerable He has not been consistent with weightbearing exercises   MVI  Calcium carbonate 500 mg daily  Ergocalciferol 50,000 weekly        HISTORY:  Past Medical History:  Past Medical History:  Diagnosis Date   Allergic rhinitis    Allergy    seasonal allergies   GERD (gastroesophageal reflux disease)    on meds   Hyperlipidemia    on meds   Osteopenia 2021   Peripheral neuropathy    Sciatica of right side    recurrent   Sleep apnea    pt denies   Squamous cell carcinoma in situ (SCCIS) 06/15/2005   Outer Right Rim of Ear (Cx3,5FU)   Squamous cell carcinoma in situ  (SCCIS) 02/01/2012   Left Sideburn (tx p bx)   Squamous cell carcinoma in situ (SCCIS) 04/05/2016   Left Forearm(Cx3,5FU), Left Sideburn Sup(Cx3,5FU), and Left Sideburn Inf (Cx3,5FU)   Squamous cell carcinoma in situ (SCCIS) 05/06/2016   Left Side Neck (tx p bx)   Squamous cell skin cancer, earlobe    Warts, genital    Past Surgical History:  Past Surgical History:  Procedure Laterality Date   COLONOSCOPY  08/12/2005   SA-MAC-(good)suprep-TA/tics   LUMBAR DISC SURGERY  2004   LUMBAR DISC SURGERY  05/2018   for comression fx   MOUTH SURGERY  2019   implants   TONSILLECTOMY      Social History:  reports that he has been smoking cigarettes. He has never used smokeless tobacco. He reports current alcohol use of about 20.0 standard drinks of alcohol per week. He reports that he does not use drugs. Family History: family history includes Bladder Cancer (age of onset: 74) in his father; Brain cancer (age of onset: 21) in his mother; Hernia in his brother; Hyperlipidemia in his father; Lung cancer (age of onset: 36) in his mother; Lymphoma (age of onset: 32) in his father.   HOME MEDICATIONS: Allergies as of 10/11/2023   No Known Allergies      Medication List        Accurate as of October 11, 2023  9:57 AM.  If you have any questions, ask your nurse or doctor.          aspirin EC 81 MG tablet Take 1 tablet (81 mg total) by mouth daily.   atorvastatin 80 MG tablet Commonly known as: LIPITOR TAKE 1 TABLET DAILY   b complex vitamins tablet Take 1 tablet by mouth daily.   calcitonin (salmon) 200 UNIT/ACT nasal spray Commonly known as: MIACALCIN/FORTICAL Spray one spray into one nostril and alternate nostril every day   CALCIUM CITRATE PLUS/MAGNESIUM PO   CLARITIN PO Take by mouth daily.   cyclobenzaprine 5 MG tablet Commonly known as: FLEXERIL Take 1 tablet (5 mg total) by mouth 3 (three) times daily as needed for muscle spasms.   gabapentin 300 MG capsule Commonly  known as: NEURONTIN TAKE 2 CAPSULES 3 TIMES A  DAY   HYDROcodone bit-homatropine 5-1.5 MG/5ML syrup Commonly known as: HYCODAN Take 5 mLs by mouth every 8 (eight) hours as needed for cough.   ketotifen 0.025 % ophthalmic solution Commonly known as: ZADITOR Place 1 drop into both eyes 2 (two) times daily as needed.   melatonin 3 MG Tabs tablet Take 3 mg by mouth at bedtime.   meloxicam 15 MG tablet Commonly known as: MOBIC Take 1 tablet (15 mg total) by mouth daily.   multivitamin tablet Take 1 tablet by mouth daily.   naproxen 250 MG tablet Commonly known as: NAPROSYN Take by mouth daily as needed.   REFRESH OPTIVE ADVANCED OP Apply 1 drop to eye daily.   traMADol 50 MG tablet Commonly known as: ULTRAM Take 1 tablet (50 mg total) by mouth every 6 (six) hours as needed.   Vitamin D (Ergocalciferol) 1.25 MG (50000 UNIT) Caps capsule Commonly known as: DRISDOL TAKE 1 CAPSULE (50,000 UNITS TOTAL) BY MOUTH EVERY 7 (SEVEN) DAYS   VITAMIN D-3 PO 2,000 Units.          REVIEW OF SYSTEMS: A comprehensive ROS was conducted with the patient and is negative except as per HPI    OBJECTIVE:  VS: BP 120/80 (BP Location: Left Arm, Patient Position: Sitting, Cuff Size: Small)   Pulse 70   Ht 5\' 8"  (1.727 m)   Wt 148 lb (67.1 kg)   SpO2 99%   BMI 22.50 kg/m    Wt Readings from Last 3 Encounters:  10/11/23 148 lb (67.1 kg)  09/27/23 144 lb (65.3 kg)  05/24/23 142 lb (64.4 kg)     EXAM: General: Pt appears well and is in NAD  Eyes: External eye exam normal without stare, lid lag or exophthalmos.  EOM intact.  PERRL.  Neck: General: Supple without adenopathy. Thyroid: Thyroid size normal.  No goiter or nodules appreciated.  Lungs: Clear with good BS bilat   Heart: Auscultation: RRR.  Abdomen: Soft, nontender  Extremities:  BL LE: No pretibial edema   Mental Status: Judgment, insight: Intact Orientation: Oriented to time, place, and person Mood and affect: No  depression, anxiety, or agitation     DATA REVIEWED:   Latest Reference Range & Units 10/11/23 10:18  Sodium 135 - 146 mmol/L 142  Potassium 3.5 - 5.3 mmol/L 4.6  Chloride 98 - 110 mmol/L 105  CO2 20 - 32 mmol/L 28  Glucose 65 - 99 mg/dL 88  BUN 7 - 25 mg/dL 11  Creatinine 4.09 - 8.11 mg/dL 9.14  Calcium 8.6 - 78.2 mg/dL 95.6  BUN/Creatinine Ratio 6 - 22 (calc) SEE NOTE:  Vitamin D, 25-Hydroxy 30 - 100 ng/mL 77  Latest Reference Range & Units 10/11/23 10:18  Glucose 65 - 99 mg/dL 88  PTH, Intact 16 - 77 pg/mL 28  Albumin MSPROF 3.6 - 5.1 g/dL 4.2      Old records , labs and images have been reviewed.   ASSESSMENT/PLAN/RECOMMENDATIONS:   Osteoporosis  -Patient with clinical diagnosis of osteoporosis due to compression fractures x 2 -Emphasized the importance of optimizing calcium and vitamin D intake -Emphasized the importance of weightbearing exercises as well -He did not qualify for Evenity, patient did not want to take PTH analogs due to daily injections -He has been tolerating Prolia with no side effects -Vitamin D is upper limit of normal, will stop ergocalciferol and start OTC Vitamin D3  Medications :  Continue MVI daily Calcium carbonate 600 mg daily Stop ergocalciferol 50,000 weekly Start Vitamin D3 2000 international unit daily    Follow-up in 1 yr   Signed electronically by: Lyndle Herrlich, MD  [facility] Endocrinology  [facility] [address] [place] [place], [place] [address] Phone: [phone] FAX: [phone]   CC: Corwin Levins, MD [address] [place] [address] Phone: [phone] Fax: [phone]   Return to Endocrinology clinic as below: Future Appointments  Date Time Provider Department Center  11/22/2023 10:15 AM [facility] NURSE [facility] None

## 2023-10-11 NOTE — Patient Instructions (Signed)
Take multivitamin daily  Take Calcium carbonate 600 mg, 1 tablet daily  Vitamin D 50,000 once weekly ( ergocalciferol)

## 2023-10-12 ENCOUNTER — Encounter: Payer: Self-pay | Admitting: Internal Medicine

## 2023-10-12 LAB — BASIC METABOLIC PANEL
BUN: 11 mg/dL (ref 7–25)
CO2: 28 mmol/L (ref 20–32)
Calcium: 10.1 mg/dL (ref 8.6–10.3)
Chloride: 105 mmol/L (ref 98–110)
Creat: 0.79 mg/dL (ref 0.70–1.28)
Glucose, Bld: 88 mg/dL (ref 65–99)
Potassium: 4.6 mmol/L (ref 3.5–5.3)
Sodium: 142 mmol/L (ref 135–146)

## 2023-10-12 LAB — PARATHYROID HORMONE, INTACT (NO CA): PTH: 28 pg/mL (ref 16–77)

## 2023-10-12 LAB — ALBUMIN: Albumin: 4.2 g/dL (ref 3.6–5.1)

## 2023-10-12 LAB — VITAMIN D 25 HYDROXY (VIT D DEFICIENCY, FRACTURES): Vit D, 25-Hydroxy: 77 ng/mL (ref 30–100)

## 2023-11-06 NOTE — Telephone Encounter (Signed)
 Prolia VOB initiated via [email or web address]  Next Prolia inj DUE: 11/22/23

## 2023-11-12 NOTE — Telephone Encounter (Signed)
 Medical Buy and Annette Stable - Prior Authorization NOT required

## 2023-11-12 NOTE — Telephone Encounter (Signed)
 Patient is ready for scheduling on or after 11/22/23 BUY AND BILL  Out-of-pocket cost due at time of visit: $0  Primary: Medicare Prolia co-insurance: 20% (approximately $331.87) Admin fee co-insurance: 20% (approximately $25)  Deductible: $169.28 of $257 met  Prior Auth: NOT required  Secondary: BC[employer] [place] Medicare Supplement Plan F Prolia co-insurance: Covers Medicare Part B co-insurance Admin fee co-insurance: Covers Medicare Part B co-insurance  Deductible:  Covered by secondary  Prior Auth: NOT required PA# Valid:   ** This summary of benefits is an estimation of the patient's out-of-pocket cost. Exact cost may vary based on individual plan coverage.

## 2023-11-22 ENCOUNTER — Ambulatory Visit (INDEPENDENT_AMBULATORY_CARE_PROVIDER_SITE_OTHER): Payer: Medicare Other

## 2023-11-22 VITALS — BP 122/80 | HR 64 | Ht 68.0 in | Wt 145.0 lb

## 2023-11-22 DIAGNOSIS — M8000XS Age-related osteoporosis with current pathological fracture, unspecified site, sequela: Secondary | ICD-10-CM

## 2023-11-22 MED ORDER — DENOSUMAB 60 MG/ML ~~LOC~~ SOSY: 60.0000 mg | PREFILLED_SYRINGE | SUBCUTANEOUS | Status: AC

## 2023-11-22 MED ORDER — DENOSUMAB 60 MG/ML ~~LOC~~ SOSY
60.0000 mg | PREFILLED_SYRINGE | SUBCUTANEOUS | Status: AC
Start: 2023-11-22 — End: 2023-11-22
  Administered 2023-11-22: 60 mg via SUBCUTANEOUS

## 2023-11-22 NOTE — Progress Notes (Signed)
 After obtaining consent, and per orders of Dr. Lonzo Cloud, injection of Prolia 60mg  given by Oren Barella L Samanthamarie Ezzell in left arm SQ. Patient instructed to remain in clinic for 20 minutes afterwards, and to report any adverse reaction to me immediately.

## 2023-11-23 NOTE — Telephone Encounter (Signed)
 Last Prolia inj 11/22/23 Next Prolia inj due 05/25/24

## 2024-03-30 ENCOUNTER — Encounter: Payer: Self-pay | Admitting: Advanced Practice Midwife

## 2024-04-10 NOTE — Telephone Encounter (Signed)
 Patient is scheduled for Prolia  06/01/24 - does new VOB need to be done

## 2024-04-22 ENCOUNTER — Other Ambulatory Visit: Payer: Self-pay | Admitting: Internal Medicine

## 2024-04-26 ENCOUNTER — Ambulatory Visit (INDEPENDENT_AMBULATORY_CARE_PROVIDER_SITE_OTHER)

## 2024-04-26 ENCOUNTER — Ambulatory Visit: Payer: Self-pay | Admitting: Internal Medicine

## 2024-04-26 ENCOUNTER — Encounter: Payer: Self-pay | Admitting: Internal Medicine

## 2024-04-26 ENCOUNTER — Ambulatory Visit (INDEPENDENT_AMBULATORY_CARE_PROVIDER_SITE_OTHER): Admitting: Internal Medicine

## 2024-04-26 VITALS — BP 114/68 | HR 97 | Temp 98.4°F | Ht 67.25 in | Wt 150.0 lb

## 2024-04-26 VITALS — BP 100/60 | HR 90 | Ht 67.25 in | Wt 140.4 lb

## 2024-04-26 DIAGNOSIS — E7800 Pure hypercholesterolemia, unspecified: Secondary | ICD-10-CM

## 2024-04-26 DIAGNOSIS — R739 Hyperglycemia, unspecified: Secondary | ICD-10-CM | POA: Diagnosis not present

## 2024-04-26 DIAGNOSIS — E559 Vitamin D deficiency, unspecified: Secondary | ICD-10-CM | POA: Diagnosis not present

## 2024-04-26 DIAGNOSIS — F17200 Nicotine dependence, unspecified, uncomplicated: Secondary | ICD-10-CM

## 2024-04-26 DIAGNOSIS — N320 Bladder-neck obstruction: Secondary | ICD-10-CM

## 2024-04-26 DIAGNOSIS — M8000XD Age-related osteoporosis with current pathological fracture, unspecified site, subsequent encounter for fracture with routine healing: Secondary | ICD-10-CM

## 2024-04-26 DIAGNOSIS — Z0000 Encounter for general adult medical examination without abnormal findings: Secondary | ICD-10-CM

## 2024-04-26 DIAGNOSIS — G5602 Carpal tunnel syndrome, left upper limb: Secondary | ICD-10-CM | POA: Diagnosis not present

## 2024-04-26 DIAGNOSIS — E538 Deficiency of other specified B group vitamins: Secondary | ICD-10-CM

## 2024-04-26 LAB — CBC WITH DIFFERENTIAL/PLATELET
Basophils Absolute: 0.1 K/uL (ref 0.0–0.1)
Basophils Relative: 1 % (ref 0.0–3.0)
Eosinophils Absolute: 0.2 K/uL (ref 0.0–0.7)
Eosinophils Relative: 2.4 % (ref 0.0–5.0)
HCT: 47.2 % (ref 39.0–52.0)
Hemoglobin: 15.7 g/dL (ref 13.0–17.0)
Lymphocytes Relative: 32.4 % (ref 12.0–46.0)
Lymphs Abs: 2.3 K/uL (ref 0.7–4.0)
MCHC: 33.3 g/dL (ref 30.0–36.0)
MCV: 105 fl — ABNORMAL HIGH (ref 78.0–100.0)
Monocytes Absolute: 0.9 K/uL (ref 0.1–1.0)
Monocytes Relative: 12.5 % — ABNORMAL HIGH (ref 3.0–12.0)
Neutro Abs: 3.6 K/uL (ref 1.4–7.7)
Neutrophils Relative %: 51.7 % (ref 43.0–77.0)
Platelets: 240 K/uL (ref 150.0–400.0)
RBC: 4.49 Mil/uL (ref 4.22–5.81)
RDW: 13.7 % (ref 11.5–15.5)
WBC: 7 K/uL (ref 4.0–10.5)

## 2024-04-26 LAB — VITAMIN B12: Vitamin B-12: 265 pg/mL (ref 211–911)

## 2024-04-26 LAB — BASIC METABOLIC PANEL WITH GFR
BUN: 13 mg/dL (ref 6–23)
CO2: 28 meq/L (ref 19–32)
Calcium: 10 mg/dL (ref 8.4–10.5)
Chloride: 102 meq/L (ref 96–112)
Creatinine, Ser: 0.95 mg/dL (ref 0.40–1.50)
GFR: 77.62 mL/min (ref >60.00)
Glucose, Bld: 97 mg/dL (ref 70–99)
Potassium: 4.3 meq/L (ref 3.5–5.1)
Sodium: 141 meq/L (ref 135–145)

## 2024-04-26 LAB — URINALYSIS, ROUTINE W REFLEX MICROSCOPIC
Bilirubin Urine: NEGATIVE
Hgb urine dipstick: NEGATIVE
Ketones, ur: NEGATIVE
Leukocytes,Ua: NEGATIVE
Nitrite: NEGATIVE
RBC / HPF: NONE SEEN (ref 0–?)
Specific Gravity, Urine: >=1.03 — AB (ref 1.000–1.030)
Total Protein, Urine: NEGATIVE
Urine Glucose: NEGATIVE
Urobilinogen, UA: 0.2 (ref 0.0–1.0)
pH: 6 (ref 5.0–8.0)

## 2024-04-26 LAB — LIPID PANEL
Cholesterol: 175 mg/dL (ref 0–200)
HDL: 94 mg/dL (ref >39.00)
NonHDL: 80.68
Total CHOL/HDL Ratio: 2
Triglycerides: 443 mg/dL — ABNORMAL HIGH (ref 0.0–149.0)
VLDL: 88.6 mg/dL — ABNORMAL HIGH (ref 0.0–40.0)

## 2024-04-26 LAB — TSH: TSH: 2.14 u[IU]/mL (ref 0.35–5.50)

## 2024-04-26 LAB — HEPATIC FUNCTION PANEL
ALT: 99 U/L — ABNORMAL HIGH (ref 0–53)
AST: 88 U/L — ABNORMAL HIGH (ref 0–37)
Albumin: 4.5 g/dL (ref 3.5–5.2)
Alkaline Phosphatase: 62 U/L (ref 39–117)
Bilirubin, Direct: 0.2 mg/dL (ref 0.0–0.3)
Total Bilirubin: 0.6 mg/dL (ref 0.2–1.2)
Total Protein: 7.4 g/dL (ref 6.0–8.3)

## 2024-04-26 LAB — PSA: PSA: 1.75 ng/mL (ref 0.10–4.00)

## 2024-04-26 LAB — VITAMIN D 25 HYDROXY (VIT D DEFICIENCY, FRACTURES): VITD: 48.4 ng/mL (ref 30.00–100.00)

## 2024-04-26 LAB — LDL CHOLESTEROL, DIRECT: Direct LDL: 43 mg/dL

## 2024-04-26 LAB — HEMOGLOBIN A1C: Hgb A1c MFr Bld: 5.9 % (ref 4.6–6.5)

## 2024-04-26 NOTE — Progress Notes (Signed)
 The test results show that your current treatment is OK, as the tests are stable.  Please continue the same plan.  There is no other need for change of treatment or further evaluation based on these results, at this time.  thanks

## 2024-04-26 NOTE — Assessment & Plan Note (Signed)
 Pt to continue prolia  asd

## 2024-04-26 NOTE — Progress Notes (Signed)
 Subjective:   Manuel Cruz is a 78 y.o. who presents for a Medicare Wellness preventive visit.  As a reminder, Annual Wellness Visits don't include a physical exam, and some assessments may be limited, especially if this visit is performed virtually. We may recommend an in-person follow-up visit with your provider if needed.  Visit Complete: In person  Persons Participating in Visit: Patient.  AWV Questionnaire: No: Patient Medicare AWV questionnaire was not completed prior to this visit.  Cardiac Risk Factors include: advanced age (>68men, >36 women);Other (see comment);dyslipidemia, Risk factor comments: Fatty liver     Objective:    Today's Vitals   04/26/24 1310  Weight: 140 lb 6.4 oz (63.7 kg)  Height: 5' 7.25 (1.708 m)   Body mass index is 21.83 kg/m.     04/26/2024    1:17 PM 12/09/2021   10:53 AM 05/23/2020    1:41 PM 09/28/2019    1:24 PM 05/09/2019    3:49 PM 01/09/2018   10:45 AM 01/04/2017   10:48 AM  Advanced Directives  Does Patient Have a Medical Advance Directive? Yes Yes Yes Yes Yes Yes  Yes   Type of Estate agent of [place];Living will Living will;Healthcare Power of [employer] Power of [employer] Power of [place];Living will Healthcare Power of [place];Living will Healthcare Power of [place];Living will  Does patient want to make changes to medical advance directive? No - Patient declined  No - Patient declined      Copy of Healthcare Power of Attorney in Chart? Yes - validated most recent copy scanned in chart (See row information) Yes - validated most recent copy scanned in chart (See row information)   No - copy requested No - copy requested  No - copy requested      Data saved with a previous flowsheet row definition    Current Medications (verified) Outpatient Encounter Medications as of 04/26/2024  Medication Sig   aspirin  81 MG EC tablet Take 1 tablet (81 mg total) by mouth daily.   atorvastatin   (LIPITOR) 80 MG tablet TAKE 1 TABLET DAILY   b complex vitamins tablet Take 1 tablet by mouth daily.   Carboxymeth-Glycerin-Polysorb (REFRESH OPTIVE ADVANCED OP) Apply 1 drop to eye daily.   Cholecalciferol (VITAMIN D -3 PO) 2,000 Units.   gabapentin  (NEURONTIN ) 300 MG capsule TAKE 2 CAPSULES 3 TIMES A  DAY   ketotifen (ZADITOR) 0.025 % ophthalmic solution Place 1 drop into both eyes 2 (two) times daily as needed.   Loratadine (CLARITIN PO) Take by mouth daily.   Melatonin 3 MG TABS Take 3 mg by mouth at bedtime.   meloxicam  (MOBIC ) 15 MG tablet Take 1 tablet (15 mg total) by mouth daily.   Multiple Minerals-Vitamins (CALCIUM  CITRATE PLUS/MAGNESIUM PO)    Multiple Vitamin (MULTIVITAMIN) tablet Take 1 tablet by mouth daily.   naproxen (NAPROSYN) 250 MG tablet Take by mouth daily as needed.   calcitonin, salmon, (MIACALCIN/FORTICAL) 200 UNIT/ACT nasal spray Spray one spray into one nostril and alternate nostril every day (Patient not taking: Reported on 04/26/2024)   cyclobenzaprine  (FLEXERIL ) 5 MG tablet Take 1 tablet (5 mg total) by mouth 3 (three) times daily as needed for muscle spasms. (Patient not taking: Reported on 04/26/2024)   HYDROcodone  bit-homatropine (HYCODAN) 5-1.5 MG/5ML syrup Take 5 mLs by mouth every 8 (eight) hours as needed for cough. (Patient not taking: Reported on 04/26/2024)   traMADol  (ULTRAM ) 50 MG tablet Take 1 tablet (50 mg total) by mouth every  6 (six) hours as needed. (Patient not taking: Reported on 04/26/2024)   Vitamin D , Ergocalciferol , (DRISDOL ) 1.25 MG (50000 UNIT) CAPS capsule TAKE 1 CAPSULE (50,000 UNITS TOTAL) BY MOUTH EVERY 7 (SEVEN) DAYS (Patient not taking: Reported on 04/26/2024)   Facility-Administered Encounter Medications as of 04/26/2024  Medication   [START ON 05/20/2024] denosumab  (PROLIA ) injection 60 mg    Allergies (verified) Patient has no known allergies.   History: Past Medical History:  Diagnosis Date   Allergic rhinitis    Allergy     seasonal allergies   GERD (gastroesophageal reflux disease)    on meds   Hyperlipidemia    on meds   Osteopenia 2021   Peripheral neuropathy    Sciatica of right side    recurrent   Sleep apnea    pt denies   Squamous cell carcinoma in situ (SCCIS) 06/15/2005   Outer Right Rim of Ear (Cx3,5FU)   Squamous cell carcinoma in situ (SCCIS) 02/01/2012   Left Sideburn (tx p bx)   Squamous cell carcinoma in situ (SCCIS) 04/05/2016   Left Forearm(Cx3,5FU), Left Sideburn Sup(Cx3,5FU), and Left Sideburn Inf (Cx3,5FU)   Squamous cell carcinoma in situ (SCCIS) 05/06/2016   Left Side Neck (tx p bx)   Squamous cell skin cancer, earlobe    Warts, genital    Past Surgical History:  Procedure Laterality Date   COLONOSCOPY  08/12/2005   SA-MAC-(good)suprep-TA/tics   LUMBAR DISC SURGERY  2004   LUMBAR DISC SURGERY  05/2018   for comression fx   MOUTH SURGERY  2019   implants   TONSILLECTOMY     Family History  Problem Relation Age of Onset   Hyperlipidemia Father    Lymphoma Father 47   Bladder Cancer Father 82   Hernia Brother    Lung cancer Mother 27   Brain cancer Mother 24   Colon polyps Neg Hx    Colon cancer Neg Hx    Esophageal cancer Neg Hx    Rectal cancer Neg Hx    Stomach cancer Neg Hx    Social History   Socioeconomic History   Marital status: Significant Other    Spouse name: Not on file   Number of children: 0   Years of education: Not on file   Highest education level: Bachelor's degree (e.g., BA, AB, BS)  Occupational History   Occupation: insurance    Comment: retired   Occupation: RETIRED  Tobacco Use   Smoking status: Every Day    Current packs/day: 0.25    Types: Cigarettes   Smokeless tobacco: Never  Vaping Use   Vaping status: Never Used  Substance and Sexual Activity   Alcohol use: Yes    Alcohol/week: 20.0 standard drinks of alcohol    Types: 20 Glasses of wine per week   Drug use: No   Sexual activity: Yes  Other Topics Concern   Not on  file  Social History Narrative   Lives with sig other/2025   Caffeine- sodas 2 daily    Social Drivers of Health   Financial Resource Strain: Low Risk  (04/26/2024)   Overall Financial Resource Strain (CARDIA)    Difficulty of Paying Living Expenses: Not very hard  Food Insecurity: No Food Insecurity (04/26/2024)   Hunger Vital Sign    Worried About Running Out of Food in the Last Year: Never true    Ran Out of Food in the Last Year: Never true  Transportation Needs: No Transportation Needs (04/26/2024)   PRAPARE -  Administrator, Civil Service (Medical): No    Lack of Transportation (Non-Medical): No  Physical Activity: Insufficiently Active (04/26/2024)   Exercise Vital Sign    Days of Exercise per Week: 4 days    Minutes of Exercise per Session: 20 min  Stress: No Stress Concern Present (04/26/2024)   [employer] of Occupational Health - Occupational Stress Questionnaire    Feeling of Stress: Not at all  Social Connections: Moderately Integrated (04/26/2024)   Social Connection and Isolation Panel    Frequency of Communication with Friends and Family: More than three times a week    Frequency of Social Gatherings with Friends and Family: Three times a week    Attends Religious Services: 1 to 4 times per year    Active Member of Clubs or Organizations: No    Attends Banker Meetings: Never    Marital Status: Living with partner    Tobacco Counseling Ready to quit: Not Answered Counseling given: Not Answered    Clinical Intake:  Pre-visit preparation completed: Yes  Pain : No/denies pain     BMI - recorded: 21.83 Nutritional Status: BMI of 19-24  Normal Nutritional Risks: Nausea/ vomitting/ diarrhea (some diarrhea) Diabetes: No  Lab Results  Component Value Date   HGBA1C 5.8 12/14/2022   HGBA1C 5.9 12/09/2021   HGBA1C 5.6 06/05/2021     How often do you need to have someone help you when you read instructions, pamphlets, or other  written materials from your doctor or pharmacy?: 1 - Never  Interpreter Needed?: No  Information entered by :: Meaghann Choo, RMA   Activities of Daily Living     04/26/2024    1:04 PM  In your present state of health, do you have any difficulty performing the following activities:  Hearing? 0  Vision? 0  Difficulty concentrating or making decisions? 0  Walking or climbing stairs? 0  Dressing or bathing? 0  Doing errands, shopping? 0  Preparing Food and eating ? N  Using the Toilet? N  In the past six months, have you accidently leaked urine? N  Do you have problems with loss of bowel control? N  Managing your Medications? N  Managing your Finances? N  Housekeeping or managing your Housekeeping? N    Patient Care Team: Norleen Lynwood ORN, MD as PCP - Diedre Livingston Rigg, MD as Consulting Physician (Dermatology) Camillo Golas, MD as Attending Physician (Ophthalmology) Cheree Banks, MD as Referring Physician (Ophthalmology)  I have updated your Care Teams any recent Medical Services you may have received from other providers in the past year.     Assessment:   This is a routine wellness examination for Dajion.  Hearing/Vision screen Hearing Screening - Comments:: Denies hearing difficulties   Vision Screening - Comments:: Wears eyeglasses/Dr. Marius   Goals Addressed             This Visit's Progress    Client understands the importance of follow-up with providers by attending scheduled visits   On track      Depression Screen     04/26/2024    1:21 PM 12/14/2022    1:24 PM 12/14/2022    1:02 PM 06/14/2022    1:29 PM 06/14/2022    1:10 PM 12/09/2021   10:40 AM 12/09/2021    8:56 AM  PHQ 2/9 Scores  PHQ - 2 Score 1 0 0 1  0 0  PHQ- 9 Score 3 1 1       Exception  Documentation     Patient refusal      Fall Risk     04/26/2024    1:17 PM 12/14/2022    1:27 PM 12/14/2022    1:02 PM 06/14/2022    1:10 PM 12/09/2021    8:56 AM  Fall Risk   Falls in the past year? 0 0 1  1 1   Number falls in past yr: 0 0 0 1 0  Injury with Fall? 0 0 0 0 1  Risk for fall due to :  Impaired balance/gait Impaired balance/gait Other (Comment)   Follow up Falls evaluation completed;Falls prevention discussed Falls prevention discussed Falls evaluation completed;Education provided Falls evaluation completed       Data saved with a previous flowsheet row definition    MEDICARE RISK AT HOME:  Medicare Risk at Home Any stairs in or around the home?: Yes (in front of home) If so, are there any without handrails?: No Home free of loose throw rugs in walkways, pet beds, electrical cords, etc?: Yes Adequate lighting in your home to reduce risk of falls?: Yes Life alert?: No Use of a cane, walker or w/c?: No Grab bars in the bathroom?: Yes Shower chair or bench in shower?: No Elevated toilet seat or a handicapped toilet?: No  TIMED UP AND GO:  Was the test performed?  Yes  Length of time to ambulate 10 feet: 15 sec Gait steady and fast without use of assistive device  Cognitive Function: Declined/Normal: No cognitive concerns noted by patient or family. Patient alert, oriented, able to answer questions appropriately and recall recent events. No signs of memory loss or confusion.        12/14/2022    1:27 PM  6CIT Screen  What Year? 0 points  What month? 0 points  What time? 0 points  Count back from 20 0 points  Months in reverse 0 points  Repeat phrase 0 points  Total Score 0 points    Immunizations Immunization History  Administered Date(s) Administered   Fluad Quad(high Dose 65+) 06/02/2020, 06/05/2021   H1N1 08/13/2008   Influenza Whole 06/13/2008   Influenza, High Dose Seasonal PF 06/08/2018, 04/30/2019, 04/30/2019, 06/07/2022   Influenza,inj,Quad PF,6+ Mos 06/11/2014   Influenza-Unspecified 04/25/2019   Moderna Covid-19 Fall Seasonal Vaccine 80yrs & older 06/06/2023   PFIZER Comirnaty(Gray Top)Covid-19 Tri-Sucrose Vaccine 12/30/2020   PFIZER(Purple  Top)SARS-COV-2 Vaccination 10/08/2019, 10/29/2019   Pfizer Covid-19 Vaccine Bivalent Booster 21yrs & up 08/08/2021   Pneumococcal Conjugate-13 03/15/2013, 04/10/2014   Pneumococcal Polysaccharide-23 04/17/2015   Respiratory Syncytial Virus Vaccine,Recomb Aduvanted(Arexvy) 08/07/2022   Td 10/24/2008   Tdap 11/08/2018   Zoster Recombinant(Shingrix) 06/02/2017, 11/27/2017   Zoster, Live 01/19/2010    Screening Tests Health Maintenance  Topic Date Due   COVID-19 Vaccine (6 - Pfizer risk 2024-25 season) 12/04/2023   INFLUENZA VACCINE  04/13/2024   Medicare Annual Wellness (AWV)  04/26/2025   Colonoscopy  09/11/2027   DTaP/Tdap/Td (3 - Td or Tdap) 11/08/2028   Pneumococcal Vaccine: 50+ Years  Completed   Hepatitis C Screening  Completed   Zoster Vaccines- Shingrix  Completed   HPV VACCINES  Aged Out   Meningococcal B Vaccine  Aged Out    Health Maintenance  Health Maintenance Due  Topic Date Due   COVID-19 Vaccine (6 - Pfizer risk 2024-25 season) 12/04/2023   INFLUENZA VACCINE  04/13/2024   Health Maintenance Items Addressed: See Nurse Notes at the end of this note  Additional Screening:  Vision Screening: Recommended annual  ophthalmology exams for early detection of glaucoma and other disorders of the eye. Would you like a referral to an eye doctor? No    Dental Screening: Recommended annual dental exams for proper oral hygiene  Community Resource Referral / Chronic Care Management: CRR required this visit?  No   CCM required this visit?  No   Plan:    I have personally reviewed and noted the following in the patient's chart:   Medical and social history Use of alcohol, tobacco or illicit drugs  Current medications and supplements including opioid prescriptions. Patient is not currently taking opioid prescriptions. Functional ability and status Nutritional status Physical activity Advanced directives List of other physicians Hospitalizations, surgeries, and ER  visits in previous 12 months Vitals Screenings to include cognitive, depression, and falls Referrals and appointments  In addition, I have reviewed and discussed with patient certain preventive protocols, quality metrics, and best practice recommendations. A written personalized care plan for preventive services as well as general preventive health recommendations were provided to patient.   Bart Ashford L Kalla Watson, CMA   04/26/2024   After Visit Summary: (In Person-Printed) AVS printed and given to the patient  Notes: Patient would like to discuss lt hand pain starting 1 month ago.  He would like to discuss during his visit today with Dr. Norleen. Patient is up to date on all health maintenance.

## 2024-04-26 NOTE — Assessment & Plan Note (Signed)
 mod, for hand surgury referral,  to f/u any worsening symptoms or concerns\

## 2024-04-26 NOTE — Assessment & Plan Note (Signed)
 Pt counsled to quit, pt not ready

## 2024-04-26 NOTE — Assessment & Plan Note (Signed)
 Last vitamin D  Lab Results  Component Value Date   VD25OH 48.40 04/26/2024   Stable, cont oral replacement

## 2024-04-26 NOTE — Patient Instructions (Signed)
 Mr. Wirick , Thank you for taking time out of your busy schedule to complete your Annual Wellness Visit with me. I enjoyed our conversation and look forward to speaking with you again next year. I, as well as your care team,  appreciate your ongoing commitment to your health goals. Please review the following plan we discussed and let me know if I can assist you in the future. Your Game plan/ To Do List     Follow up Visits: We will see or speak with you next year for your Next Medicare AWV with our clinical staff Have you seen your provider in the last 6 months (3 months if uncontrolled diabetes)? Yes.  Next office visit on 04/26/2024.  Clinician Recommendations:  Aim for 30 minutes of exercise or brisk walking, 6-8 glasses of water, and 5 servings of fruits and vegetables each day. Keep up the good work.      This is a list of the screenings recommended for you:  Health Maintenance  Topic Date Due   COVID-19 Vaccine (6 - Pfizer risk 2024-25 season) 12/04/2023   Flu Shot  04/13/2024   Medicare Annual Wellness Visit  04/26/2025   Colon Cancer Screening  09/11/2027   DTaP/Tdap/Td vaccine (3 - Td or Tdap) 11/08/2028   Pneumococcal Vaccine for age over 68  Completed   Hepatitis C Screening  Completed   Zoster (Shingles) Vaccine  Completed   HPV Vaccine  Aged Out   Meningitis B Vaccine  Aged Out    Advanced directives: (In Chart) A copy of your advanced directives are scanned into your chart should your provider ever need it. Advance Care Planning is important because it:  [x]  Makes sure you receive the medical care that is consistent with your values, goals, and preferences  [x]  It provides guidance to your family and loved ones and reduces their decisional burden about whether or not they are making the right decisions based on your wishes.  Follow the link provided in your after visit summary or read over the paperwork we have mailed to you to help you started getting your Advance  Directives in place. If you need assistance in completing these, please reach out to us  so that we can help you!  See attachments for Preventive Care and Fall Prevention Tips.

## 2024-04-26 NOTE — Assessment & Plan Note (Signed)
 Lab Results  Component Value Date   HGBA1C 5.9 04/26/2024   Stable, pt to continue current medical treatment  - diet, wt control

## 2024-04-26 NOTE — Patient Instructions (Signed)
 Please continue all other medications as before, and refills have been done if requested.  Please have the pharmacy call with any other refills you may need.  Please continue your efforts at being more active, low cholesterol diet, and weight control.  You are otherwise up to date with prevention measures today.  Please keep your appointments with your specialists as you may have planned  You will be contacted regarding the referral for: Hand Surgury  Please go to the LAB at the blood drawing area for the tests to be done  You will be contacted by phone if any changes need to be made immediately.  Otherwise, you will receive a letter about your results with an explanation, but please check with MyChart first.  Please make an Appointment to return for your 1 year visit, or sooner if needed

## 2024-04-26 NOTE — Assessment & Plan Note (Signed)
 Lab Results  Component Value Date   LDLCALC 42 12/14/2022   Stable, pt to continue current statin lipitor 80 qd

## 2024-04-26 NOTE — Progress Notes (Signed)
 Patient ID: Manuel Cruz, male   DOB: Feb 06, 1946, 78 y.o.   MRN: [id number]         Chief Complaint:: yearly exam       HPI:  Manuel Cruz is a 78 y.o. male here overall doing ok,  Pt denies chest pain, increased sob or doe, wheezing, orthopnea, PND, increased LE swelling, palpitations, dizziness or syncope.   Pt denies polydipsia, polyuria, or new focal neuro s/s.    Pt denies fever, wt loss, night sweats, loss of appetite, or other constitutional symptoms  Still smoking, not ready to quit;  pt for flu shot at pharmacy when stocked  Tolerating Prolia  well. No recent new fractures  Does have 2 month onset numbness and pain to right thumb, index and middle fingers constant, mod to severe , worse ot use the hand Wt Readings from Last 3 Encounters:  04/26/24 150 lb (68 kg)  04/26/24 140 lb 6.4 oz (63.7 kg)  11/22/23 145 lb (65.8 kg)   BP Readings from Last 3 Encounters:  04/26/24 114/68  04/26/24 100/60  11/22/23 122/80   Immunization History  Administered Date(s) Administered   Fluad Quad(high Dose 65+) 06/02/2020, 06/05/2021   H1N1 08/13/2008   Influenza Whole 06/13/2008   Influenza, High Dose Seasonal PF 06/08/2018, 04/30/2019, 04/30/2019, 06/07/2022   Influenza,inj,Quad PF,6+ Mos 06/11/2014   Influenza-Unspecified 04/25/2019   Moderna Covid-19 Fall Seasonal Vaccine 69yrs & older 06/06/2023   PFIZER Comirnaty(Gray Top)Covid-19 Tri-Sucrose Vaccine 12/30/2020   PFIZER(Purple Top)SARS-COV-2 Vaccination 10/08/2019, 10/29/2019   Pfizer Covid-19 Vaccine Bivalent Booster 54yrs & up 08/08/2021   Pneumococcal Conjugate-13 03/15/2013, 04/10/2014   Pneumococcal Polysaccharide-23 04/17/2015   Respiratory Syncytial Virus Vaccine,Recomb Aduvanted(Arexvy) 08/07/2022   Td 10/24/2008   Tdap 11/08/2018   Zoster Recombinant(Shingrix) 06/02/2017, 11/27/2017   Zoster, Live 01/19/2010   Health Maintenance Due  Topic Date Due   INFLUENZA VACCINE  04/13/2024      Past Medical History:  Diagnosis  Date   Allergic rhinitis    Allergy    seasonal allergies   GERD (gastroesophageal reflux disease)    on meds   Hyperlipidemia    on meds   Osteopenia 2021   Peripheral neuropathy    Sciatica of right side    recurrent   Sleep apnea    pt denies   Squamous cell carcinoma in situ (SCCIS) 06/15/2005   Outer Right Rim of Ear (Cx3,5FU)   Squamous cell carcinoma in situ (SCCIS) 02/01/2012   Left Sideburn (tx p bx)   Squamous cell carcinoma in situ (SCCIS) 04/05/2016   Left Forearm(Cx3,5FU), Left Sideburn Sup(Cx3,5FU), and Left Sideburn Inf (Cx3,5FU)   Squamous cell carcinoma in situ (SCCIS) 05/06/2016   Left Side Neck (tx p bx)   Squamous cell skin cancer, earlobe    Warts, genital    Past Surgical History:  Procedure Laterality Date   COLONOSCOPY  08/12/2005   SA-MAC-(good)suprep-TA/tics   LUMBAR DISC SURGERY  2004   LUMBAR DISC SURGERY  05/2018   for comression fx   MOUTH SURGERY  2019   implants   TONSILLECTOMY      reports that he has been smoking cigarettes. He has never used smokeless tobacco. He reports current alcohol use of about 20.0 standard drinks of alcohol per week. He reports that he does not use drugs. family history includes Bladder Cancer (age of onset: 99) in his father; Brain cancer (age of onset: 24) in his mother; Hernia in his brother; Hyperlipidemia in his father; Lung cancer (age  of onset: 73) in his mother; Lymphoma (age of onset: 39) in his father. No Known Allergies Current Outpatient Medications on File Prior to Visit  Medication Sig Dispense Refill   aspirin  81 MG EC tablet Take 1 tablet (81 mg total) by mouth daily. 90 tablet 1   atorvastatin  (LIPITOR) 80 MG tablet TAKE 1 TABLET DAILY 90 tablet 0   b complex vitamins tablet Take 1 tablet by mouth daily.     calcitonin, salmon, (MIACALCIN/FORTICAL) 200 UNIT/ACT nasal spray Spray one spray into one nostril and alternate nostril every day (Patient not taking: Reported on 04/26/2024) 3.7 mL 11    Carboxymeth-Glycerin-Polysorb (REFRESH OPTIVE ADVANCED OP) Apply 1 drop to eye daily.     Cholecalciferol (VITAMIN D -3 PO) 2,000 Units.     cyclobenzaprine  (FLEXERIL ) 5 MG tablet Take 1 tablet (5 mg total) by mouth 3 (three) times daily as needed for muscle spasms. (Patient not taking: Reported on 04/26/2024) 40 tablet 2   gabapentin  (NEURONTIN ) 300 MG capsule TAKE 2 CAPSULES 3 TIMES A  DAY 540 capsule 1   HYDROcodone  bit-homatropine (HYCODAN) 5-1.5 MG/5ML syrup Take 5 mLs by mouth every 8 (eight) hours as needed for cough. (Patient not taking: Reported on 04/26/2024) 120 mL 0   ketotifen (ZADITOR) 0.025 % ophthalmic solution Place 1 drop into both eyes 2 (two) times daily as needed.     Loratadine (CLARITIN PO) Take by mouth daily.     Melatonin 3 MG TABS Take 3 mg by mouth at bedtime.     meloxicam  (MOBIC ) 15 MG tablet Take 1 tablet (15 mg total) by mouth daily. 90 tablet 1   Multiple Minerals-Vitamins (CALCIUM  CITRATE PLUS/MAGNESIUM PO)      Multiple Vitamin (MULTIVITAMIN) tablet Take 1 tablet by mouth daily.     naproxen (NAPROSYN) 250 MG tablet Take by mouth daily as needed.     traMADol  (ULTRAM ) 50 MG tablet Take 1 tablet (50 mg total) by mouth every 6 (six) hours as needed. (Patient not taking: Reported on 04/26/2024) 30 tablet 0   Vitamin D , Ergocalciferol , (DRISDOL ) 1.25 MG (50000 UNIT) CAPS capsule TAKE 1 CAPSULE (50,000 UNITS TOTAL) BY MOUTH EVERY 7 (SEVEN) DAYS (Patient not taking: Reported on 04/26/2024) 12 capsule 0   Current Facility-Administered Medications on File Prior to Visit  Medication Dose Route Frequency Provider Last Rate Last Admin   [START ON 05/20/2024] denosumab  (PROLIA ) injection 60 mg  60 mg Subcutaneous Q6 months Shamleffer, Ibtehal Jaralla, MD            ROS:  All others reviewed and negative.  Objective        PE:  BP 114/68 (BP Location: Left Arm, Patient Position: Sitting, Cuff Size: Normal)   Pulse 97   Temp 98.4 F (36.9 C) (Oral)   Ht 5' 7.25 (1.708 m)    Wt 150 lb (68 kg)   SpO2 98%   BMI 23.32 kg/m                 Constitutional: Pt appears in NAD               HENT: Head: NCAT.                Right Ear: External ear normal.                 Left Ear: External ear normal.                Eyes: . Pupils are equal, round,  and reactive to light. Conjunctivae and EOM are normal               Nose: without d/c or deformity               Neck: Neck supple. Gross normal ROM               Cardiovascular: Normal rate and regular rhythm.                 Pulmonary/Chest: Effort normal and breath sounds without rales or wheezing.                Abd:  Soft, NT, ND, + BS, no organomegaly               Neurological: Pt is alert. At baseline orientation, motor grossly intact               Skin: Skin is warm. No rashes, no other new lesions, LE edema - none               Psychiatric: Pt behavior is normal without agitation   Micro: none  Cardiac tracings I have personally interpreted today:  none  Pertinent Radiological findings (summarize): none   Lab Results  Component Value Date   WBC 7.0 04/26/2024   HGB 15.7 04/26/2024   HCT 47.2 04/26/2024   PLT 240.0 04/26/2024   GLUCOSE 97 04/26/2024   CHOL 175 04/26/2024   TRIG (H) 04/26/2024    443.0 Triglyceride is over 400; calculations on Lipids are invalid.   HDL 94.00 04/26/2024   LDLDIRECT 43.0 04/26/2024   LDLCALC 42 12/14/2022   ALT 99 (H) 04/26/2024   AST 88 (H) 04/26/2024   NA 141 04/26/2024   K 4.3 04/26/2024   CL 102 04/26/2024   CREATININE 0.95 04/26/2024   BUN 13 04/26/2024   CO2 28 04/26/2024   TSH 2.14 04/26/2024   PSA 1.75 04/26/2024   HGBA1C 5.9 04/26/2024   Assessment/Plan:  Manuel Cruz is a 78 y.o. White or Caucasian [1] male with  has a past medical history of Allergic rhinitis, Allergy, GERD (gastroesophageal reflux disease), Hyperlipidemia, Osteopenia (2021), Peripheral neuropathy, Sciatica of right side, Sleep apnea, Squamous cell carcinoma in situ (SCCIS)  (06/15/2005), Squamous cell carcinoma in situ (SCCIS) (02/01/2012), Squamous cell carcinoma in situ (SCCIS) (04/05/2016), Squamous cell carcinoma in situ (SCCIS) (05/06/2016), Squamous cell skin cancer, earlobe, and Warts, genital.  Vitamin D  deficiency Last vitamin D  Lab Results  Component Value Date   VD25OH 48.40 04/26/2024   Stable, cont oral replacement   Smoker Pt counsled to quit, pt not ready  Osteoporosis with pathological fracture Pt to continue prolia  asd  Hyperlipidemia Lab Results  Component Value Date   LDLCALC 42 12/14/2022   Stable, pt to continue current statin lipitor 80 qd   Hyperglycemia Lab Results  Component Value Date   HGBA1C 5.9 04/26/2024   Stable, pt to continue current medical treatment  - diet, wt control   Left carpal tunnel syndrome  mod, for hand surgury referral,  to f/u any worsening symptoms or concerns\  Followup: Return in about 1 year (around 04/26/2025).  Manuel Rush, MD 04/26/2024 8:41 PM [facility]

## 2024-04-28 NOTE — Telephone Encounter (Signed)
 Prolia VOB initiated via [email or web address]  Next Prolia inj DUE: 05/25/24

## 2024-05-01 NOTE — Telephone Encounter (Signed)
 [employer] and [employer] Stable - Prior Authorization NOT required for [facility] [employer]

## 2024-05-01 NOTE — Telephone Encounter (Signed)
 [employer]  Patient is ready for scheduling on or after 05/25/24  Out-of-pocket cost due at time of visit: $0  Primary: [place]  Medicare Prolia  co-insurance: 20% (approximately $331.87) Admin fee co-insurance: 20% (approximately $25)  Deductible: $257 of $257 met  Prior Auth: NOT required  Secondary: BCBS [place] Medicare Supplement Plan F Prolia  co-insurance: Covers Medicare Part B co-insurance Admin fee co-insurance: Covers Medicare Part B co-insurance  Deductible:  Covered by secondary  Prior Auth: NOT required PA# Valid:   ** This summary of benefits is an estimation of the patient's out-of-pocket cost. Exact cost may vary based on individual plan coverage.

## 2024-05-29 ENCOUNTER — Ambulatory Visit

## 2024-06-01 ENCOUNTER — Ambulatory Visit

## 2024-06-15 ENCOUNTER — Ambulatory Visit

## 2024-07-03 ENCOUNTER — Other Ambulatory Visit: Payer: Self-pay | Admitting: Internal Medicine

## 2024-07-03 ENCOUNTER — Other Ambulatory Visit: Payer: Self-pay

## 2024-07-27 ENCOUNTER — Ambulatory Visit

## 2024-07-27 VITALS — BP 98/60 | HR 81 | Resp 16 | Ht 67.25 in | Wt 144.2 lb

## 2024-07-27 DIAGNOSIS — M8000XS Age-related osteoporosis with current pathological fracture, unspecified site, sequela: Secondary | ICD-10-CM | POA: Diagnosis not present

## 2024-07-27 MED ORDER — DENOSUMAB 60 MG/ML ~~LOC~~ SOSY
60.0000 mg | PREFILLED_SYRINGE | Freq: Once | SUBCUTANEOUS | Status: AC
  Administered 2024-07-27: 60 mg via SUBCUTANEOUS

## 2024-07-27 MED ORDER — DENOSUMAB 60 MG/ML ~~LOC~~ SOSY: 60.0000 mg | PREFILLED_SYRINGE | SUBCUTANEOUS | Status: AC

## 2024-07-27 NOTE — Progress Notes (Signed)
 After obtaining consent, and per orders of Dr. Sam, injection of Prolia given by Dietrich LOISE Lax. Patient instructed to remain in clinic for 20 minutes afterwards, and to report any adverse reaction to me immediately.

## 2024-07-30 NOTE — Telephone Encounter (Signed)
 Last Prolia  inj 07/27/24 Next Prolia  inj due 01/25/25

## 2024-10-10 ENCOUNTER — Encounter: Payer: Self-pay | Admitting: Internal Medicine

## 2024-10-10 ENCOUNTER — Other Ambulatory Visit

## 2024-10-10 ENCOUNTER — Ambulatory Visit: Payer: Medicare Other | Admitting: Internal Medicine

## 2024-10-10 VITALS — BP 138/78 | Ht 67.25 in | Wt 151.0 lb

## 2024-10-10 DIAGNOSIS — M810 Age-related osteoporosis without current pathological fracture: Secondary | ICD-10-CM | POA: Insufficient documentation

## 2024-10-10 NOTE — Progress Notes (Unsigned)
 "   Name: Manuel Cruz  MRN/ DOB: [id number], 09-Sep-1946    Age/ Sex: 79 y.o., male    PCP: Norleen Lynwood ORN, MD   Reason for Endocrinology Evaluation: Osteoporosis     Date of Initial Endocrinology Evaluation: 04/08/2023    HPI: Manuel Cruz is a 79 y.o. male with a past medical history of dyslipidemia and osteoporosis. The patient presented for initial endocrinology clinic visit on 04/08/2023 for consultative assistance with his osteoporosis.   Pt was diagnosed with osteoporosis:  Fracture Hx: L3 compression fracture (2019) while trying to lift someone, T11 compression fracture (2023) FH of osteoporosis or hip fracture: Mother  Prior Hx of anti-resorptive therapy : calcitonin discontinued 05/2023.    In reviewing his chart, the patient has been noted with normal vitamin D , TFTs and testosterone  2023   No hx of glucocorticoids No Prior XRT  No hx of cancer  NO CVA of CAD    Patient did not qualify for Evenity.  He declined PTH analogs due to daily injections  Received first Prolia  injection 05/24/2023  SUBJECTIVE:    Today (10/10/24):  Manuel Cruz is here for follow-up on osteoporosis management.   Prolia  injection 07/27/2024 Patient has been following up with orthopedics for left carpal tunnel syndrome No recent rash  No falls  Has had minimal diarrhea but no constipation  No heartburn or nausea  Periodontist for deal implant    He has not been consistent with weightbearing exercises   MVI - not taking  Calcium  carbonate 500 mg daily  Vitamin D3 2000 units daily Prolia  60 mg every 6 months      HISTORY:  Past Medical History:  Past Medical History:  Diagnosis Date   Allergic rhinitis    Allergy    seasonal allergies   GERD (gastroesophageal reflux disease)    on meds   Hyperlipidemia    on meds   Osteopenia 2021   Peripheral neuropathy    Sciatica of right side    recurrent   Sleep apnea    pt denies   Squamous cell carcinoma in situ  (SCCIS) 06/15/2005   Outer Right Rim of Ear (Cx3,5FU)   Squamous cell carcinoma in situ (SCCIS) 02/01/2012   Left Sideburn (tx p bx)   Squamous cell carcinoma in situ (SCCIS) 04/05/2016   Left Forearm(Cx3,5FU), Left Sideburn Sup(Cx3,5FU), and Left Sideburn Inf (Cx3,5FU)   Squamous cell carcinoma in situ (SCCIS) 05/06/2016   Left Side Neck (tx p bx)   Squamous cell skin cancer, earlobe    Warts, genital    Past Surgical History:  Past Surgical History:  Procedure Laterality Date   COLONOSCOPY  08/12/2005   SA-MAC-(good)suprep-TA/tics   LUMBAR DISC SURGERY  2004   LUMBAR DISC SURGERY  05/2018   for comression fx   MOUTH SURGERY  2019   implants   TONSILLECTOMY      Social History:  reports that he has been smoking cigarettes. He has never used smokeless tobacco. He reports current alcohol use of about 20.0 standard drinks of alcohol per week. He reports that he does not use drugs. Family History: family history includes Bladder Cancer (age of onset: 67) in his father; Brain cancer (age of onset: 73) in his mother; Hernia in his brother; Hyperlipidemia in his father; Lung cancer (age of onset: 11) in his mother; Lymphoma (age of onset: 31) in his father.   HOME MEDICATIONS: Allergies as of 10/10/2024   No Known Allergies  Medication List        Accurate as of October 10, 2024 10:24 AM. If you have any questions, ask your nurse or doctor.          STOP taking these medications    calcitonin (salmon) 200 UNIT/ACT nasal spray Commonly known as: MIACALCIN/FORTICAL Stopped by: Donell Butts, MD   CALCIUM  CITRATE PLUS/MAGNESIUM PO Stopped by: Donell Butts, MD   cyclobenzaprine  5 MG tablet Commonly known as: FLEXERIL  Stopped by: Donell Butts, MD   HYDROcodone  bit-homatropine 5-1.5 MG/5ML syrup Commonly known as: HYCODAN Stopped by: Donell Butts, MD   melatonin 3 MG Tabs tablet Stopped by: Donell Butts, MD   multivitamin  tablet Stopped by: Donell Butts, MD   traMADol  50 MG tablet Commonly known as: ULTRAM  Stopped by: Donell Butts, MD   Vitamin D  (Ergocalciferol ) 1.25 MG (50000 UNIT) Caps capsule Commonly known as: DRISDOL  Stopped by: Donell Butts, MD   VITAMIN D -3 PO Stopped by: Donell Butts, MD       TAKE these medications    aspirin  EC 81 MG tablet Take 1 tablet (81 mg total) by mouth daily.   atorvastatin  80 MG tablet Commonly known as: LIPITOR TAKE 1 TABLET DAILY   b complex vitamins tablet Take 1 tablet by mouth daily.   CLARITIN PO Take by mouth daily.   gabapentin  300 MG capsule Commonly known as: NEURONTIN  Take 600 mg by mouth daily at 6 (six) AM. What changed: Another medication with the same name was removed. Continue taking this medication, and follow the directions you see here. Changed by: Donell Butts, MD   ketotifen 0.025 % ophthalmic solution Commonly known as: ZADITOR Place 1 drop into both eyes 2 (two) times daily as needed.   meloxicam  15 MG tablet Commonly known as: MOBIC  Take 1 tablet (15 mg total) by mouth daily.   naproxen 250 MG tablet Commonly known as: NAPROSYN Take by mouth daily as needed.   REFRESH OPTIVE ADVANCED OP Apply 1 drop to eye daily.          REVIEW OF SYSTEMS: A comprehensive ROS was conducted with the patient and is negative except as per HPI    OBJECTIVE:  VS: BP 138/78   Ht 5' 7.25 (1.708 m)   Wt 151 lb (68.5 kg)   BMI 23.47 kg/m    Wt Readings from Last 3 Encounters:  10/10/24 151 lb (68.5 kg)  07/27/24 144 lb 3.2 oz (65.4 kg)  04/26/24 150 lb (68 kg)     EXAM: General: Pt appears well and is in NAD  Eyes: External eye exam normal without stare, lid lag or exophthalmos.  EOM intact.  PERRL.  Neck: General: Supple without adenopathy. Thyroid : Thyroid  size normal.  No goiter or nodules appreciated.  Lungs: Clear with good BS bilat   Heart: Auscultation: RRR.  Abdomen: Soft,  nontender  Extremities:  BL LE: No pretibial edema   Mental Status: Judgment, insight: Intact Orientation: Oriented to time, place, and person Mood and affect: No depression, anxiety, or agitation     DATA REVIEWED: *****    Latest Reference Range & Units 04/26/24 15:01  Sodium 135 - 145 mEq/L 141  Potassium 3.5 - 5.1 mEq/L 4.3  Chloride 96 - 112 mEq/L 102  CO2 19 - 32 mEq/L 28  Glucose 70 - 99 mg/dL 97  BUN 6 - 23 mg/dL 13  Creatinine 9.59 - 8.49 mg/dL 9.04  Calcium  8.4 - 10.5 mg/dL 89.9  Alkaline Phosphatase 39 - 117 U/L 62  Albumin 3.5 - 5.2 g/dL 4.5  GFR >39.99 mL/min 77.62    Latest Reference Range & Units 04/26/24 15:01  TSH 0.35 - 5.50 uIU/mL 2.14  PSA 0.10 - 4.00 ng/mL 1.75      Old records , labs and images have been reviewed.   ASSESSMENT/PLAN/RECOMMENDATIONS:   Osteoporosis  -Patient with clinical diagnosis of osteoporosis due to compression fractures x 2 -Emphasized the importance of optimizing calcium , vitamin D  and weightbearing exercises -He did not qualify for Evenity, patient did not want to take PTH analogs due to daily injections -He has been tolerating Prolia  with no side effects - Patient is following up with periodontist for dental procedure, the plan is to have the implant performed prior to his next Prolia  injection. - Patient is due for repeat DXA scan, there we will schedule this at [facility] office, phone number provided  Medications : Restart MVI daily Calcium  carbonate 600 mg daily Vitamin D3 2000 international unit daily    Follow-up in 1 yr   Signed electronically by: Stefano Redgie Butts, MD  [facility] Endocrinology  [facility] [address] [place], [place] [address] Phone: [phone] FAX: [phone]   CC: Norleen Lynwood ORN, MD [address] [place] [address] Phone: [phone] Fax: [phone]   Return to Endocrinology clinic as below: Future Appointments  Date Time Provider  Department Center  01/10/2025  1:00 PM Elnor Lauraine BRAVO, NP [facility]         "

## 2024-10-10 NOTE — Patient Instructions (Addendum)
 Please Contact [facility] [facility] office to schedule a bone density [phone] located at [address] [place]      Take multivitamin daily  Take Calcium  carbonate 600 mg, 1 tablet daily

## 2024-10-11 ENCOUNTER — Ambulatory Visit: Payer: Self-pay | Admitting: Internal Medicine

## 2024-10-11 LAB — VITAMIN D 25 HYDROXY (VIT D DEFICIENCY, FRACTURES): Vit D, 25-Hydroxy: 48 ng/mL (ref 30–100)

## 2024-10-11 LAB — PTH, INTACT AND CALCIUM
Calcium: 10 mg/dL (ref 8.6–10.3)
PTH: 23 pg/mL (ref 16–77)

## 2024-10-18 ENCOUNTER — Ambulatory Visit
Admission: RE | Admit: 2024-10-18 | Discharge: 2024-10-18 | Disposition: A | Discharge status: Home / Self Care | Source: Ambulatory Visit | Attending: Internal Medicine | Admitting: Internal Medicine

## 2024-10-18 DIAGNOSIS — M810 Age-related osteoporosis without current pathological fracture: Secondary | ICD-10-CM

## 2025-01-10 ENCOUNTER — Encounter: Admitting: Nurse Practitioner

## 2025-04-29 ENCOUNTER — Ambulatory Visit: Admitting: Internal Medicine

## 2025-10-09 ENCOUNTER — Ambulatory Visit: Admitting: Internal Medicine
